# Patient Record
Sex: Female | Born: 1965
Health system: Southern US, Community
[De-identification: ages and names within clinical notes are randomized; demographics above are authoritative.]

## PROBLEM LIST (undated history)

## (undated) DIAGNOSIS — H4010X0 Unspecified open-angle glaucoma, stage unspecified: Secondary | ICD-10-CM

## (undated) DIAGNOSIS — I639 Cerebral infarction, unspecified: Secondary | ICD-10-CM

## (undated) DIAGNOSIS — E119 Type 2 diabetes mellitus without complications: Secondary | ICD-10-CM

## (undated) DIAGNOSIS — I10 Essential (primary) hypertension: Secondary | ICD-10-CM

## (undated) HISTORY — DX: Unspecified open-angle glaucoma, stage unspecified: H40.10X0

## (undated) HISTORY — DX: Cerebral infarction, unspecified: I63.9

## (undated) HISTORY — DX: Type 2 diabetes mellitus without complications: E11.9

## (undated) HISTORY — DX: Essential (primary) hypertension: I10

## (undated) HISTORY — PX: ABDOMINAL HYSTERECTOMY: SHX81

---

## 1997-10-29 ENCOUNTER — Other Ambulatory Visit: Admission: RE | Admit: 1997-10-29 | Discharge: 1997-10-29 | Payer: Self-pay | Admitting: Family Medicine

## 1998-09-18 ENCOUNTER — Encounter: Admission: RE | Admit: 1998-09-18 | Discharge: 1998-12-17 | Payer: Self-pay | Admitting: Emergency Medicine

## 2014-04-25 ENCOUNTER — Other Ambulatory Visit: Payer: Self-pay | Admitting: Family Medicine

## 2014-04-25 DIAGNOSIS — D259 Leiomyoma of uterus, unspecified: Secondary | ICD-10-CM

## 2014-04-30 ENCOUNTER — Ambulatory Visit
Admission: RE | Admit: 2014-04-30 | Discharge: 2014-04-30 | Disposition: A | Discharge status: Home / Self Care | Payer: 59 | Source: Ambulatory Visit | Attending: Family Medicine | Admitting: Family Medicine

## 2014-04-30 DIAGNOSIS — D259 Leiomyoma of uterus, unspecified: Secondary | ICD-10-CM

## 2014-04-30 IMAGING — US US TRANSVAGINAL NON-OB
1 series · 14 of 25 positions shown · non-contrast
Comparison: None

CLINICAL DATA: Uterine fibroids.  LMP [DATE].

EXAM:
TRANSABDOMINAL AND TRANSVAGINAL ULTRASOUND OF PELVIS
TECHNIQUE: Both transabdominal and transvaginal ultrasound examinations of the
pelvis were performed. Transabdominal technique was performed for
global imaging of the pelvis including uterus, ovaries, adnexal
regions, and pelvic cul-de-sac. It was necessary to proceed with
endovaginal exam following the transabdominal exam to visualize the
endometrium and ovaries.

[Series 1: us transvaginal non-ob · 0.27mm/px · 14 of 57 slices shown]
[im 1/57]
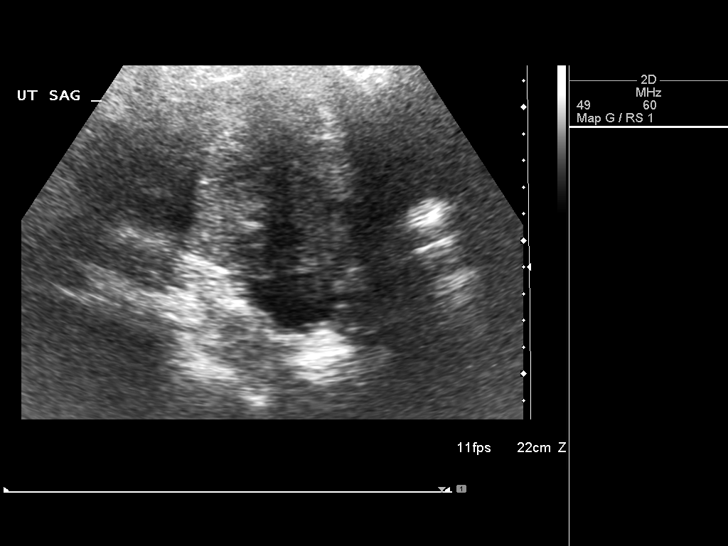
[im 5/57]
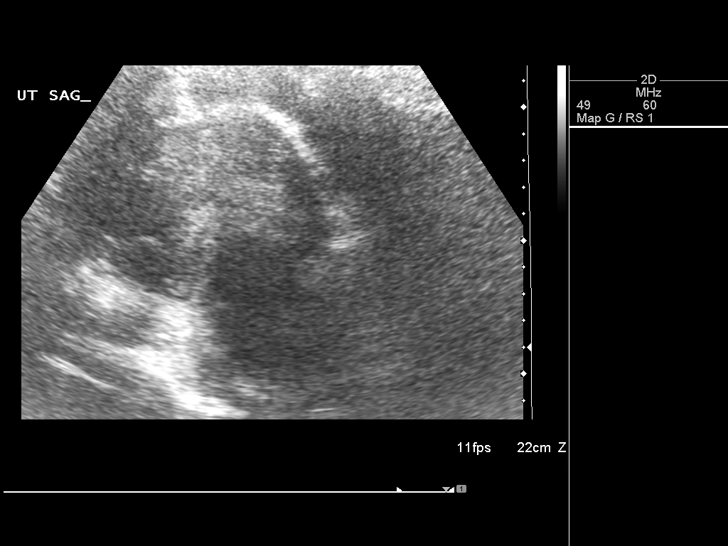
[im 10/57]
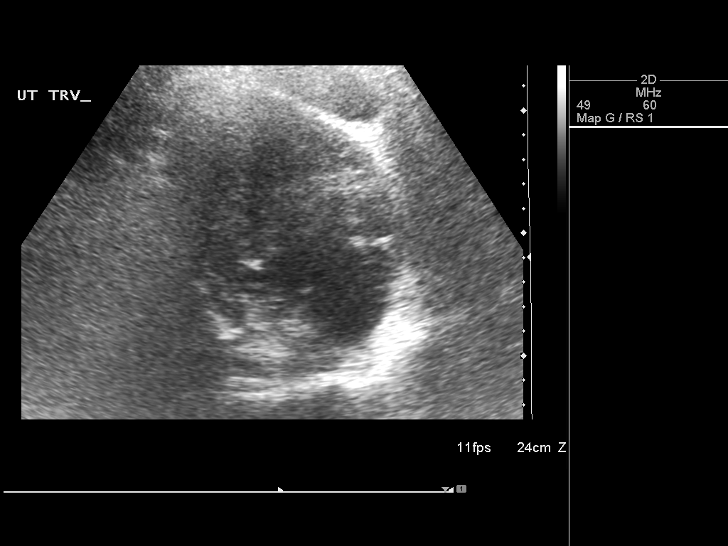
[im 15/57]
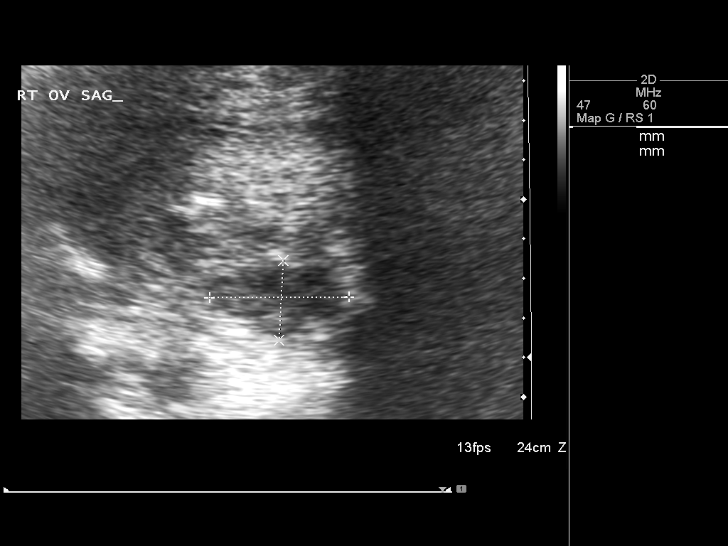
[im 19/57]
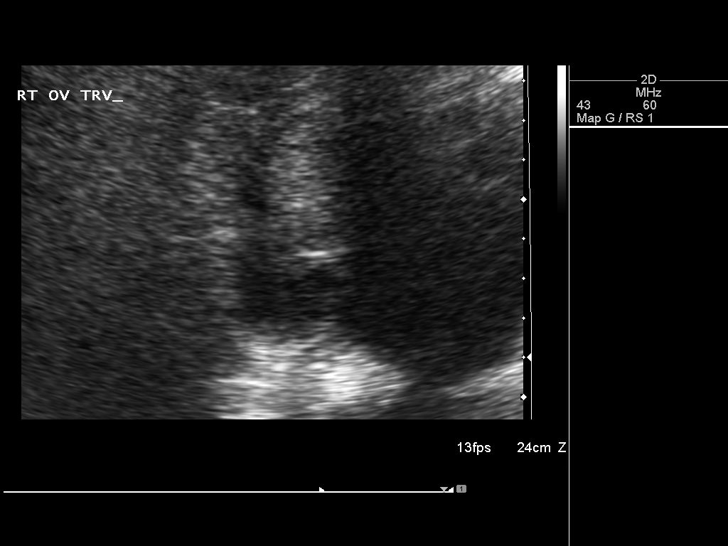
[im 22/57]
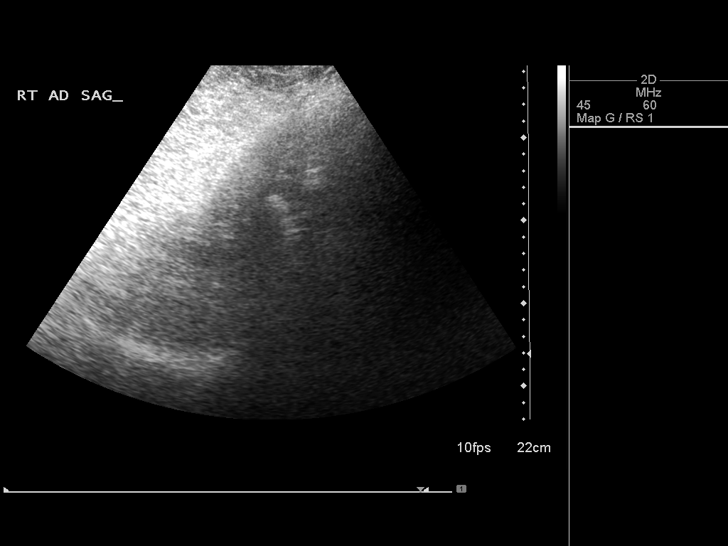
[im 26/57]
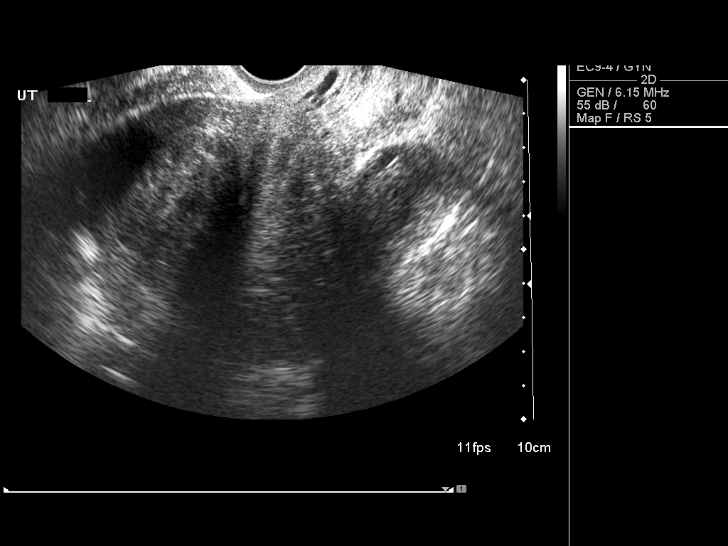
[im 31/57]
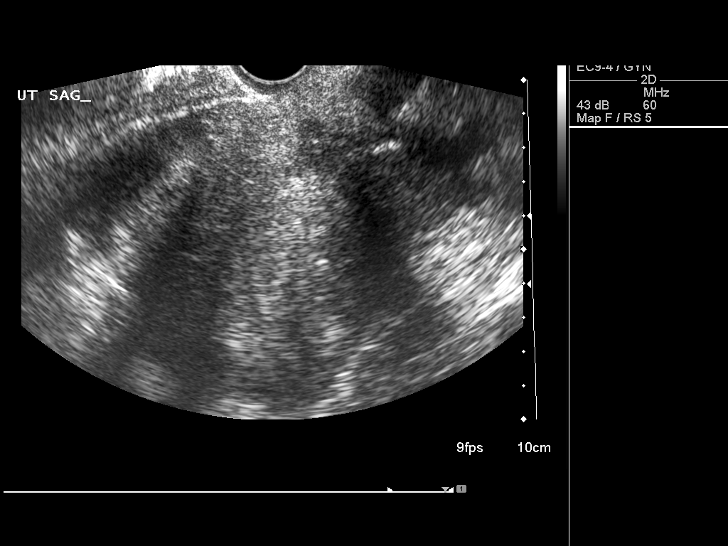
[im 36/57]
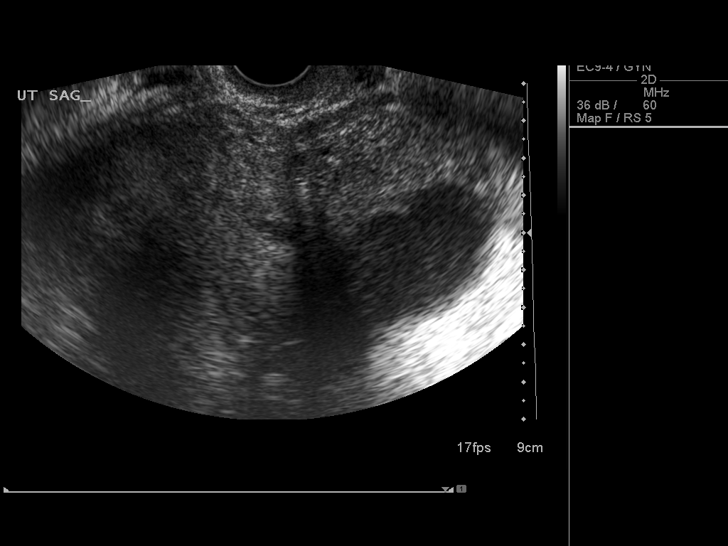
[im 38/57]
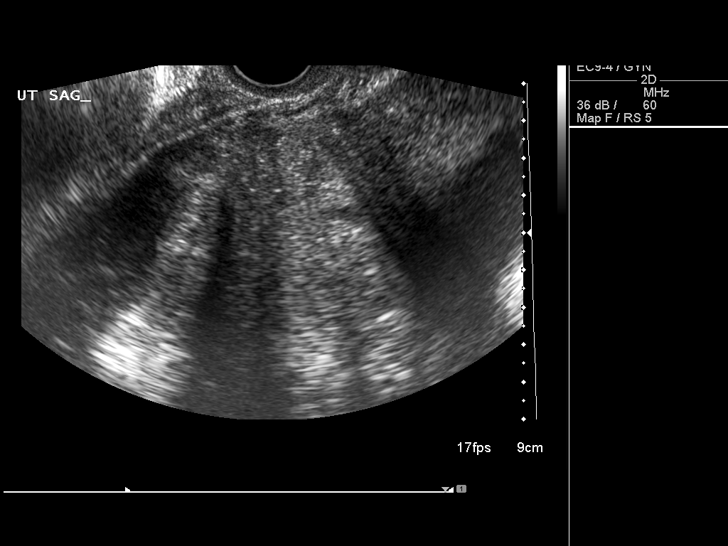
[im 43/57]
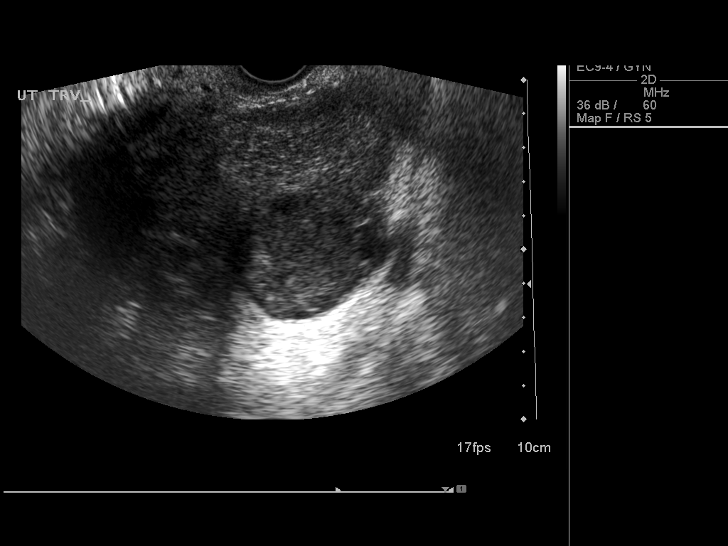
[im 47/57]
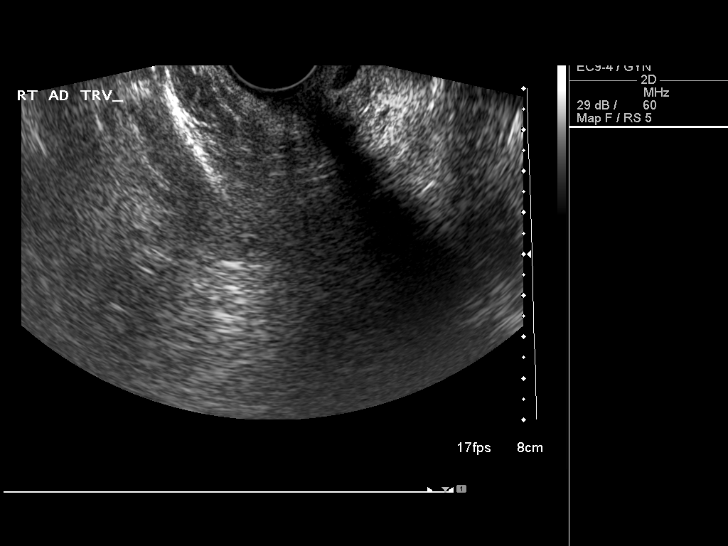
[im 52/57]
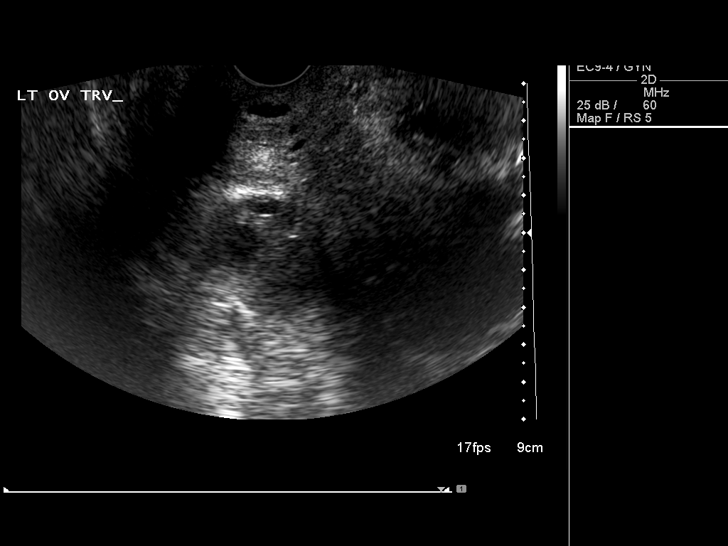
[im 57/57]
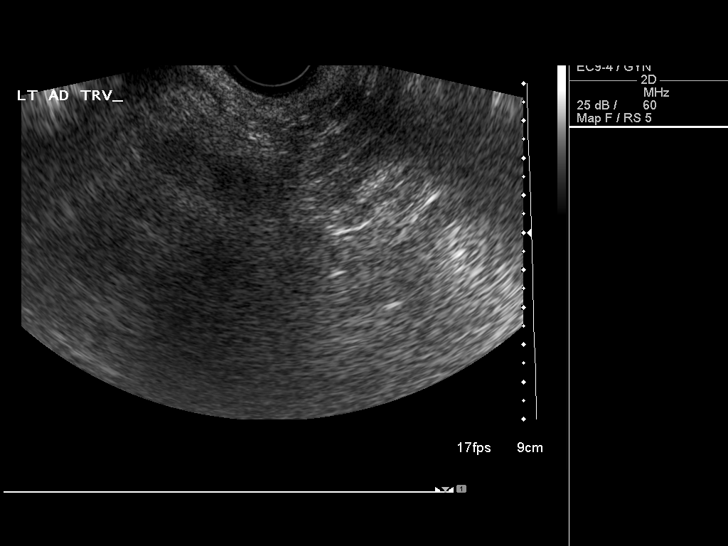

[14 of 25 positions shown; findings below may reference images not displayed]

FINDINGS: Uterus

Measurements: 11.5 x 8.5 x 9.3 cm. A large posterior uterine fibroid
with on area of cystic degeneration is seen in the posterior uterine
corpus which measures 8.3 x 6.5 x 7.8 cm. No other definite fibroids
identified.

Endometrium

Thickness: 9 mm.  No focal abnormality visualized.

Right ovary

Measurements: 3.5 x 2.0 x 2.6 cm. Normal appearance/no adnexal mass.

Left ovary

Measurements: 4.3 x 3.1 x 2.8 cm. Normal appearance/no adnexal mass.

Other findings

No free fluid.
IMPRESSION: Large posterior uterine fibroid measuring 8.3 cm.

Normal appearance of both ovaries.

## 2014-05-08 ENCOUNTER — Other Ambulatory Visit: Payer: Self-pay | Admitting: Family Medicine

## 2014-05-08 ENCOUNTER — Ambulatory Visit
Admission: RE | Admit: 2014-05-08 | Discharge: 2014-05-08 | Disposition: A | Discharge status: Home / Self Care | Payer: 59 | Source: Ambulatory Visit | Attending: Family Medicine | Admitting: Family Medicine

## 2014-05-08 DIAGNOSIS — R109 Unspecified abdominal pain: Secondary | ICD-10-CM

## 2014-05-08 IMAGING — CR DG ABDOMEN 1V
2 series · 2 of 2 positions shown · non-contrast
Comparison: None.

CLINICAL DATA: Left flank pain for 1 month, rule out kidney stone

EXAM:
ABDOMEN - 1 VIEW

[t abdomen supine (1 of 2)]
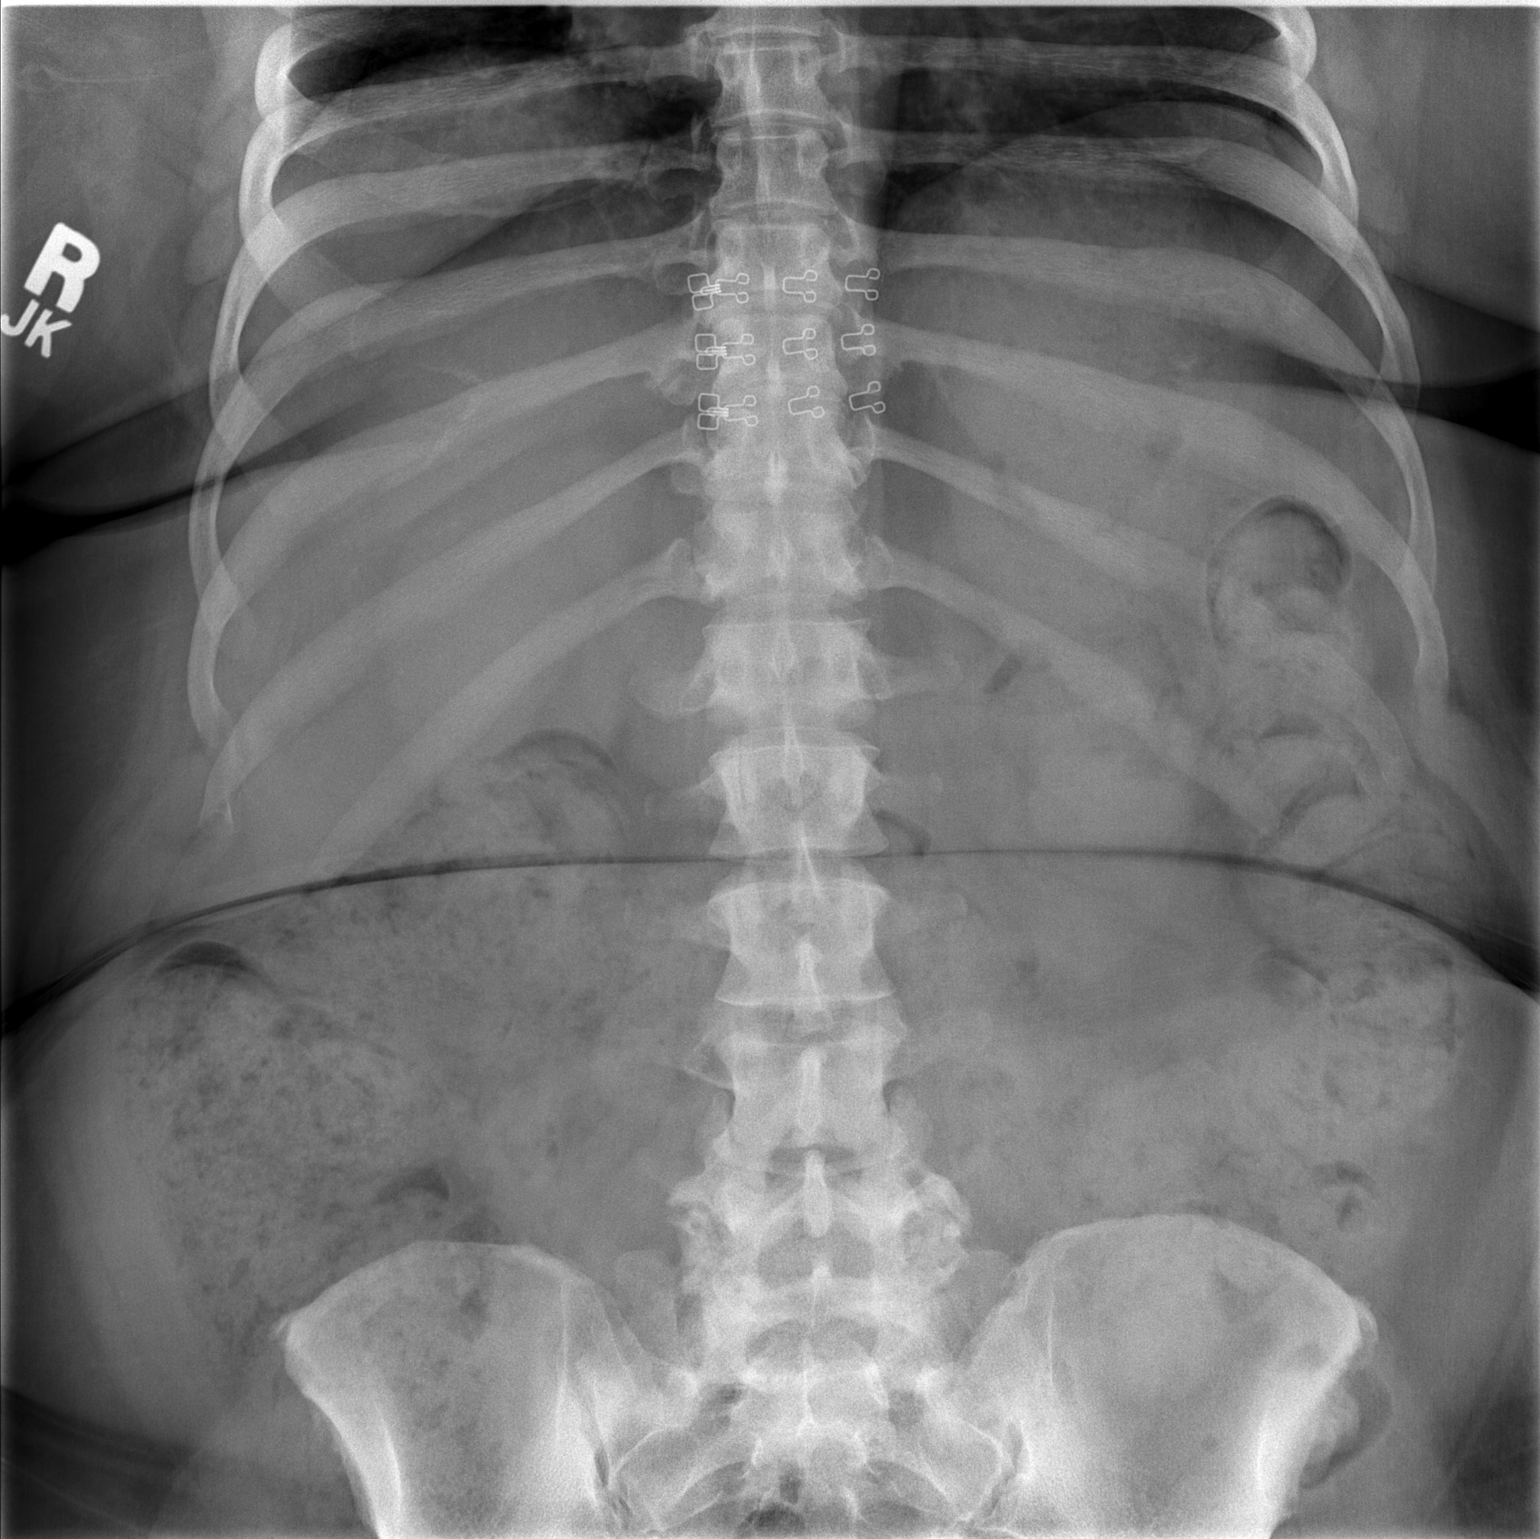

[t abdomen supine (2 of 2)]
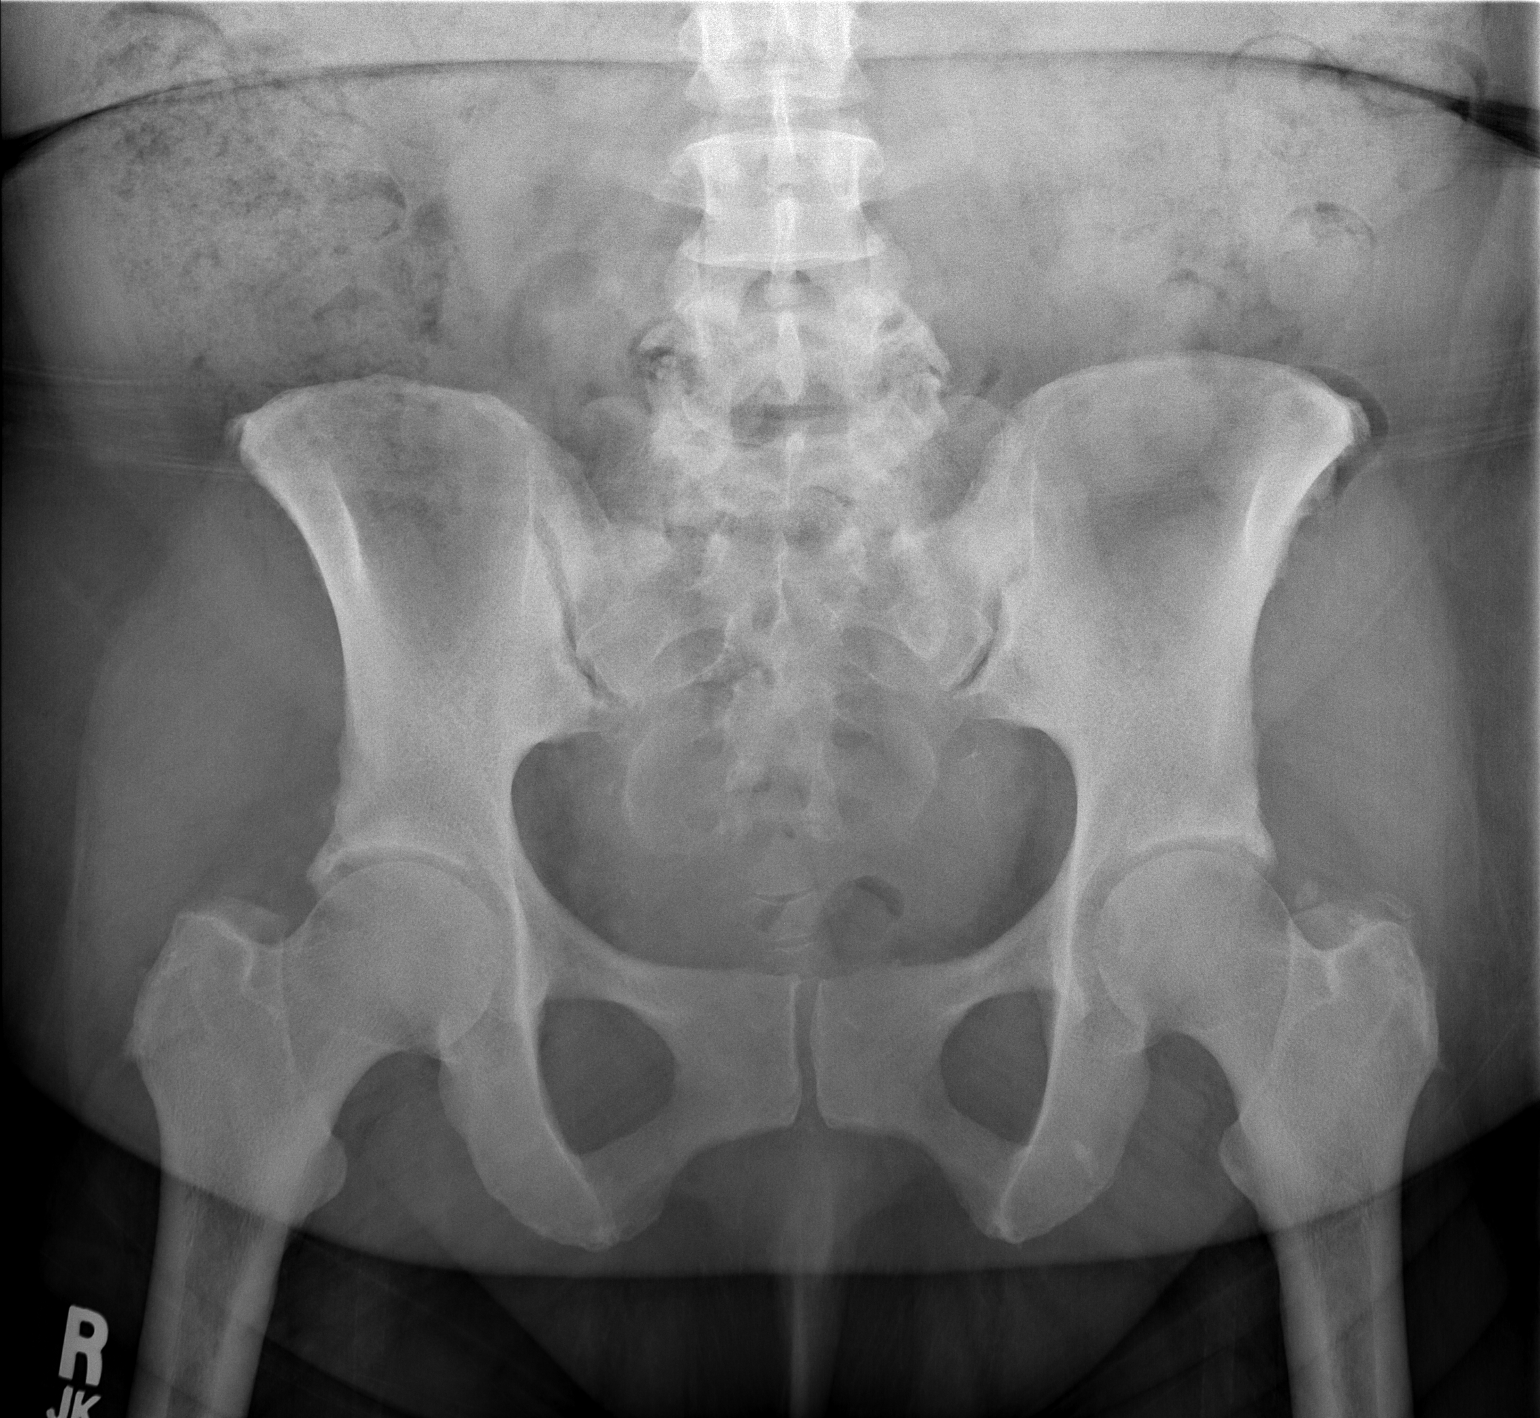

[2 of 2 positions shown; findings below may reference images not displayed]

FINDINGS: There is nonspecific nonobstructive bowel gas pattern. No definite
renal or ureteral calcifications. There is abundant stool throughout
the colon. Kidneys are partially obscured.
IMPRESSION: Nonspecific nonobstructive bowel gas pattern. Abundant stool
throughout the colon. No definite renal or ureteral calcifications.

## 2014-08-24 NOTE — Patient Instructions (Addendum)
   Your procedure is scheduled on:  Tuesday, May 31  Enter through the Main Entrance of [facility] at: 6 AM Pick up the phone at the desk and dial [phone] and inform us of your arrival.  Please call this number if you have any problems the morning of surgery: [phone]  Remember: Do not eat or drink after midnight: Monday Take these medicines the morning of surgery with a SIP OF WATER:   Cozaar and zyrtec if needed.  Do not take any diabetes medications on day of surgery.  Half you Monday night insulin dose to        .  We will check your sugar upon arrival to Short Stay on day of surgery.   Do not wear jewelry, make-up, or FINGER nail polish No metal in your hair or on your body. Do not wear lotions, powders, perfumes.  You may wear deodorant.  Do not bring valuables to the hospital. Contacts, dentures or bridgework may not be worn into surgery.  Leave suitcase in the car. After Surgery it may be brought to your room. For patients being admitted to the hospital, checkout time is 11:00am the day of discharge.

## 2014-08-27 ENCOUNTER — Inpatient Hospital Stay (HOSPITAL_COMMUNITY)
Admission: RE | Admit: 2014-08-27 | Discharge: 2014-08-27 | Disposition: A | Discharge status: Home / Self Care | Payer: 59 | Source: Ambulatory Visit

## 2014-09-03 ENCOUNTER — Encounter (HOSPITAL_COMMUNITY): Admission: RE | Payer: Self-pay | Source: Ambulatory Visit

## 2014-09-03 ENCOUNTER — Inpatient Hospital Stay (HOSPITAL_COMMUNITY): Admission: RE | Admit: 2014-09-03 | Payer: 59 | Source: Ambulatory Visit | Admitting: Obstetrics and Gynecology

## 2014-09-03 SURGERY — HYSTERECTOMY, ABDOMINAL
Anesthesia: Choice

## 2016-11-09 ENCOUNTER — Encounter: Payer: BLUE CROSS/BLUE SHIELD | Attending: Internal Medicine | Admitting: Registered"

## 2016-11-09 ENCOUNTER — Encounter: Payer: Self-pay | Admitting: Registered"

## 2016-11-09 DIAGNOSIS — I10 Essential (primary) hypertension: Secondary | ICD-10-CM | POA: Insufficient documentation

## 2016-11-09 DIAGNOSIS — E118 Type 2 diabetes mellitus with unspecified complications: Secondary | ICD-10-CM

## 2016-11-09 DIAGNOSIS — E119 Type 2 diabetes mellitus without complications: Secondary | ICD-10-CM | POA: Diagnosis not present

## 2016-11-09 DIAGNOSIS — Z713 Dietary counseling and surveillance: Secondary | ICD-10-CM | POA: Diagnosis present

## 2016-11-09 NOTE — Progress Notes (Signed)
Diabetes Self-Management Education  Visit Type: First/Initial  Appt. Start Time: 0930 Appt. End Time: 1055  11/12/2016  Ms. Sharon Blankenship, identified by name and date of birth, is a 51 y.o. female with a diagnosis of Diabetes: Type 2.   ASSESSMENT Pt states she gave up Dr. Malachi Bonds and trying to follow low carb/low fat diet. 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 Pt states she lives with sister and they have family dinner on Sundays. Pt reports she can't digest fat when eating too much at time.  Weight: pt states she has always been larger wearing sizes 12-20. Pt states on her dad's side of the family the women have larger bodies.   Patient is taking Insulin & Trulicy; pt states metformin caused side effects.  Pt states it is hard to be active because her feet stay swollen and it is painful. Pt states she thinks medication may be playing a part in it.     Diabetes Self-Management Education - 11/09/16 0944      Visit Information   Visit Type First/Initial     Initial Visit   Diabetes Type Type 2   Are you currently following a meal plan? Yes   What type of meal plan do you follow? low fat/low carb/low sugar   Are you taking your medications as prescribed? No  takes DM meds   Date Diagnosed at 23 years     Health Coping   How would you rate your overall health? Good     Psychosocial Assessment   Patient Belief/Attitude about Diabetes Defeat/Burnout   How often do you need to have someone help you when you read instructions, pamphlets, or other written materials from your doctor or pharmacy? 1 - Never   What is the last grade level you completed in school? vocational, some college     Complications   Last HgB A1C per patient/outside source 8.9 %  per patient   How often do you check your blood sugar? 1-2 times/day   Have you had a dilated eye exam in the past 12 months? No   Have you had a dental exam in the past 12  months? No   Are you checking your feet? Yes   How many days per week are you checking your feet? 6     Dietary Intake   Breakfast lemon water and cereal OR fruit, ww whole, fruit, grits   Snack (morning) chips  ("worst enemy")   Lunch chicken or fish, eating out rotisserie chicken, greens   Snack (afternoon) nuts, popcorn, dried fruit used ,    Dinner fast food chick fil-a, chicken sandwhich, unsweet, waffle fries    Snack (evening) fruit, popcorn, sugar free popsciles, lemonade w equal   Beverage(s) water, unsweet tea     Exercise   How many days per week to you exercise? 3   How many minutes per day do you exercise? 30   Total minutes per week of exercise 90     Patient Education   Previous Diabetes Education Yes (please comment)  years ago   Disease state  Factors that contribute to the development of diabetes   Nutrition management  Role of diet in the treatment of diabetes and the relationship between the three main macronutrients and blood glucose level     Individualized Goals (developed by patient)   Nutrition General guidelines for healthy choices and portions discussed  Outcomes   Expected Outcomes Demonstrated interest in learning. Expect positive outcomes   Future DMSE 4-6 wks   Program Status Not Completed    Individualized Plan for Diabetes Self-Management Training:   Learning Objective:  Patient will have a greater understanding of diabetes self-management. Patient education plan is to attend individual and/or group sessions per assessed needs and concerns.  Patient Instructions  Consider using Fairlife milk on your Cheerios Aim to eat balanced meals Documentary "Embrace" is a great take on body image Consider including more snacks during the day, use the snack sheet  Expected Outcomes:  Demonstrated interest in learning. Expect positive outcomes  Education material provided: My Plate and Snack sheet, PCOS Tips & Resources, what is inositol?  If  problems or questions, patient to contact team via:  Phone and  MyChart  Future DSME appointment: 4-6 wks

## 2016-11-09 NOTE — Patient Instructions (Signed)
Consider using Fairlife milk on your Cheerios Aim to eat balanced meals Documentary "Embrace" is a great take on body image Consider including more snacks during the day, use the snack sheet

## 2016-11-12 ENCOUNTER — Encounter: Payer: Self-pay | Admitting: Registered"

## 2016-12-14 ENCOUNTER — Ambulatory Visit: Payer: BLUE CROSS/BLUE SHIELD | Admitting: Registered"

## 2018-06-21 ENCOUNTER — Encounter: Payer: Self-pay | Admitting: Gastroenterology

## 2020-01-18 ENCOUNTER — Emergency Department (HOSPITAL_COMMUNITY)
Admission: EM | Admit: 2020-01-18 | Discharge: 2020-01-19 | Disposition: A | Discharge status: Home / Self Care | Payer: BLUE CROSS/BLUE SHIELD | Attending: Emergency Medicine | Admitting: Emergency Medicine

## 2020-01-18 ENCOUNTER — Other Ambulatory Visit: Payer: Self-pay

## 2020-01-18 ENCOUNTER — Encounter (HOSPITAL_COMMUNITY): Payer: Self-pay | Admitting: Emergency Medicine

## 2020-01-18 DIAGNOSIS — H5712 Ocular pain, left eye: Secondary | ICD-10-CM | POA: Insufficient documentation

## 2020-01-18 DIAGNOSIS — R519 Headache, unspecified: Secondary | ICD-10-CM | POA: Diagnosis not present

## 2020-01-18 DIAGNOSIS — Z5321 Procedure and treatment not carried out due to patient leaving prior to being seen by health care provider: Secondary | ICD-10-CM | POA: Insufficient documentation

## 2020-01-18 NOTE — ED Triage Notes (Addendum)
Pt to ED with c/o not being able to see out of left eye.  Pt sts she was at urgent care yesterday and dx with sinus infection and given Rx  Pt st's she woke up today and cant see out of left eye. Also c/o headache

## 2020-01-18 NOTE — ED Notes (Signed)
No answer for vitals x3 and not visible in lobby

## 2020-02-19 LAB — HM DIABETES EYE EXAM

## 2020-03-21 ENCOUNTER — Emergency Department (HOSPITAL_COMMUNITY): Payer: BLUE CROSS/BLUE SHIELD

## 2020-03-21 ENCOUNTER — Inpatient Hospital Stay (HOSPITAL_COMMUNITY)
Admission: EM | Admit: 2020-03-21 | Discharge: 2020-03-23 | DRG: 065 | Disposition: A | Discharge status: Home Health | Payer: BLUE CROSS/BLUE SHIELD | Attending: Internal Medicine | Admitting: Internal Medicine

## 2020-03-21 ENCOUNTER — Other Ambulatory Visit: Payer: Self-pay

## 2020-03-21 DIAGNOSIS — Z79899 Other long term (current) drug therapy: Secondary | ICD-10-CM

## 2020-03-21 DIAGNOSIS — Z20822 Contact with and (suspected) exposure to covid-19: Secondary | ICD-10-CM | POA: Diagnosis present

## 2020-03-21 DIAGNOSIS — G8191 Hemiplegia, unspecified affecting right dominant side: Secondary | ICD-10-CM | POA: Diagnosis present

## 2020-03-21 DIAGNOSIS — I639 Cerebral infarction, unspecified: Secondary | ICD-10-CM | POA: Diagnosis present

## 2020-03-21 DIAGNOSIS — R279 Unspecified lack of coordination: Secondary | ICD-10-CM | POA: Diagnosis present

## 2020-03-21 DIAGNOSIS — R29898 Other symptoms and signs involving the musculoskeletal system: Secondary | ICD-10-CM

## 2020-03-21 DIAGNOSIS — Z6839 Body mass index (BMI) 39.0-39.9, adult: Secondary | ICD-10-CM | POA: Diagnosis not present

## 2020-03-21 DIAGNOSIS — R29701 NIHSS score 1: Secondary | ICD-10-CM | POA: Diagnosis present

## 2020-03-21 DIAGNOSIS — R269 Unspecified abnormalities of gait and mobility: Secondary | ICD-10-CM | POA: Diagnosis present

## 2020-03-21 DIAGNOSIS — E872 Acidosis: Secondary | ICD-10-CM | POA: Diagnosis present

## 2020-03-21 DIAGNOSIS — E876 Hypokalemia: Secondary | ICD-10-CM | POA: Diagnosis present

## 2020-03-21 DIAGNOSIS — I63522 Cerebral infarction due to unspecified occlusion or stenosis of left anterior cerebral artery: Secondary | ICD-10-CM | POA: Diagnosis present

## 2020-03-21 DIAGNOSIS — I10 Essential (primary) hypertension: Secondary | ICD-10-CM | POA: Diagnosis present

## 2020-03-21 DIAGNOSIS — E860 Dehydration: Secondary | ICD-10-CM | POA: Diagnosis present

## 2020-03-21 DIAGNOSIS — I959 Hypotension, unspecified: Secondary | ICD-10-CM | POA: Diagnosis present

## 2020-03-21 DIAGNOSIS — H402290 Chronic angle-closure glaucoma, unspecified eye, stage unspecified: Secondary | ICD-10-CM | POA: Diagnosis present

## 2020-03-21 DIAGNOSIS — T502X5A Adverse effect of carbonic-anhydrase inhibitors, benzothiadiazides and other diuretics, initial encounter: Secondary | ICD-10-CM | POA: Diagnosis present

## 2020-03-21 DIAGNOSIS — Z823 Family history of stroke: Secondary | ICD-10-CM

## 2020-03-21 DIAGNOSIS — R4781 Slurred speech: Secondary | ICD-10-CM | POA: Diagnosis present

## 2020-03-21 DIAGNOSIS — Z7984 Long term (current) use of oral hypoglycemic drugs: Secondary | ICD-10-CM

## 2020-03-21 DIAGNOSIS — Z8249 Family history of ischemic heart disease and other diseases of the circulatory system: Secondary | ICD-10-CM | POA: Diagnosis not present

## 2020-03-21 DIAGNOSIS — I6389 Other cerebral infarction: Secondary | ICD-10-CM | POA: Diagnosis not present

## 2020-03-21 DIAGNOSIS — I672 Cerebral atherosclerosis: Secondary | ICD-10-CM | POA: Diagnosis present

## 2020-03-21 DIAGNOSIS — I63412 Cerebral infarction due to embolism of left middle cerebral artery: Secondary | ICD-10-CM | POA: Diagnosis not present

## 2020-03-21 DIAGNOSIS — Z90710 Acquired absence of both cervix and uterus: Secondary | ICD-10-CM

## 2020-03-21 DIAGNOSIS — Z880 Allergy status to penicillin: Secondary | ICD-10-CM

## 2020-03-21 DIAGNOSIS — E785 Hyperlipidemia, unspecified: Secondary | ICD-10-CM | POA: Diagnosis present

## 2020-03-21 DIAGNOSIS — Z833 Family history of diabetes mellitus: Secondary | ICD-10-CM

## 2020-03-21 DIAGNOSIS — N179 Acute kidney failure, unspecified: Secondary | ICD-10-CM | POA: Diagnosis present

## 2020-03-21 DIAGNOSIS — E119 Type 2 diabetes mellitus without complications: Secondary | ICD-10-CM | POA: Diagnosis present

## 2020-03-21 DIAGNOSIS — I63512 Cerebral infarction due to unspecified occlusion or stenosis of left middle cerebral artery: Secondary | ICD-10-CM | POA: Diagnosis not present

## 2020-03-21 DIAGNOSIS — Z794 Long term (current) use of insulin: Secondary | ICD-10-CM | POA: Diagnosis not present

## 2020-03-21 LAB — HEMOGLOBIN A1C
Hgb A1c MFr Bld: 6.6 % — ABNORMAL HIGH (ref 4.8–5.6)
Mean Plasma Glucose: 142.72 mg/dL

## 2020-03-21 LAB — LIPID PANEL
Cholesterol: 222 mg/dL — ABNORMAL HIGH (ref 0–200)
HDL: 34 mg/dL — ABNORMAL LOW (ref >40)
LDL Cholesterol: 159 mg/dL — ABNORMAL HIGH (ref 0–99)
Total CHOL/HDL Ratio: 6.5 RATIO
Triglycerides: 144 mg/dL (ref <150)
VLDL: 29 mg/dL (ref 0–40)

## 2020-03-21 LAB — CBC
HCT: 32.7 % — ABNORMAL LOW (ref 36.0–46.0)
Hemoglobin: 11 g/dL — ABNORMAL LOW (ref 12.0–15.0)
MCH: 27.2 pg (ref 26.0–34.0)
MCHC: 33.6 g/dL (ref 30.0–36.0)
MCV: 80.9 fL (ref 80.0–100.0)
Platelets: 313 10*3/uL (ref 150–400)
RBC: 4.04 MIL/uL (ref 3.87–5.11)
RDW: 15.1 % (ref 11.5–15.5)
WBC: 7.6 10*3/uL (ref 4.0–10.5)
nRBC: 0 % (ref 0.0–0.2)

## 2020-03-21 LAB — TROPONIN I (HIGH SENSITIVITY): Troponin I (High Sensitivity): 4 ng/L (ref <18)

## 2020-03-21 LAB — RESP PANEL BY RT-PCR (FLU A&B, COVID) ARPGX2
Influenza A by PCR: NEGATIVE
Influenza B by PCR: NEGATIVE
SARS Coronavirus 2 by RT PCR: NEGATIVE

## 2020-03-21 LAB — BASIC METABOLIC PANEL
Anion gap: 14 (ref 5–15)
BUN: 22 mg/dL — ABNORMAL HIGH (ref 6–20)
CO2: 13 mmol/L — ABNORMAL LOW (ref 22–32)
Calcium: 9.6 mg/dL (ref 8.9–10.3)
Chloride: 114 mmol/L — ABNORMAL HIGH (ref 98–111)
Creatinine, Ser: 1.47 mg/dL — ABNORMAL HIGH (ref 0.44–1.00)
GFR, Estimated: 43 mL/min — ABNORMAL LOW (ref >60)
Glucose, Bld: 98 mg/dL (ref 70–99)
Potassium: 3.4 mmol/L — ABNORMAL LOW (ref 3.5–5.1)
Sodium: 141 mmol/L (ref 135–145)

## 2020-03-21 LAB — HEPATIC FUNCTION PANEL
ALT: 61 U/L — ABNORMAL HIGH (ref 0–44)
AST: 29 U/L (ref 15–41)
Albumin: 3.7 g/dL (ref 3.5–5.0)
Alkaline Phosphatase: 136 U/L — ABNORMAL HIGH (ref 38–126)
Bilirubin, Direct: <0.1 mg/dL (ref 0.0–0.2)
Total Bilirubin: 0.9 mg/dL (ref 0.3–1.2)
Total Protein: 7.6 g/dL (ref 6.5–8.1)

## 2020-03-21 LAB — CBG MONITORING, ED: Glucose-Capillary: 92 mg/dL (ref 70–99)

## 2020-03-21 LAB — BRAIN NATRIURETIC PEPTIDE: B Natriuretic Peptide: 12.7 pg/mL (ref 0.0–100.0)

## 2020-03-21 IMAGING — MR MR MRA HEAD W/O CM
5 series · 39 of 48 positions shown · IV contrast (gadavist)
Comparison: Head CT same day

CLINICAL DATA: Right worse than left weakness. Left frontal stroke
question by CT.

EXAM:
MRI HEAD WITHOUT AND WITH CONTRAST
MRA HEAD WITHOUT CONTRAST
MRA NECK WITHOUT AND WITH CONTRAST
TECHNIQUE: Multiplanar, multiecho pulse sequences of the brain and surrounding
structures were obtained without and with intravenous contrast.
Angiographic images of the Circle of Willis were obtained using MRA
technique without intravenous contrast. Angiographic images of the
neck were obtained using MRA technique without and with intravenous
contrast. Carotid stenosis measurements (when applicable) are
obtained utilizing NASCET criteria, using the distal internal
carotid diameter as the denominator.
CONTRAST:  10mL GADAVIST GADOBUTROL 1 MMOL/ML IV SOLN

[Series 1: aahead_scout · sagittal · 1.6mm · 1.62mm/px · 19 of 128 slices shown]
[im 1/128]
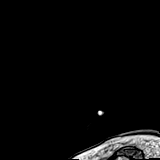
[im 8/128]
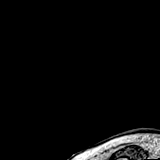
[im 15/128]
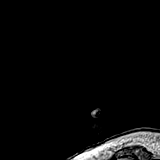
[im 22/128]
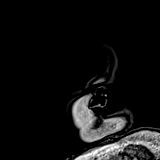
[im 29/128]
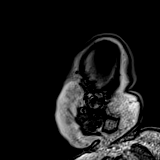
[im 36/128]
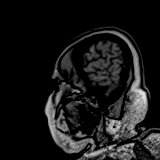
[im 43/128]
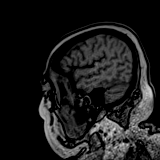
[im 50/128]
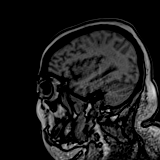
[im 57/128]
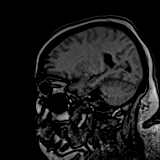
[im 64/128]
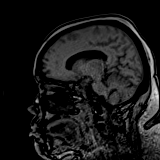
[im 71/128]
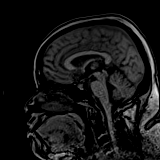
[im 78/128]
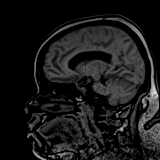
[im 85/128]
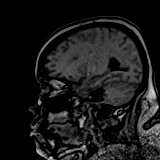
[im 92/128]
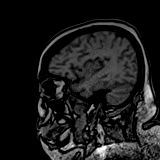
[im 99/128]
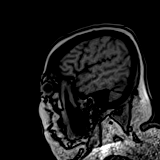
[im 106/128]
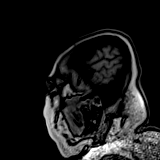
[im 113/128]
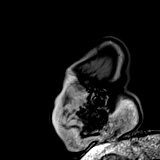
[im 120/128]
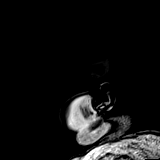
[im 128/128]
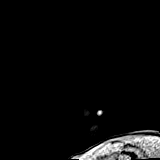

[Series 2: aahead_scout_mpr_sag · sagittal · 1.6mm · 1.60mm/px · 1 of 5 slices shown]
[im 1/5]
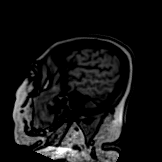

[Series 3: aahead_scout_mpr_cor · coronal · 1.6mm · 1.60mm/px · 1 of 3 slices shown]
[im 1/3]
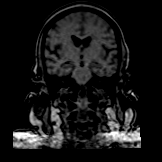

[Series 4: aahead_scout_mpr_tra · axial · 1.6mm · 1.60mm/px · 1 of 3 slices shown]
[im 1/3]
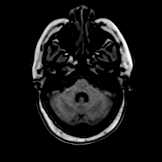

[Series 5: 3d cow · axial · 0.5mm · 0.41mm/px · z∈[-43,+38]mm · 17 of 172 slices shown]
[im 1/172]
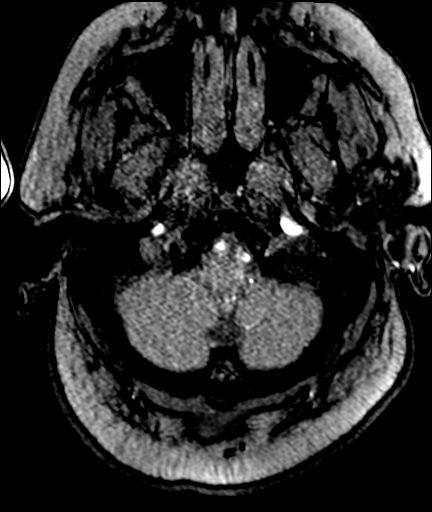
[im 7/172]
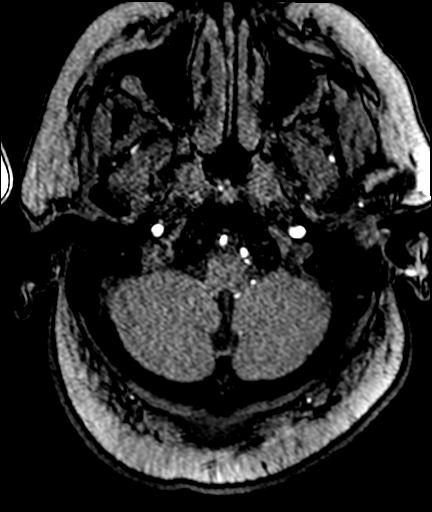
[im 14/172]
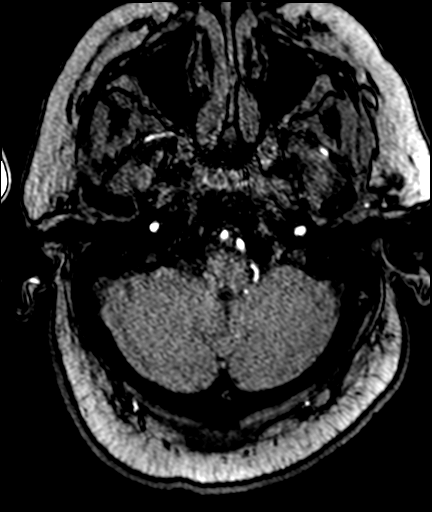
[im 21/172]
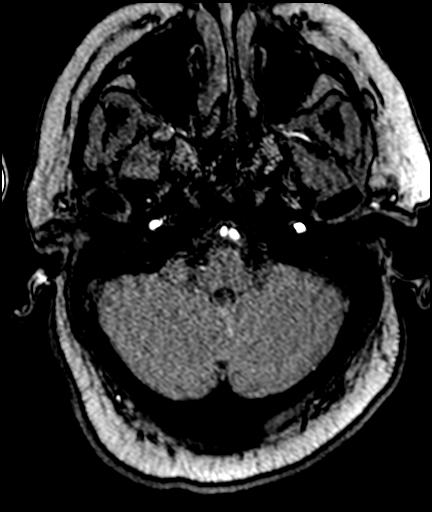
[im 28/172]
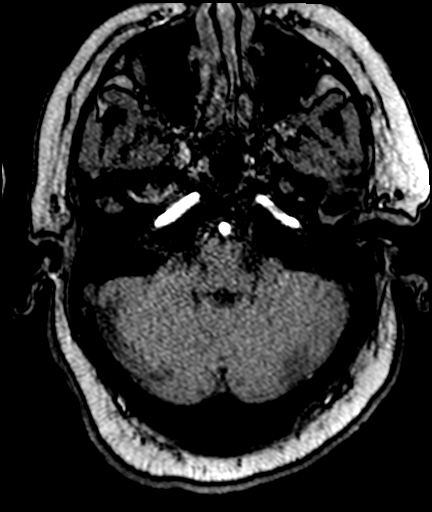
[im 35/172]
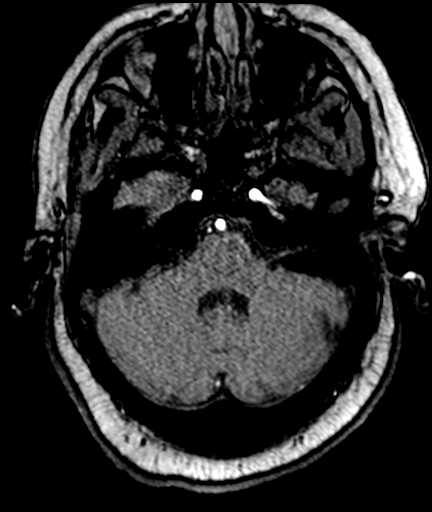
[im 42/172]
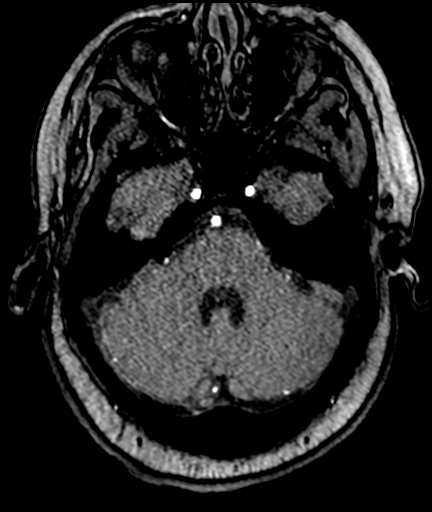
[im 48/172]
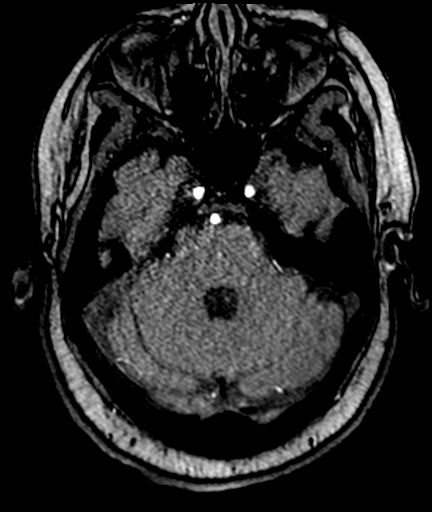
[im 55/172]
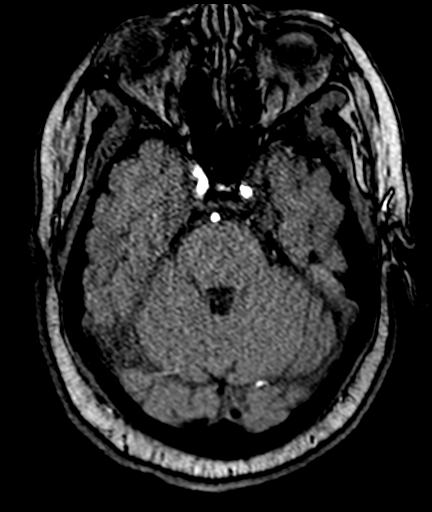
[im 62/172]
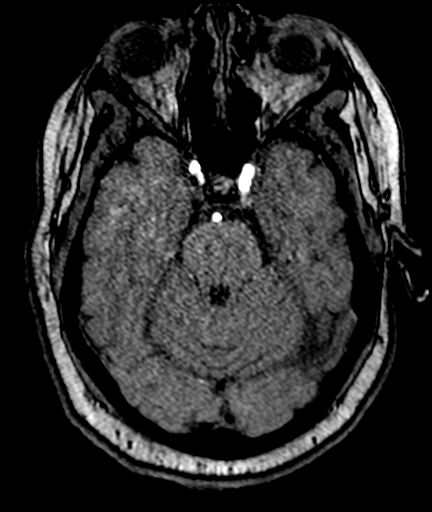
[im 69/172]
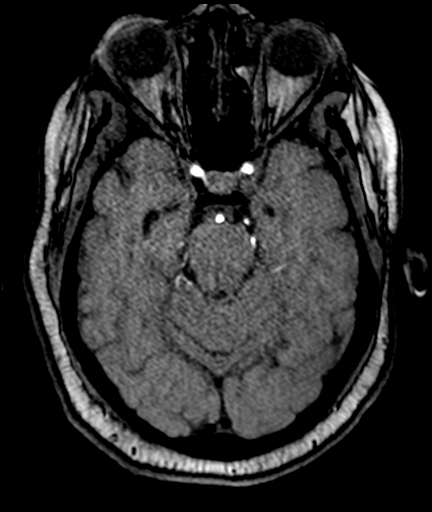
[im 76/172]
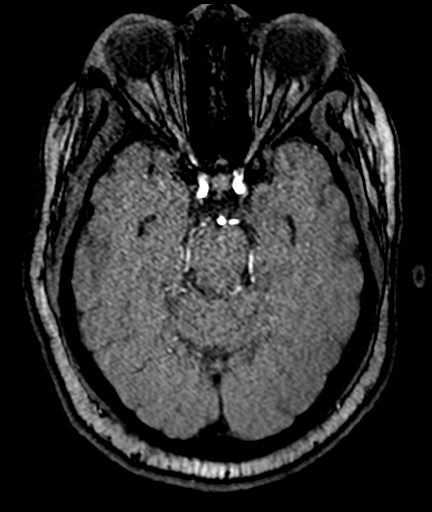
[im 83/172]
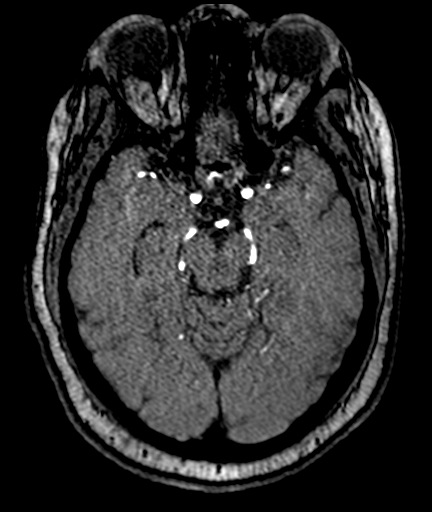
[im 96/172]
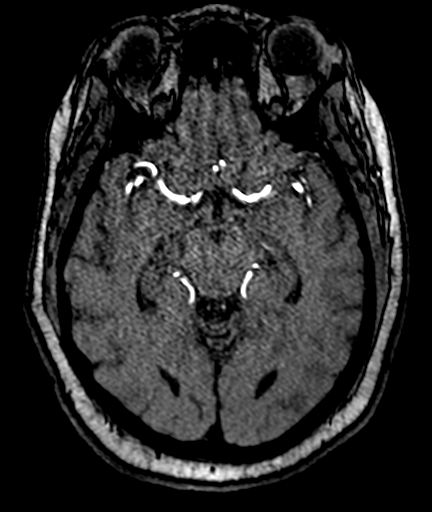
[im 117/172]
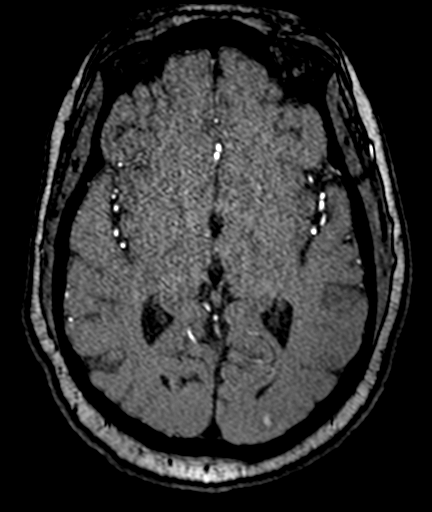
[im 144/172]
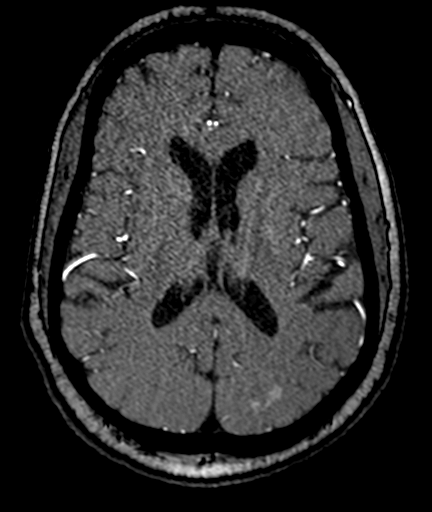
[im 165/172]
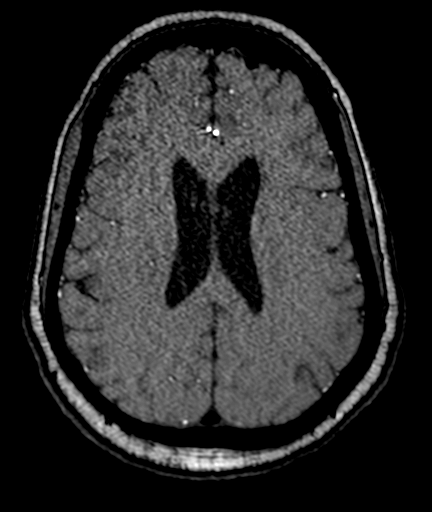

[39 of 48 positions shown; findings below may reference images not displayed]

FINDINGS: MRI HEAD FINDINGS

Brain: Diffusion imaging shows numerous acute infarctions affecting
the cortical and subcortical brain extending from front to back on
the left consistent with watershed distribution infarctions. No
evidence of mass effect or detectable hemorrhage. No acute
infarction in the right hemisphere, brainstem or cerebellum. Mild
chronic small-vessel change affects the hemispheric white matter. No
hydrocephalus or extra-axial collection.

Vascular: Major vessels at the base of the brain show flow.

Skull and upper cervical spine: Negative

Sinuses/Orbits: Clear/normal

Other: None

MRA HEAD FINDINGS

Both internal carotid arteries are patent through the skull base and
siphon regions. On the right, the anterior and middle cerebral
vessels are patent. Question mild stenosis of the proximal M2
branches on the right. On the left, the anterior cerebral artery is
widely patent. The left M1 segment is widely patent. There is severe
narrowing of the proximal M2 branches on the left. This could be due
to plaque or nonocclusive embolus.

Both vertebral arteries widely patent to the basilar. No basilar
stenosis. Stenotic change of the right superior cerebellar artery.
Left superior cerebellar artery is normal. Both posterior cerebral
arteries are patent. Some atherosclerotic irregularity of more
distal branch vessels, right more than left.

MRA NECK FINDINGS

Branching pattern from the arch is normal. Common origin of the
innominate artery and left common carotid artery. Both common
carotid arteries are widely patent to the bifurcation. Minimal
atherosclerotic change at both carotid bifurcations but no stenosis.
Both cervical internal carotid arteries are normal.

Both vertebral arteries are widely patent through the cervical
region to the basilar.
IMPRESSION: 1. Numerous acute infarctions affecting the cortical and subcortical
brain extending from front to back on the left at the junction of
the ACA and MCA territories consistent with watershed distribution
infarctions. No evidence of mass effect or hemorrhage.
2. Severe narrowing of the proximal M2 branch vessels on the left,
which could be due to plaque or nonocclusive embolus.
3. Intracranial MR angiography of the neck vessels shows only
minimal atherosclerotic change at the carotid bifurcations without
flow limiting stenosis or pronounced irregularity.
4. Mild atherosclerotic narrowing of the proximal M2 branches on the
right. Atherosclerotic narrowing of the right superior cerebellar
artery. Mild atherosclerotic irregularity of the more distal PCA
branches right more than left.

## 2020-03-21 IMAGING — CR DG CHEST 2V
2 series · 2 of 2 positions shown · non-contrast
Comparison: None.

CLINICAL DATA: shortness of breath

EXAM:
CHEST - 2 VIEW

[chest pa]
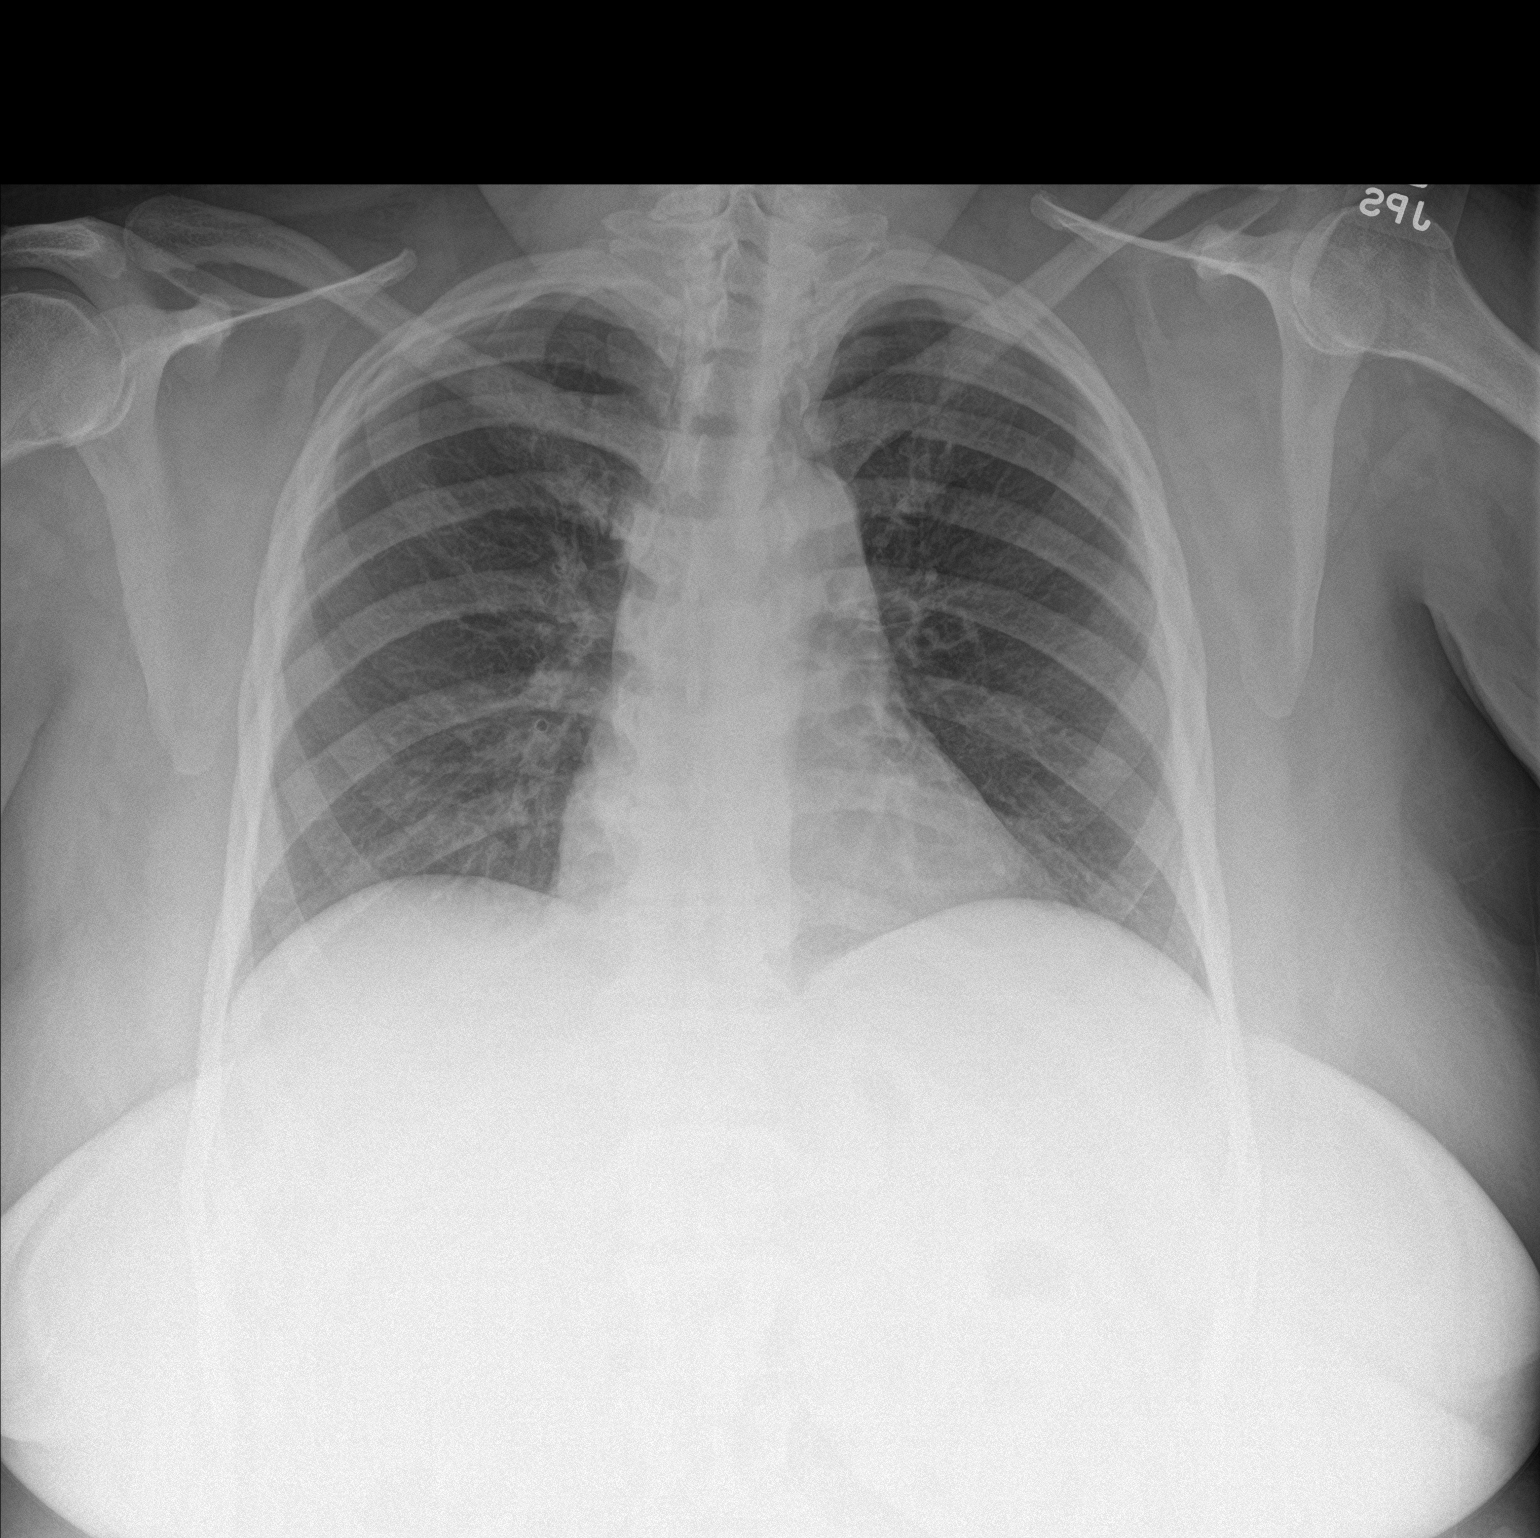

[chest lat]
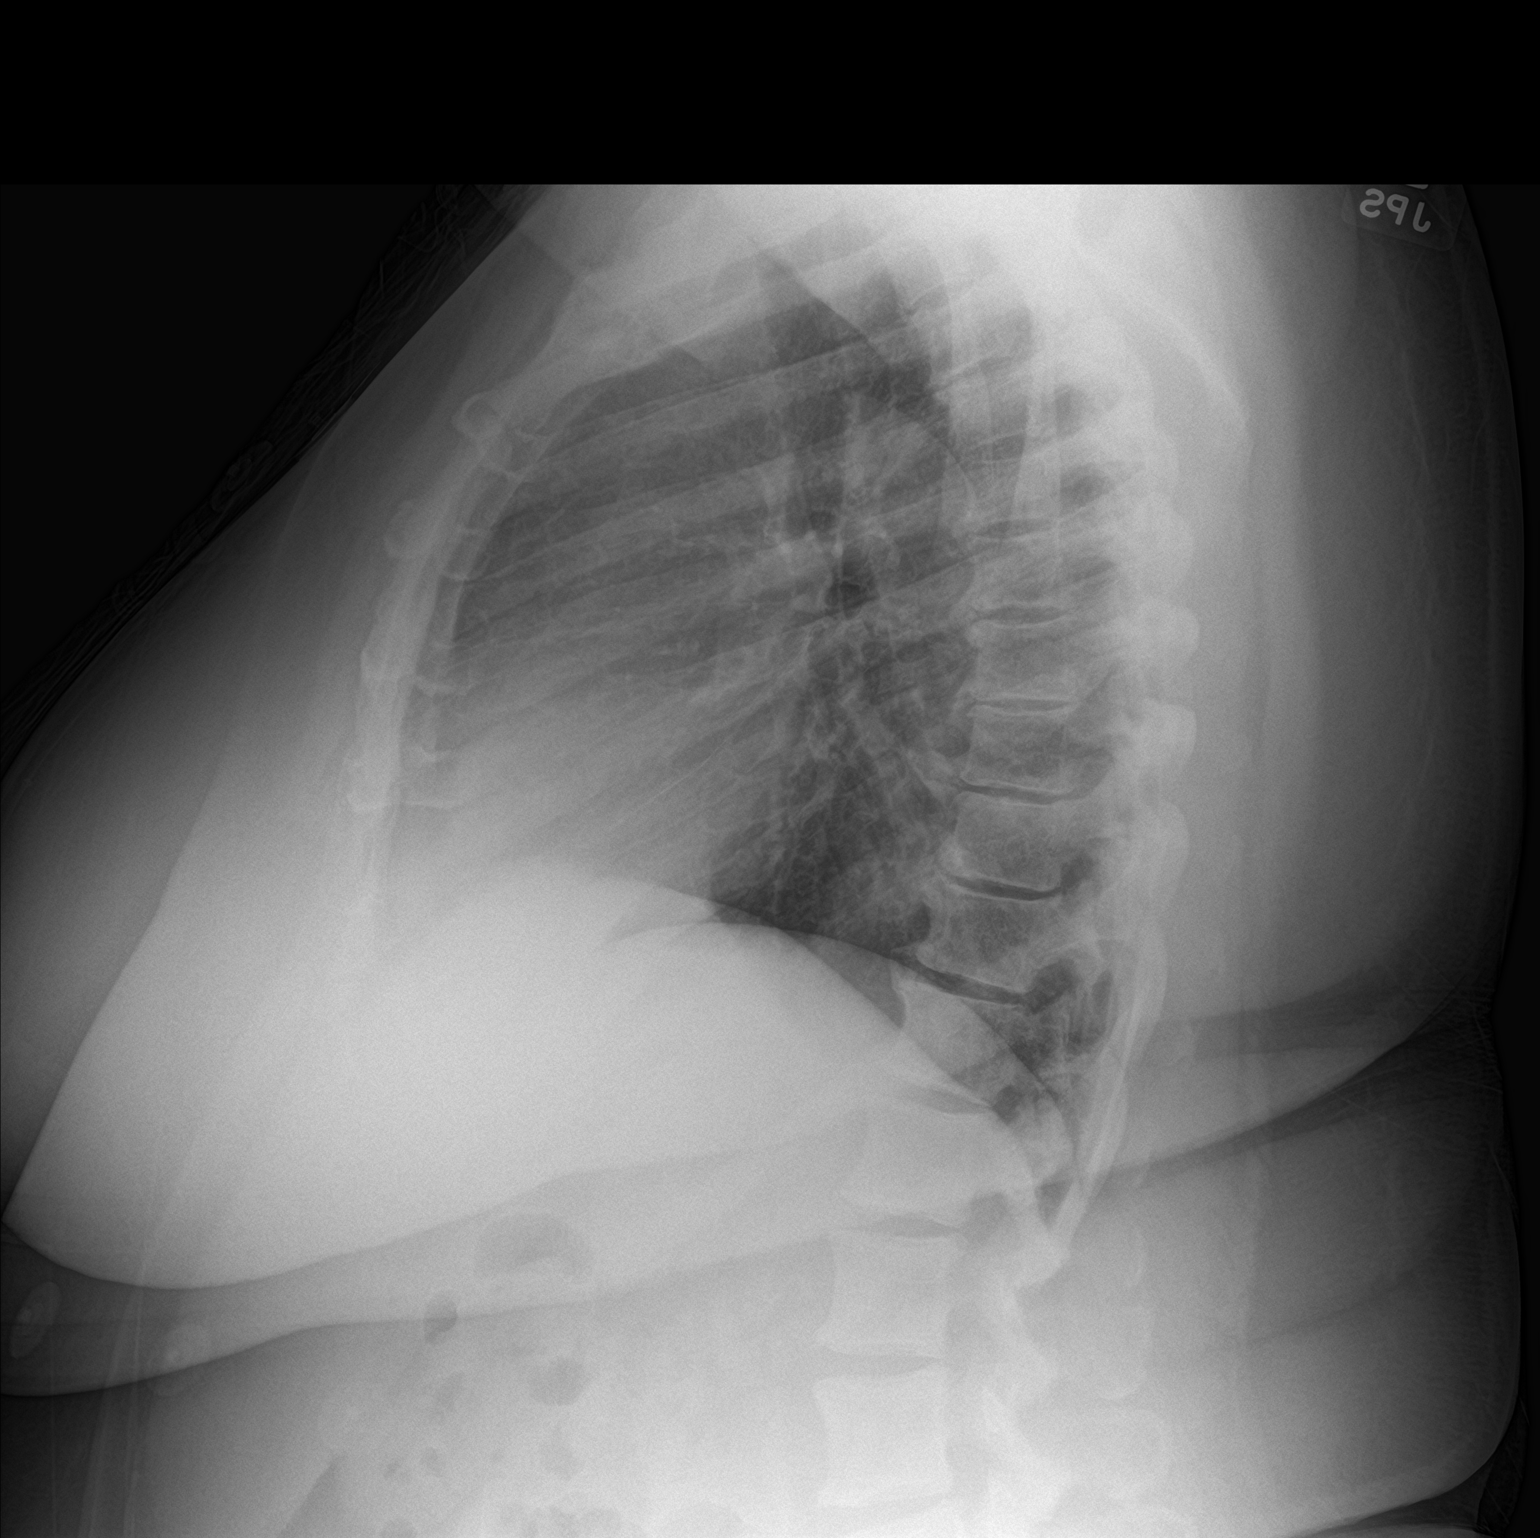

[2 of 2 positions shown; findings below may reference images not displayed]

FINDINGS: Mild hypoinflation. Patchy retrocardiac opacities. No pneumothorax
or pleural effusion. Cardiomediastinal silhouette is within normal
limits. Multilevel spondylosis.
IMPRESSION: Patchy retrocardiac opacities, atelectasis versus infiltrate.

Mild hypoinflation.

## 2020-03-21 IMAGING — MR MR MRA NECK WO/W CM
7 of 8 series · 40 of 48 positions shown · IV contrast (gadavist)
Comparison: Head CT same day

CLINICAL DATA: Right worse than left weakness. Left frontal stroke
question by CT.

EXAM:
MRI HEAD WITHOUT AND WITH CONTRAST
MRA HEAD WITHOUT CONTRAST
MRA NECK WITHOUT AND WITH CONTRAST
TECHNIQUE: Multiplanar, multiecho pulse sequences of the brain and surrounding
structures were obtained without and with intravenous contrast.
Angiographic images of the Circle of Willis were obtained using MRA
technique without intravenous contrast. Angiographic images of the
neck were obtained using MRA technique without and with intravenous
contrast. Carotid stenosis measurements (when applicable) are
obtained utilizing NASCET criteria, using the distal internal
carotid diameter as the denominator.
CONTRAST:  10mL GADAVIST GADOBUTROL 1 MMOL/ML IV SOLN

[Series 14: tof_fl3d_tra_iso · axial · 0.6mm · 0.52mm/px · z∈[-143,-57]mm · 8 of 146 slices shown]
[im 1/146]
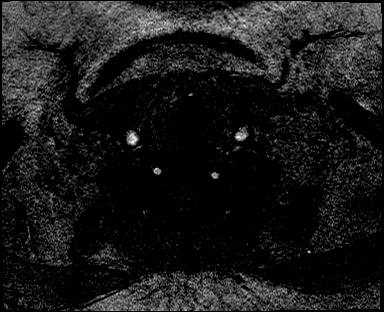
[im 21/146]
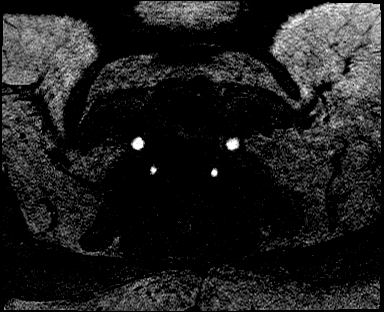
[im 42/146]
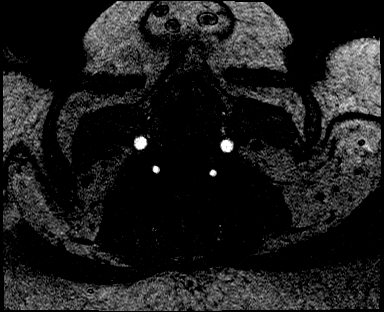
[im 63/146]
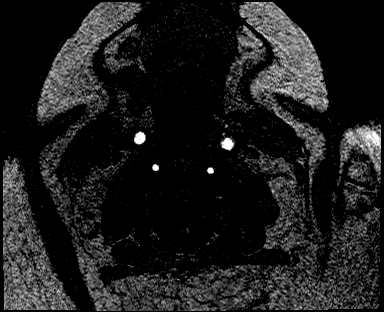
[im 83/146]
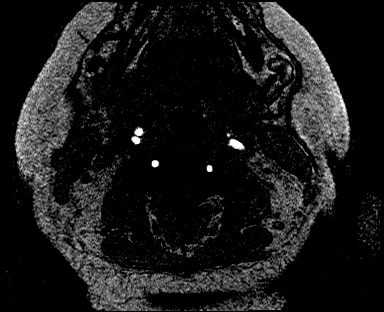
[im 104/146]
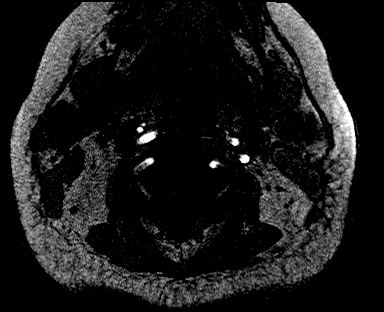
[im 125/146]
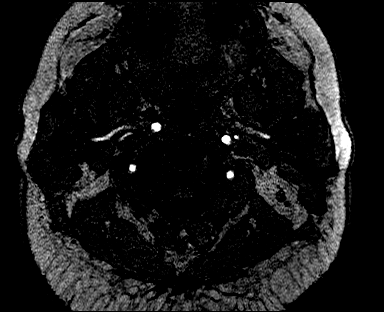
[im 146/146]
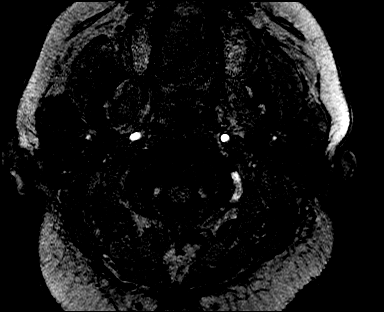

[Series 18: angio_fl3d_cor_pre_ttc=3.0s · coronal · 0.9mm · 0.85mm/px · 5 of 88 slices shown]
[im 1/88]
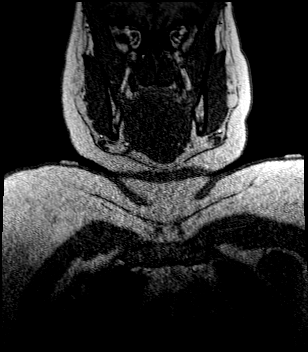
[im 22/88]
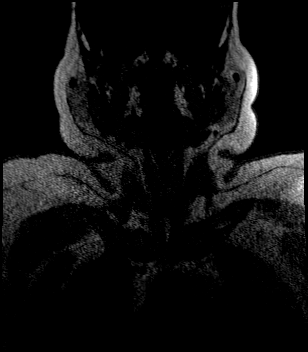
[im 44/88]
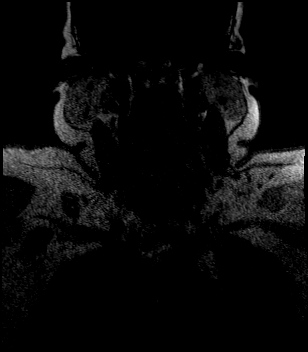
[im 66/88]
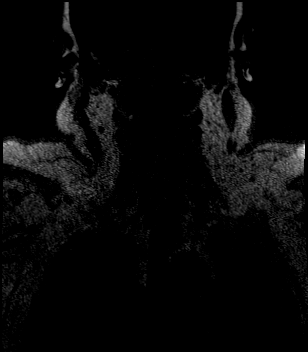
[im 88/88]
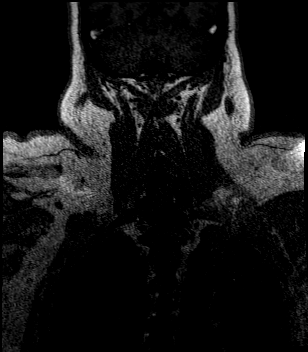

[Series 20: angio_fl3d_cor_post_ttc=3.0s · coronal · 0.9mm · 0.85mm/px · 5 of 88 slices shown (1 of 2)]
[im 1/88]
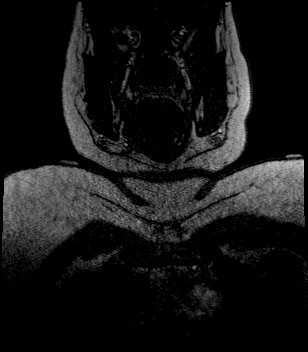
[im 22/88]
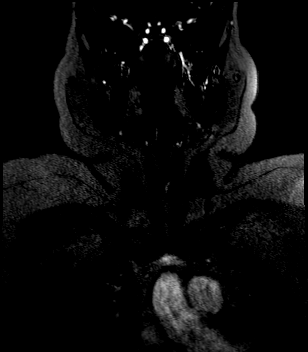
[im 44/88]
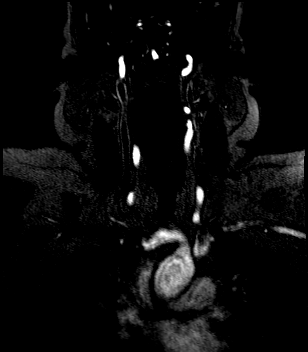
[im 66/88]
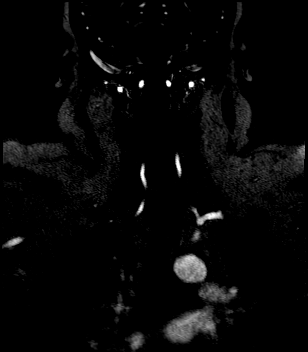
[im 88/88]
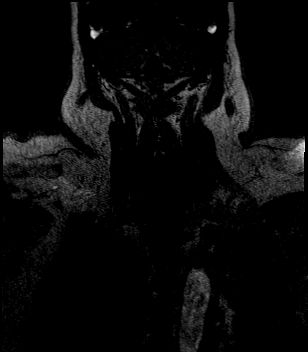

[Series 21: angio_fl3d_cor_post_ttc=3.0s_moco-adv · coronal · 0.9mm · 0.85mm/px · 6 of 88 slices shown (1 of 2)]
[im 1/88]
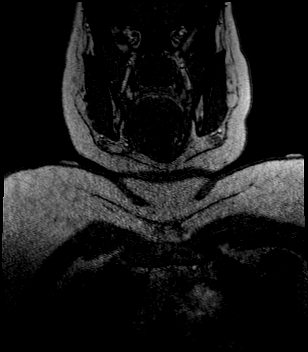
[im 18/88]
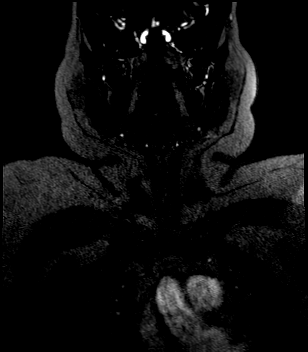
[im 35/88]
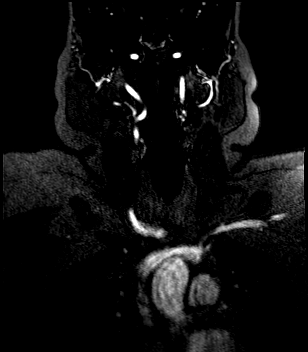
[im 53/88]
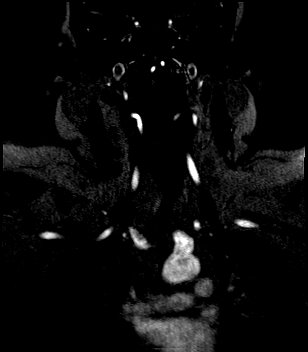
[im 70/88]
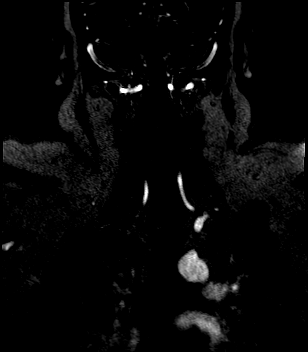
[im 88/88]
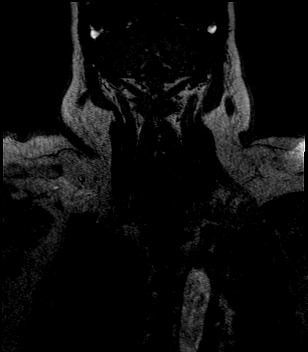

[Series 22: angio_fl3d_cor_post_ttc=3.0s_moco-adv_sub · coronal · 0.9mm · 0.85mm/px · 6 of 88 slices shown]
[im 1/88]
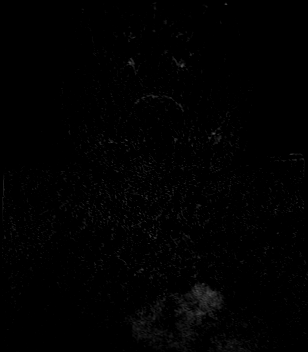
[im 18/88]
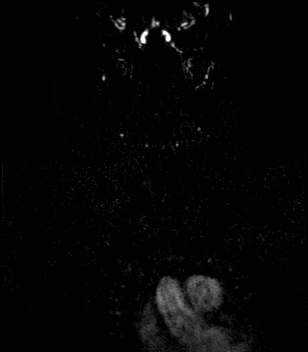
[im 35/88]
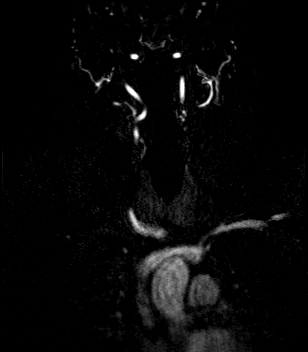
[im 53/88]
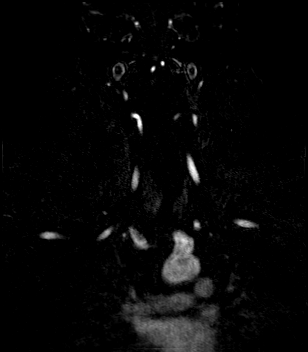
[im 70/88]
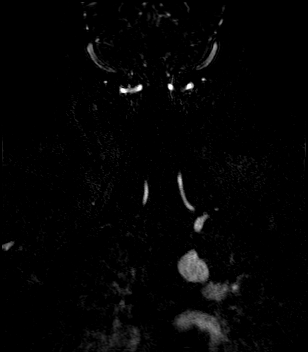
[im 88/88]
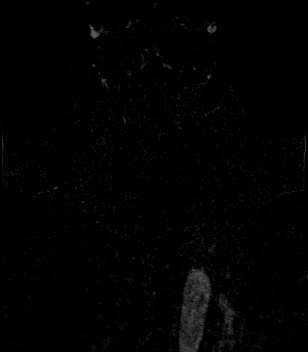

[Series 24: angio_fl3d_cor_post_ttc=3.0s · coronal · 0.9mm · 0.85mm/px · 6 of 88 slices shown (2 of 2)]
[im 1/88]
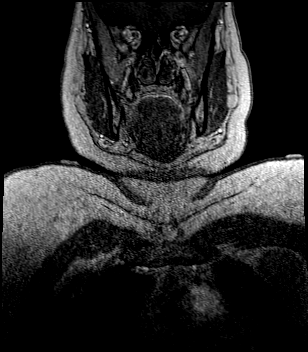
[im 18/88]
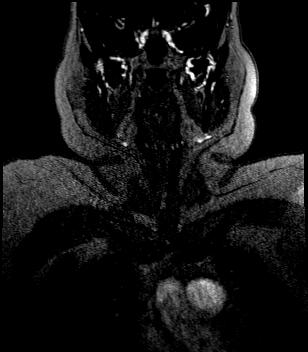
[im 35/88]
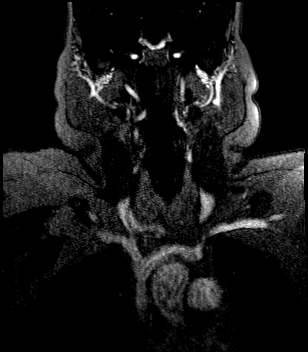
[im 53/88]
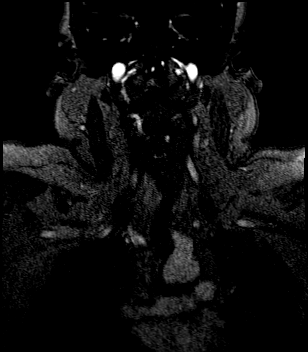
[im 70/88]
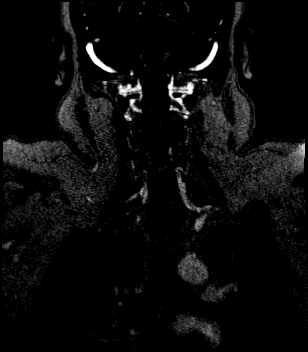
[im 88/88]
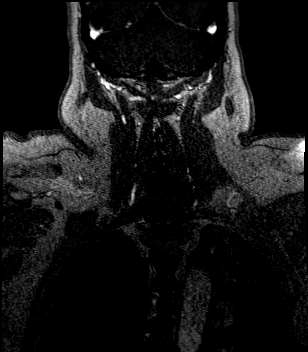

[Series 25: angio_fl3d_cor_post_ttc=3.0s_moco-adv · coronal · 0.9mm · 0.85mm/px · 4 of 88 slices shown (2 of 2)]
[im 1/88]
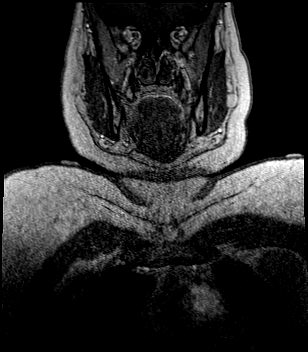
[im 18/88]
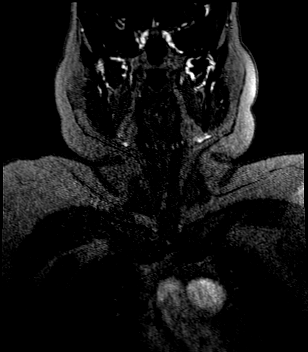
[im 35/88]
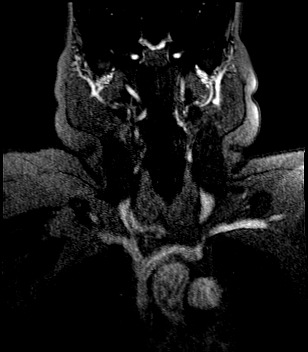
[im 53/88]
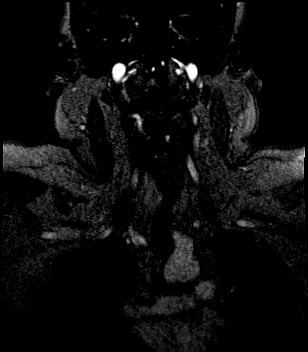

[40 of 48 positions shown; findings below may reference images not displayed]

FINDINGS: MRI HEAD FINDINGS

Brain: Diffusion imaging shows numerous acute infarctions affecting
the cortical and subcortical brain extending from front to back on
the left consistent with watershed distribution infarctions. No
evidence of mass effect or detectable hemorrhage. No acute
infarction in the right hemisphere, brainstem or cerebellum. Mild
chronic small-vessel change affects the hemispheric white matter. No
hydrocephalus or extra-axial collection.

Vascular: Major vessels at the base of the brain show flow.

Skull and upper cervical spine: Negative

Sinuses/Orbits: Clear/normal

Other: None

MRA HEAD FINDINGS

Both internal carotid arteries are patent through the skull base and
siphon regions. On the right, the anterior and middle cerebral
vessels are patent. Question mild stenosis of the proximal M2
branches on the right. On the left, the anterior cerebral artery is
widely patent. The left M1 segment is widely patent. There is severe
narrowing of the proximal M2 branches on the left. This could be due
to plaque or nonocclusive embolus.

Both vertebral arteries widely patent to the basilar. No basilar
stenosis. Stenotic change of the right superior cerebellar artery.
Left superior cerebellar artery is normal. Both posterior cerebral
arteries are patent. Some atherosclerotic irregularity of more
distal branch vessels, right more than left.

MRA NECK FINDINGS

Branching pattern from the arch is normal. Common origin of the
innominate artery and left common carotid artery. Both common
carotid arteries are widely patent to the bifurcation. Minimal
atherosclerotic change at both carotid bifurcations but no stenosis.
Both cervical internal carotid arteries are normal.

Both vertebral arteries are widely patent through the cervical
region to the basilar.
IMPRESSION: 1. Numerous acute infarctions affecting the cortical and subcortical
brain extending from front to back on the left at the junction of
the ACA and MCA territories consistent with watershed distribution
infarctions. No evidence of mass effect or hemorrhage.
2. Severe narrowing of the proximal M2 branch vessels on the left,
which could be due to plaque or nonocclusive embolus.
3. Intracranial MR angiography of the neck vessels shows only
minimal atherosclerotic change at the carotid bifurcations without
flow limiting stenosis or pronounced irregularity.
4. Mild atherosclerotic narrowing of the proximal M2 branches on the
right. Atherosclerotic narrowing of the right superior cerebellar
artery. Mild atherosclerotic irregularity of the more distal PCA
branches right more than left.

## 2020-03-21 IMAGING — CT CT HEAD W/O CM
4 series · 15 of 47 positions shown, 17 images · non-contrast
Comparison: None.

CLINICAL DATA: Right greater than left-sided weakness for the past
3 days.

EXAM:
CT HEAD WITHOUT CONTRAST
TECHNIQUE: Contiguous axial images were obtained from the base of the skull
through the vertex without intravenous contrast.

[Series 3: head wo · axial · 0.36mm/px · z∈[-53,+67]mm · 7 of 33 slices shown, 9 images]
[im 5/33  brain]
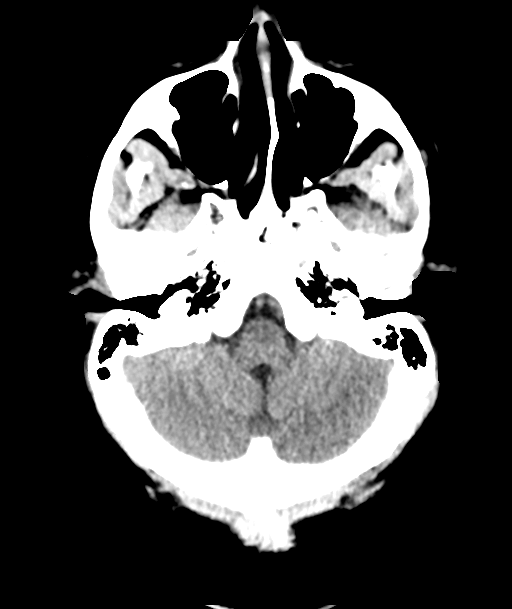
[im 5/33  bone]
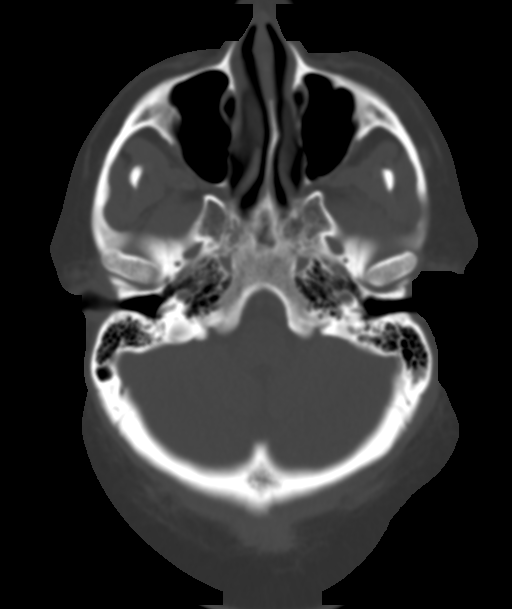
[im 9/33  brain]
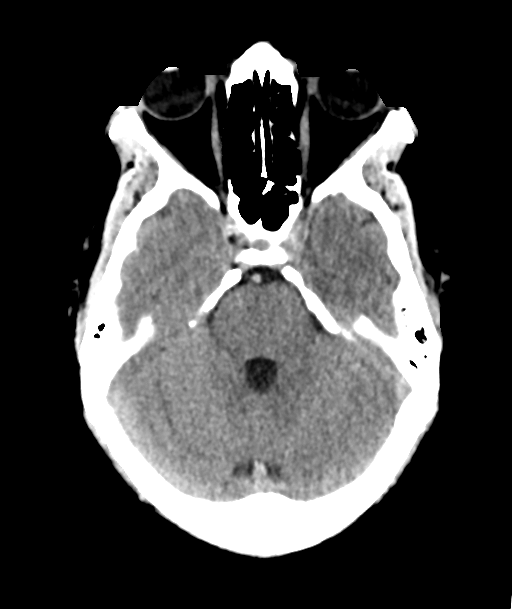
[im 13/33  brain]
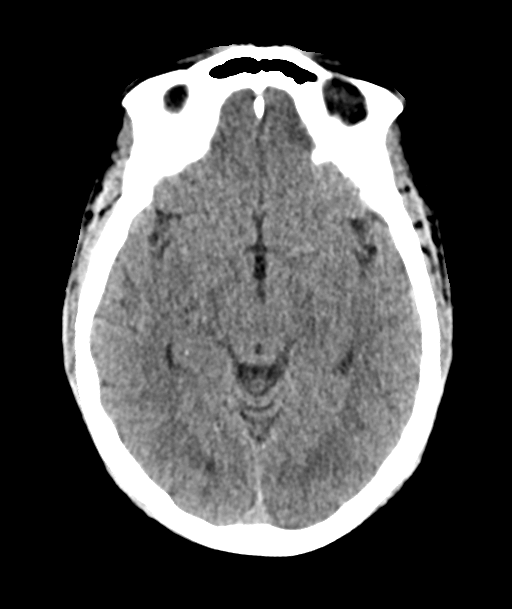
[im 17/33  brain]
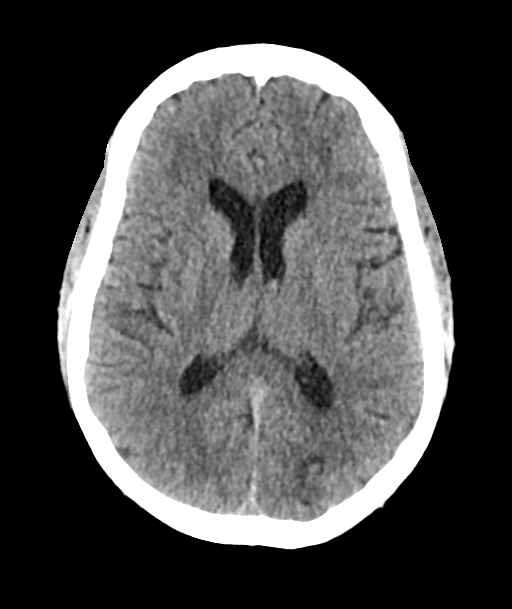
[im 21/33  brain]
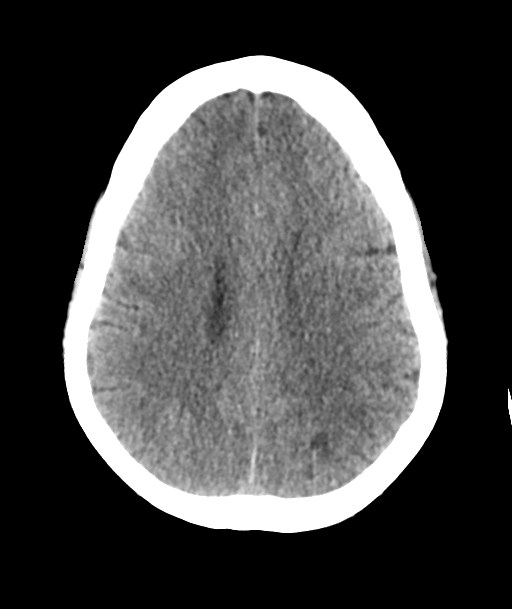
[im 21/33  bone]
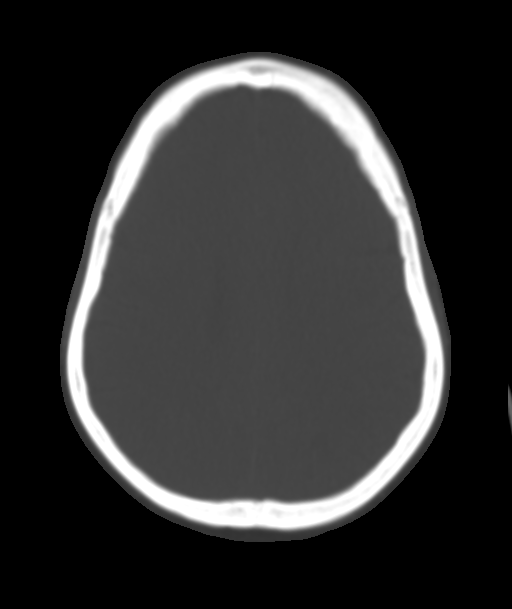
[im 25/33  brain]
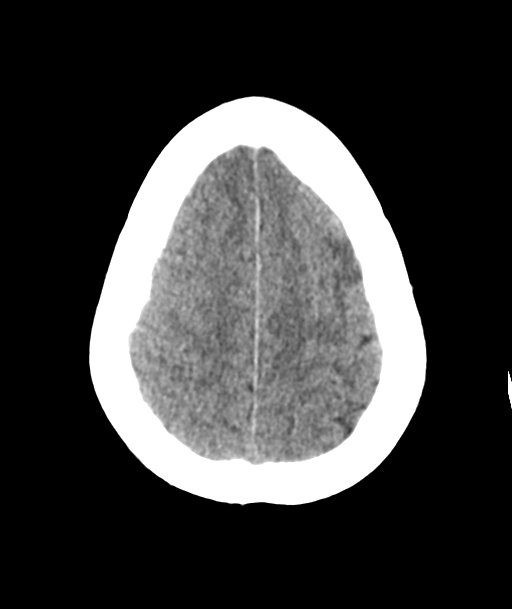
[im 29/33  brain]
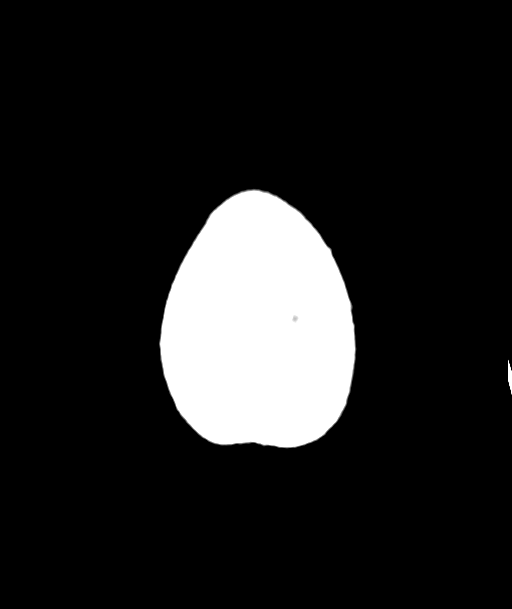

[Series 4: head bone · axial · 0.45mm/px · z∈[-48,-32]mm · 2 of 83 slices shown]
[im 9/83  bone]
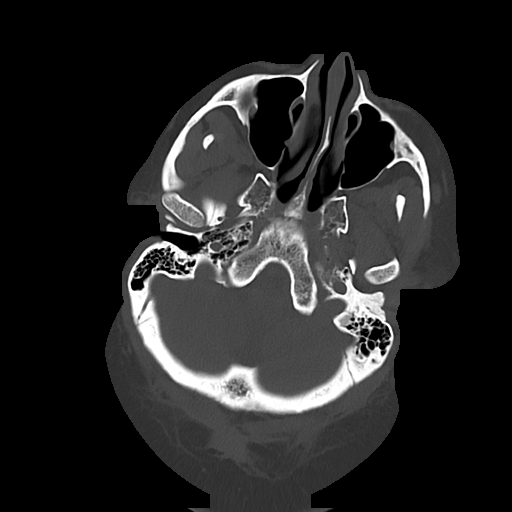
[im 17/83  bone]
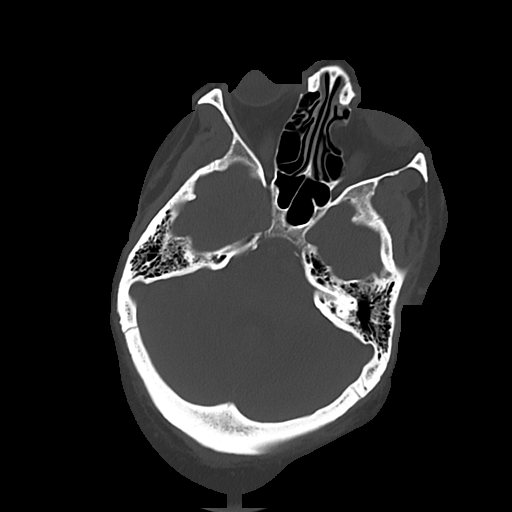

[Series 5: cor soft · coronal · 0.32mm/px · 3 of 73 slices shown]
[im 25/73  brain]
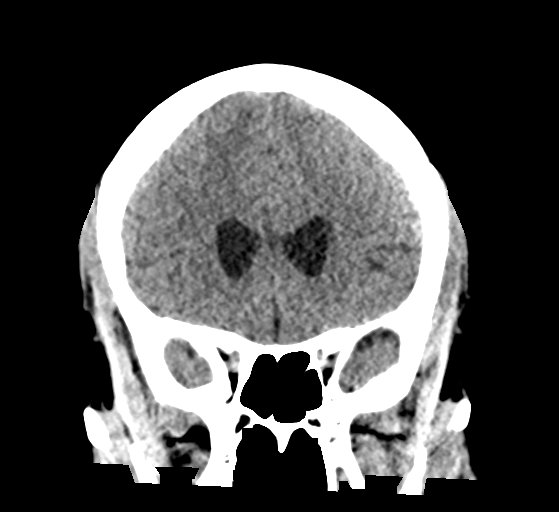
[im 33/73  brain]
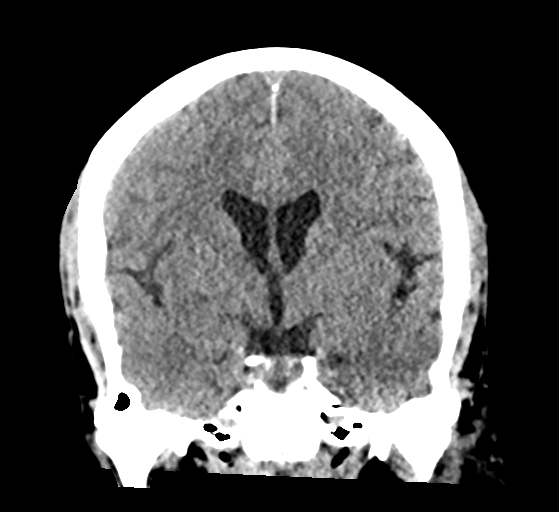
[im 41/73  brain]
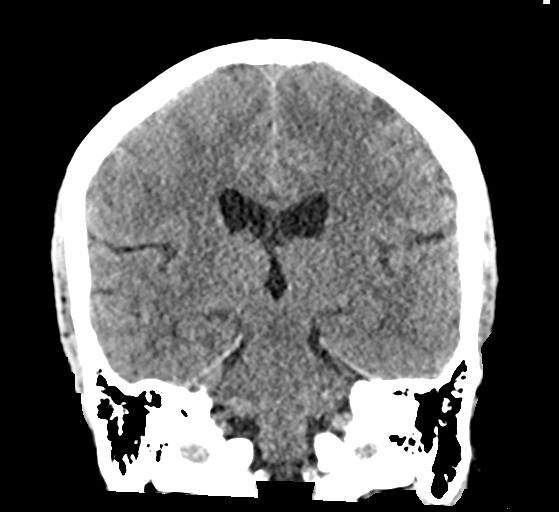

[Series 6: sag soft · sagittal · 0.32mm/px · 3 of 62 slices shown]
[im 21/62  brain]
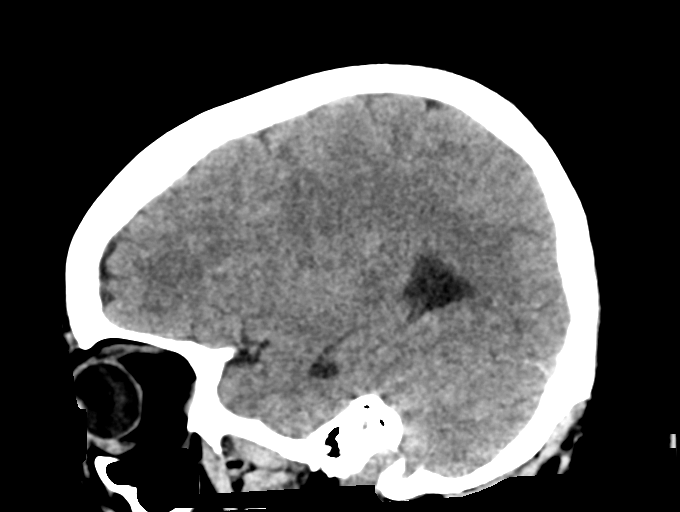
[im 31/62  brain]
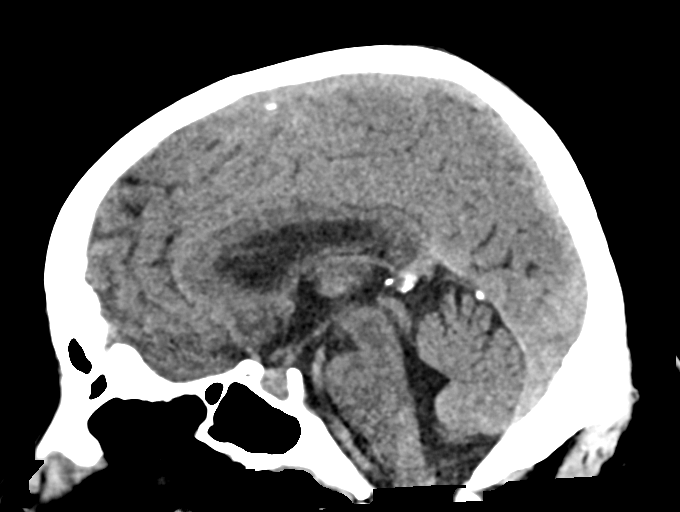
[im 41/62  brain]
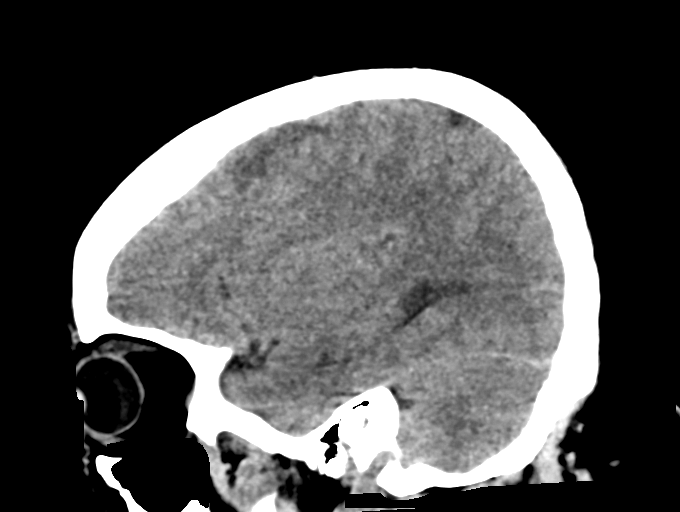

[15 of 47 positions shown; findings below may reference images not displayed]

FINDINGS: Brain: Small area of patchy hypodensity in the left frontal lobe
with loss of the normal gray-white matter differentiation (series 3,
images 8-11). Additional small patchy white matter hypodensity in
the left occipital lobe. No evidence of hemorrhage, hydrocephalus,
extra-axial collection or mass lesion/mass effect.

Vascular: Calcified atherosclerosis at the skullbase. No hyperdense
vessel.

Skull: Normal. Negative for fracture or focal lesion.

Sinuses/Orbits: No acute finding. Old left lamina papyracea fracture
with intraorbital fat herniation medially.

Other: None.
IMPRESSION: 1. Small area of patchy hypodensity in the left frontal lobe with
loss of the normal gray-white matter differentiation, concerning for
acute to subacute infarct given clinical history.
2. Age indeterminate small white matter infarct in the left
occipital lobe.

## 2020-03-21 IMAGING — MR MR HEAD W/O CM
12 of 17 series · 35 of 48 positions shown · IV contrast (gadavist)
Comparison: Head CT same day

CLINICAL DATA: Right worse than left weakness. Left frontal stroke
question by CT.

EXAM:
MRI HEAD WITHOUT AND WITH CONTRAST
MRA HEAD WITHOUT CONTRAST
MRA NECK WITHOUT AND WITH CONTRAST
TECHNIQUE: Multiplanar, multiecho pulse sequences of the brain and surrounding
structures were obtained without and with intravenous contrast.
Angiographic images of the Circle of Willis were obtained using MRA
technique without intravenous contrast. Angiographic images of the
neck were obtained using MRA technique without and with intravenous
contrast. Carotid stenosis measurements (when applicable) are
obtained utilizing NASCET criteria, using the distal internal
carotid diameter as the denominator.
CONTRAST:  10mL GADAVIST GADOBUTROL 1 MMOL/ML IV SOLN

[Series 5: DWI · axial · 3.0mm · 0.92mm/px · z∈[-64,+87]mm · 6 of 104 slices shown (1 of 4)]
[im 1/104]
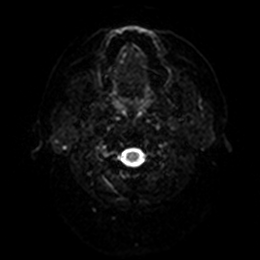
[im 21/104]
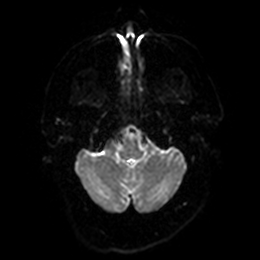
[im 42/104]
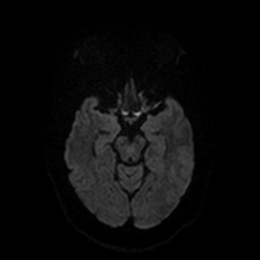
[im 62/104]
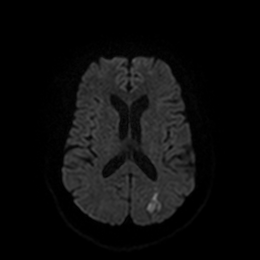
[im 83/104]
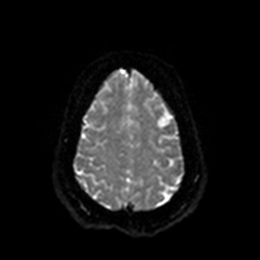
[im 104/104]
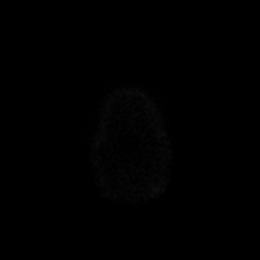

[Series 6: DWI · axial · 3.0mm · 0.92mm/px · z∈[-64,+87]mm · 3 of 52 slices shown (2 of 4)]
[im 1/52]
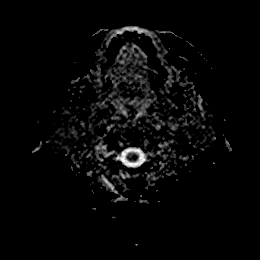
[im 26/52]
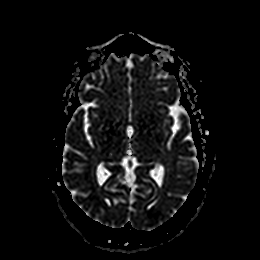
[im 52/52]
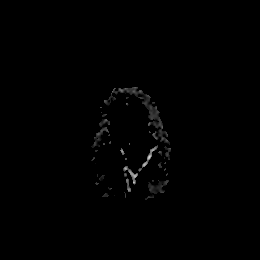

[Series 7: DWI · coronal · 4.0mm · 0.88mm/px · 4 of 72 slices shown (3 of 4)]
[im 1/72]
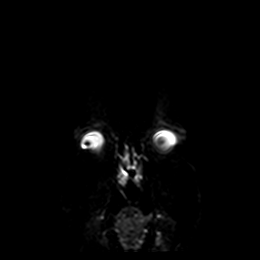
[im 24/72]
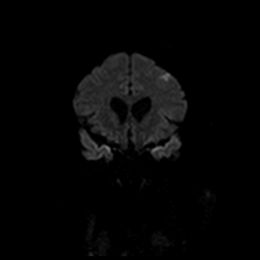
[im 48/72]
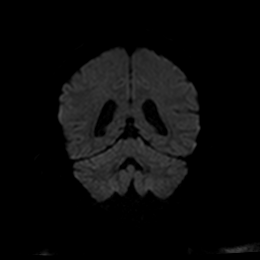
[im 72/72]
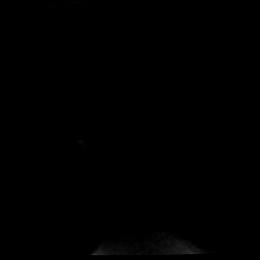

[Series 8: DWI · coronal · 4.0mm · 0.88mm/px · 2 of 36 slices shown (4 of 4)]
[im 1/36]
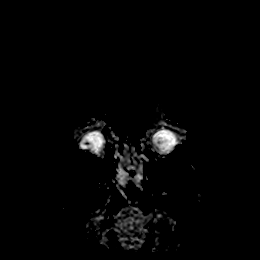
[im 36/36]
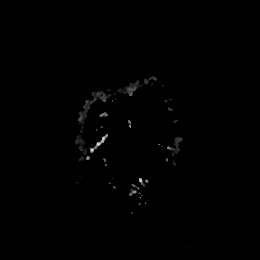

[Series 9: T1 · sagittal · 5.0mm · 0.75mm/px · 2 of 25 slices shown]
[im 1/25]
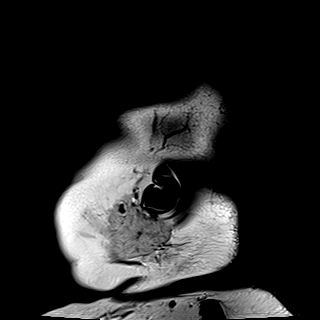
[im 25/25]
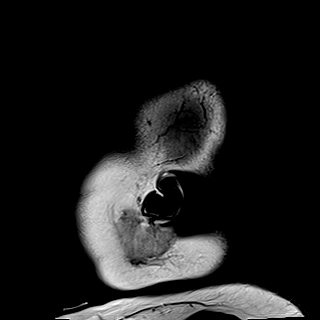

[Series 10: T2 · axial · 5.0mm · 0.75mm/px · z∈[-54,+88]mm · 2 of 25 slices shown (1 of 2)]
[im 1/25]
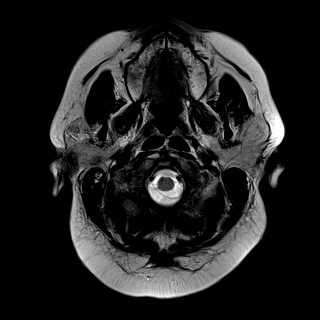
[im 25/25]
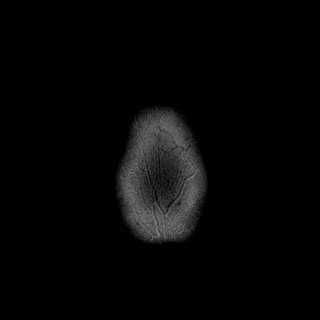

[Series 11: FLAIR · axial · 5.0mm · 0.47mm/px · z∈[-55,+88]mm · 2 of 25 slices shown]
[im 1/25]
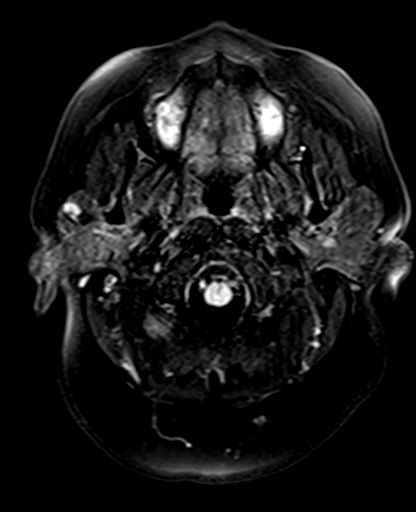
[im 25/25]
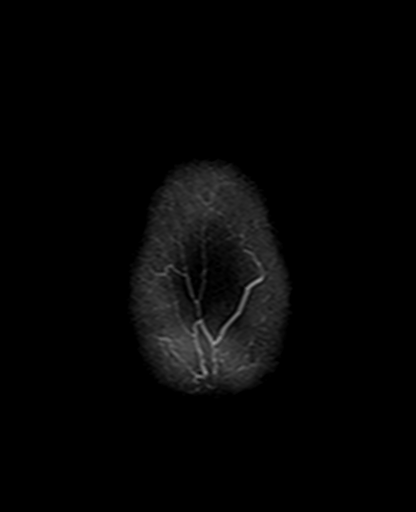

[Series 12: mag_images · axial · 3.0mm · 0.90mm/px · z∈[-64,+99]mm · 3 of 56 slices shown]
[im 1/56]
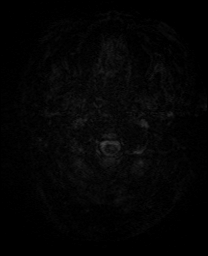
[im 28/56]
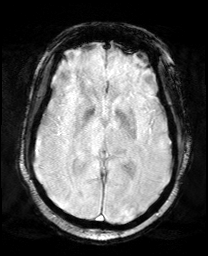
[im 56/56]
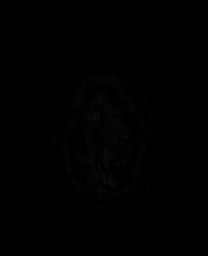

[Series 13: pha_images · axial · 3.0mm · 0.90mm/px · z∈[-64,+99]mm · 3 of 55 slices shown]
[im 1/55]
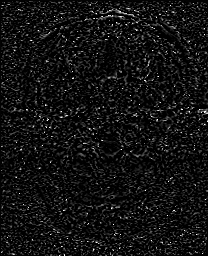
[im 28/55]
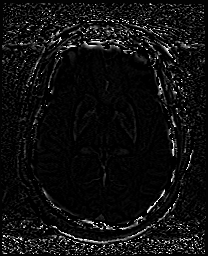
[im 55/55]
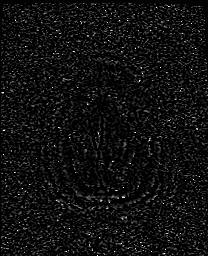

[Series 14: swi_images · axial · 3.0mm · 0.90mm/px · z∈[-64,+99]mm · 3 of 56 slices shown]
[im 1/56]
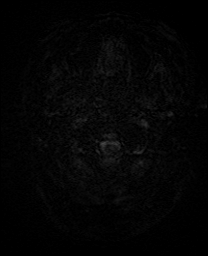
[im 28/56]
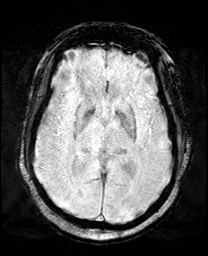
[im 56/56]
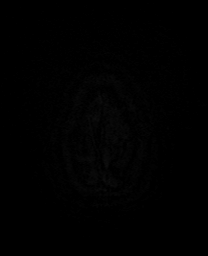

[Series 15: mip_images(sw) · axial · 24.0mm · 0.90mm/px · z∈[-54,+88]mm · 3 of 49 slices shown]
[im 1/49]
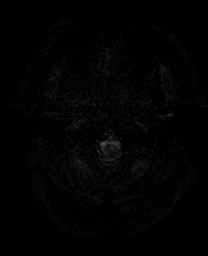
[im 25/49]
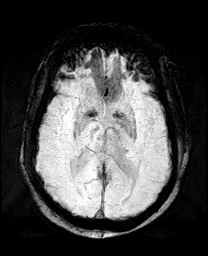
[im 49/49]
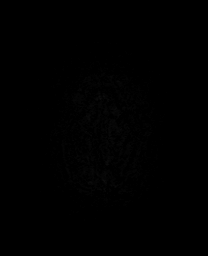

[Series 17: T2 · coronal · 5.0mm · 0.34mm/px · 2 of 30 slices shown (2 of 2)]
[im 1/30]
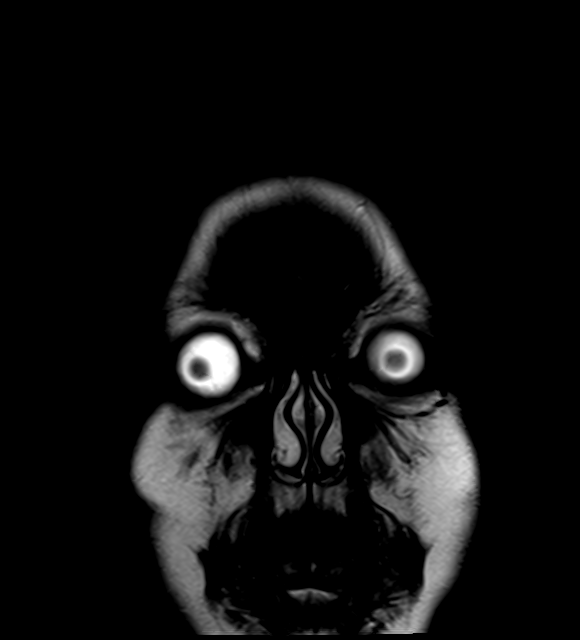
[im 30/30]
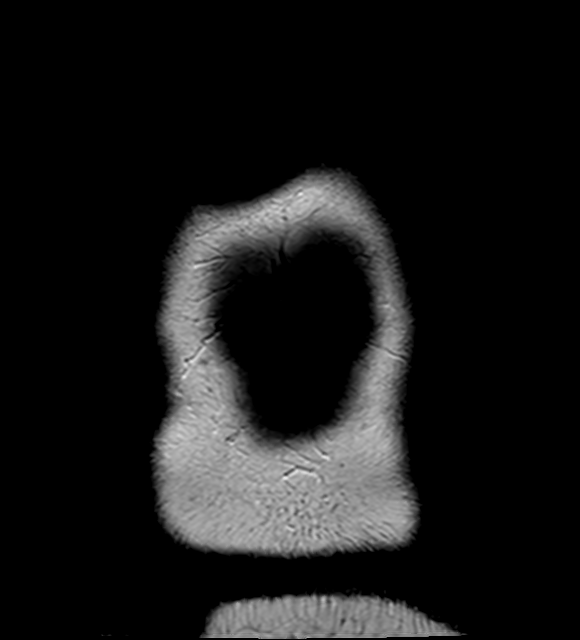

[35 of 48 positions shown; findings below may reference images not displayed]

FINDINGS: MRI HEAD FINDINGS

Brain: Diffusion imaging shows numerous acute infarctions affecting
the cortical and subcortical brain extending from front to back on
the left consistent with watershed distribution infarctions. No
evidence of mass effect or detectable hemorrhage. No acute
infarction in the right hemisphere, brainstem or cerebellum. Mild
chronic small-vessel change affects the hemispheric white matter. No
hydrocephalus or extra-axial collection.

Vascular: Major vessels at the base of the brain show flow.

Skull and upper cervical spine: Negative

Sinuses/Orbits: Clear/normal

Other: None

MRA HEAD FINDINGS

Both internal carotid arteries are patent through the skull base and
siphon regions. On the right, the anterior and middle cerebral
vessels are patent. Question mild stenosis of the proximal M2
branches on the right. On the left, the anterior cerebral artery is
widely patent. The left M1 segment is widely patent. There is severe
narrowing of the proximal M2 branches on the left. This could be due
to plaque or nonocclusive embolus.

Both vertebral arteries widely patent to the basilar. No basilar
stenosis. Stenotic change of the right superior cerebellar artery.
Left superior cerebellar artery is normal. Both posterior cerebral
arteries are patent. Some atherosclerotic irregularity of more
distal branch vessels, right more than left.

MRA NECK FINDINGS

Branching pattern from the arch is normal. Common origin of the
innominate artery and left common carotid artery. Both common
carotid arteries are widely patent to the bifurcation. Minimal
atherosclerotic change at both carotid bifurcations but no stenosis.
Both cervical internal carotid arteries are normal.

Both vertebral arteries are widely patent through the cervical
region to the basilar.
IMPRESSION: 1. Numerous acute infarctions affecting the cortical and subcortical
brain extending from front to back on the left at the junction of
the ACA and MCA territories consistent with watershed distribution
infarctions. No evidence of mass effect or hemorrhage.
2. Severe narrowing of the proximal M2 branch vessels on the left,
which could be due to plaque or nonocclusive embolus.
3. Intracranial MR angiography of the neck vessels shows only
minimal atherosclerotic change at the carotid bifurcations without
flow limiting stenosis or pronounced irregularity.
4. Mild atherosclerotic narrowing of the proximal M2 branches on the
right. Atherosclerotic narrowing of the right superior cerebellar
artery. Mild atherosclerotic irregularity of the more distal PCA
branches right more than left.

## 2020-03-21 MED ORDER — ASPIRIN EC 81 MG PO TBEC
81.0000 mg | DELAYED_RELEASE_TABLET | Freq: Every day | ORAL | Status: DC
  Administered 2020-03-21 – 2020-03-23 (×3): 81 mg via ORAL
  Filled 2020-03-21 (×3): qty 1

## 2020-03-21 MED ORDER — CLOPIDOGREL BISULFATE 75 MG PO TABS
75.0000 mg | ORAL_TABLET | Freq: Every day | ORAL | Status: DC
  Administered 2020-03-22 – 2020-03-23 (×2): 75 mg via ORAL
  Filled 2020-03-21 (×2): qty 1

## 2020-03-21 MED ORDER — ENOXAPARIN SODIUM 40 MG/0.4ML ~~LOC~~ SOLN
40.0000 mg | SUBCUTANEOUS | Status: DC
  Administered 2020-03-21 – 2020-03-22 (×2): 40 mg via SUBCUTANEOUS
  Filled 2020-03-21 (×2): qty 0.4

## 2020-03-21 MED ORDER — SODIUM CHLORIDE 0.9 % IV SOLN: INTRAVENOUS | Status: DC

## 2020-03-21 MED ORDER — GABAPENTIN 100 MG PO CAPS
100.0000 mg | ORAL_CAPSULE | Freq: Three times a day (TID) | ORAL | Status: DC
  Administered 2020-03-21 – 2020-03-23 (×6): 100 mg via ORAL
  Filled 2020-03-21 (×6): qty 1

## 2020-03-21 MED ORDER — CLOPIDOGREL BISULFATE 300 MG PO TABS
300.0000 mg | ORAL_TABLET | Freq: Once | ORAL | Status: AC
  Administered 2020-03-21: 300 mg via ORAL
  Filled 2020-03-21: qty 1

## 2020-03-21 MED ORDER — ACETAMINOPHEN 325 MG PO TABS
650.0000 mg | ORAL_TABLET | ORAL | Status: DC | PRN
  Administered 2020-03-22 (×3): 650 mg via ORAL
  Filled 2020-03-21 (×3): qty 2

## 2020-03-21 MED ORDER — POLYETHYLENE GLYCOL 3350 17 G PO PACK: 17.0000 g | PACK | Freq: Every day | ORAL | Status: DC | PRN

## 2020-03-21 MED ORDER — SODIUM CHLORIDE 0.9 % IV BOLUS
1000.0000 mL | Freq: Once | INTRAVENOUS | Status: AC
  Administered 2020-03-21: 1000 mL via INTRAVENOUS

## 2020-03-21 MED ORDER — BRIMONIDINE TARTRATE 0.2 % OP SOLN
1.0000 [drp] | Freq: Three times a day (TID) | OPHTHALMIC | Status: DC
  Administered 2020-03-22 – 2020-03-23 (×5): 1 [drp] via OPHTHALMIC
  Filled 2020-03-21 (×2): qty 5

## 2020-03-21 MED ORDER — ACETAMINOPHEN 650 MG RE SUPP: 650.0000 mg | RECTAL | Status: DC | PRN

## 2020-03-21 MED ORDER — HYDRALAZINE HCL 25 MG PO TABS: 25.0000 mg | ORAL_TABLET | Freq: Four times a day (QID) | ORAL | Status: DC | PRN

## 2020-03-21 MED ORDER — GADOBUTROL 1 MMOL/ML IV SOLN
10.0000 mL | Freq: Once | INTRAVENOUS | Status: AC | PRN
  Administered 2020-03-21: 10 mL via INTRAVENOUS

## 2020-03-21 MED ORDER — FERROUS SULFATE 325 (65 FE) MG PO TABS
325.0000 mg | ORAL_TABLET | Freq: Every day | ORAL | Status: DC
Start: 2020-03-22 — End: 2020-03-23
  Administered 2020-03-22 – 2020-03-23 (×2): 325 mg via ORAL
  Filled 2020-03-21 (×2): qty 1

## 2020-03-21 MED ORDER — SENNOSIDES-DOCUSATE SODIUM 8.6-50 MG PO TABS: 1.0000 | ORAL_TABLET | Freq: Every evening | ORAL | Status: DC | PRN

## 2020-03-21 MED ORDER — ASPIRIN 81 MG PO CHEW: 324.0000 mg | CHEWABLE_TABLET | Freq: Once | ORAL | Status: DC

## 2020-03-21 MED ORDER — ACETAMINOPHEN 160 MG/5ML PO SOLN: 650.0000 mg | ORAL | Status: DC | PRN

## 2020-03-21 MED ORDER — INSULIN ASPART 100 UNIT/ML ~~LOC~~ SOLN
0.0000 [IU] | Freq: Three times a day (TID) | SUBCUTANEOUS | Status: DC
  Administered 2020-03-22: 2 [IU] via SUBCUTANEOUS
  Administered 2020-03-23: 3 [IU] via SUBCUTANEOUS
  Administered 2020-03-23: 2 [IU] via SUBCUTANEOUS

## 2020-03-21 MED ORDER — DORZOLAMIDE HCL-TIMOLOL MAL 2-0.5 % OP SOLN
1.0000 [drp] | Freq: Two times a day (BID) | OPHTHALMIC | Status: DC
  Administered 2020-03-22 – 2020-03-23 (×3): 1 [drp] via OPHTHALMIC
  Filled 2020-03-21: qty 10

## 2020-03-21 MED ORDER — SODIUM BICARBONATE 650 MG PO TABS
650.0000 mg | ORAL_TABLET | Freq: Two times a day (BID) | ORAL | Status: DC
  Administered 2020-03-22 – 2020-03-23 (×3): 650 mg via ORAL
  Filled 2020-03-21 (×5): qty 1

## 2020-03-21 MED ORDER — STROKE: EARLY STAGES OF RECOVERY BOOK
Freq: Once | Status: AC
  Filled 2020-03-21: qty 1

## 2020-03-21 MED ORDER — AZELASTINE HCL 0.1 % NA SOLN
1.0000 | Freq: Every day | NASAL | Status: DC | PRN
  Filled 2020-03-21: qty 30

## 2020-03-21 MED ORDER — LATANOPROST 0.005 % OP SOLN
1.0000 [drp] | Freq: Every day | OPHTHALMIC | Status: DC
  Administered 2020-03-22: 1 [drp] via OPHTHALMIC
  Filled 2020-03-21: qty 2.5

## 2020-03-21 MED ORDER — MONTELUKAST SODIUM 10 MG PO TABS
10.0000 mg | ORAL_TABLET | Freq: Every day | ORAL | Status: DC
Start: 2020-03-21 — End: 2020-03-21

## 2020-03-21 NOTE — ED Triage Notes (Signed)
Pt here with reports of intermittent shortness of breath, worsening overnight. Pt also c/o generalized weakness (worse on the R side) for the last 3 days. Pt with no facial droop, no arm/leg drift. Pt states she recently started on acetazolamide and thinks this may be causing her symptoms.

## 2020-03-21 NOTE — H&P (Signed)
History and Physical    Sharon Blankenship GYJ:[id number] DOB: 03/05/1966 DOA: 03/21/2020  PCP: Fanny Bien, MD (Confirm with patient/family/NH records and if not entered, this has to be entered at [facility] point of entry) Patient coming from: Home  I have personally briefly reviewed patient's old medical records in [place]  Chief Complaint: Right-sided weakness  HPI: Sharon Blankenship is a 54 y.o. female with medical history significant of HTN, IDDM, chronic closed angle glaucoma, morbid obesity, presented with new onset right-sided weakness. Her symptoms started 2 days ago, initially was intermittent then became constant. Symptoms involve right-sided weakness mainly on the right arm, associated with numbness. Patient was recently started on acetazolamide for her worsening glaucoma, and she has been having intermittent weakness dizziness and confusion, which she attributed to the side effect of medication. Last week she noticed her blood pressure was around 120s, which is significantly lower than her baseline systolic 263Z. She also noticed her glucose level has been on the lower side lately, in the morning her fingerstick reading lower 100s, and her most recent A1c was 6 compared to around 8 before. ED Course: MRI positive for left ACA and MCA attribution watershed stroke. Labs showed no anion gap severe metabolic acidosis.  Review of Systems: As per HPI otherwise 14 point review of systems negative.   Past Medical History:  Diagnosis Date   Diabetes mellitus without complication ([place])    Hypertension     Past Surgical History:  Procedure Laterality Date   ABDOMINAL HYSTERECTOMY     2016     reports that she has never smoked. She has never used smokeless tobacco. She reports that she does not drink alcohol and does not use drugs.  Allergies  Allergen Reactions   Aloe Shortness Of Breath and Rash    SOB, Swelling, Rash, Itching   Penicillins Hives, Itching and Swelling     Did it involve swelling of the face/tongue/throat, SOB, or low BP? Y Did it involve sudden or severe rash/hives, skin peeling, or any reaction on the inside of your mouth or nose? N Did you need to seek medical attention at a hospital or doctor's office? Y When did it last happen? Several Years Ago If all above answers are NO, may proceed with cephalosporin use.     No family history on file.   Prior to Admission medications   Medication Sig Start Date End Date Taking? Authorizing Provider  atorvastatin (LIPITOR) 20 MG tablet Take 20 mg by mouth daily.   Yes [provider]  azelastine (ASTELIN) 0.1 % nasal spray Place 1 spray into both nostrils daily as needed for allergies. 03/12/20  Yes [provider]  brimonidine (ALPHAGAN) 0.2 % ophthalmic solution  and 1 drop into both eyes 3 (three) times daily. 02/18/20  Yes [provider]  dorzolamide-timolol (COSOPT) 22.3-6.8 MG/ML ophthalmic solution Place 1 drop into the right eye 2 (two) times daily. 02/10/20  Yes [provider]  ferrous sulfate 325 (65 FE) MG tablet Take 325 mg by mouth daily with breakfast.   Yes [provider]  FIASP FLEXTOUCH 100 UNIT/ML FlexTouch Pen Inject 18 Units into the skin daily as needed (blood sugar). 03/21/20  Yes [provider]  gabapentin (NEURONTIN) 100 MG capsule Take 100 mg by mouth 3 (three) times daily as needed (nerve pain). 03/04/20  Yes [provider]  hydrochlorothiazide (HYDRODIURIL) 25 MG tablet Take 25 mg by mouth daily. 01/31/20  Yes [provider]  latanoprost Ivin Poot)  0.005 % ophthalmic solution Place 1 drop into both eyes at bedtime. 02/12/20  Yes [provider]  losartan (COZAAR) 100 MG tablet Take 100 mg by mouth daily.   Yes [provider]  meloxicam (MOBIC) 15 MG tablet Take 15 mg by mouth daily as needed for pain. 01/01/20  Yes [provider]  metFORMIN (GLUCOPHAGE) 1000 MG tablet  Take 1,000 mg by mouth 2 (two) times daily with a meal.   Yes [provider]  OZEMPIC, 1 MG/DOSE, 4 MG/3ML SOPN Inject 1 mg into the skin once a week. On Wednesday 03/21/20  Yes [provider]  polyethylene glycol (MIRALAX / GLYCOLAX) packet Take 17 g by mouth daily as needed for mild constipation or moderate constipation.   Yes [provider]  TRESIBA FLEXTOUCH 200 UNIT/ML FlexTouch Pen Inject 80 Units into the skin daily. 02/22/20  Yes [provider]  methocarbamol (ROBAXIN) 500 MG tablet Take 500 mg by mouth 3 (three) times daily. 03/04/20   [provider]    Physical Exam: Vitals:   03/21/20 1130 03/21/20 1200 03/21/20 1430 03/21/20 1545  BP: (!) 152/87  135/66 125/71  Pulse: 97 100 92 90  Resp: 13 16 15 16   Temp:    98.3 F (36.8 C)  TempSrc:    Oral  SpO2: 100% 100% 100% 100%    Constitutional: NAD, calm, comfortable Vitals:   03/21/20 1130 03/21/20 1200 03/21/20 1430 03/21/20 1545  BP: (!) 152/87  135/66 125/71  Pulse: 97 100 92 90  Resp: 13 16 15 16   Temp:    98.3 F (36.8 C)  TempSrc:    Oral  SpO2: 100% 100% 100% 100%   Eyes: PERRL, lids and conjunctivae normal ENMT: Mucous membranes are moist. Posterior pharynx clear of any exudate or lesions.Normal dentition.  Neck: normal, supple, no masses, no thyromegaly Respiratory: clear to auscultation bilaterally, no wheezing, no crackles. Normal respiratory effort. No accessory muscle use.  Cardiovascular: Regular rate and rhythm, no murmurs / rubs / gallops. No extremity edema. 2+ pedal pulses. No carotid bruits.  Abdomen: no tenderness, no masses palpated. No hepatosplenomegaly. Bowel sounds positive.  Musculoskeletal: no clubbing / cyanosis. No joint deformity upper and lower extremities. Good ROM, no contractures. Normal muscle tone.  Skin: no rashes, lesions, ulcers. No induration Neurologic: CN 2-12 grossly intact. Sensation intact, DTR normal. Strength 3/5 in right  upper extremity compared to the left, bilateral lower extremity strength 4/5. Psychiatric: Normal judgment and insight. Alert and oriented x 3. Normal mood.   (Anything < 9 systems with 2 bullets each down codes to level 1) (If patient refuses exam cant bill higher level) (Make sure to document decubitus ulcers present on admission -- if possible -- and whether patient has chronic indwelling catheter at time of admission)  Labs on Admission: I have personally reviewed following labs and imaging studies  CBC: Recent Labs  Lab 03/21/20 0725  WBC 7.6  HGB 11.0*  HCT 32.7*  MCV 80.9  PLT 540   Basic Metabolic Panel: Recent Labs  Lab 03/21/20 0725  NA 141  K 3.4*  CL 114*  CO2 13*  GLUCOSE 98  BUN 22*  CREATININE 1.47*  CALCIUM 9.6   GFR: CrCl cannot be calculated (Unknown ideal weight.). Liver Function Tests: Recent Labs  Lab 03/21/20 1157  AST 29  ALT 61*  ALKPHOS 136*  BILITOT 0.9  PROT 7.6  ALBUMIN 3.7   No results for input(s): LIPASE, AMYLASE in the last 168  hours. No results for input(s): AMMONIA in the last 168 hours. Coagulation Profile: No results for input(s): INR, PROTIME in the last 168 hours. Cardiac Enzymes: No results for input(s): CKTOTAL, CKMB, CKMBINDEX, TROPONINI in the last 168 hours. BNP (last 3 results) No results for input(s): PROBNP in the last 8760 hours. HbA1C: No results for input(s): HGBA1C in the last 72 hours. CBG: No results for input(s): GLUCAP in the last 168 hours. Lipid Profile: No results for input(s): CHOL, HDL, LDLCALC, TRIG, CHOLHDL, LDLDIRECT in the last 72 hours. Thyroid Function Tests: No results for input(s): TSH, T4TOTAL, FREET4, T3FREE, THYROIDAB in the last 72 hours. Anemia Panel: No results for input(s): VITAMINB12, FOLATE, FERRITIN, TIBC, IRON, RETICCTPCT in the last 72 hours. Urine analysis: No results found for: COLORURINE, APPEARANCEUR, LABSPEC, [place], GLUCOSEU, HGBUR, BILIRUBINUR, KETONESUR, PROTEINUR,  UROBILINOGEN, NITRITE, LEUKOCYTESUR  Radiological Exams on Admission: DG Chest 2 View  Result Date: 03/21/2020 CLINICAL DATA:  shortness of breath EXAM: CHEST - 2 VIEW COMPARISON:  None. FINDINGS: Mild hypoinflation. Patchy retrocardiac opacities. No pneumothorax or pleural effusion. Cardiomediastinal silhouette is within normal limits. Multilevel spondylosis. IMPRESSION: Patchy retrocardiac opacities, atelectasis versus infiltrate. Mild hypoinflation. Electronically Signed   By: Primitivo Gauze M.D.   On: 03/21/2020 08:07   CT Head Wo Contrast  Result Date: 03/21/2020 CLINICAL DATA:  Right greater than left-sided weakness for the past 3 days. EXAM: CT HEAD WITHOUT CONTRAST TECHNIQUE: Contiguous axial images were obtained from the base of the skull through the vertex without intravenous contrast. COMPARISON:  None. FINDINGS: Brain: Small area of patchy hypodensity in the left frontal lobe with loss of the normal gray-white matter differentiation (series 3, images 8-11). Additional small patchy white matter hypodensity in the left occipital lobe. No evidence of hemorrhage, hydrocephalus, extra-axial collection or mass lesion/mass effect. Vascular: Calcified atherosclerosis at the skullbase. No hyperdense vessel. Skull: Normal. Negative for fracture or focal lesion. Sinuses/Orbits: No acute finding. Old left lamina papyracea fracture with intraorbital fat herniation medially. Other: None. IMPRESSION: 1. Small area of patchy hypodensity in the left frontal lobe with loss of the normal gray-white matter differentiation, concerning for acute to subacute infarct given clinical history. 2. Age indeterminate small white matter infarct in the left occipital lobe. Electronically Signed   By: Titus Dubin M.D.   On: 03/21/2020 13:02   MR ANGIO HEAD WO CONTRAST  Result Date: 03/21/2020 CLINICAL DATA:  Right worse than left weakness. Left frontal stroke question by CT. EXAM: MRI HEAD WITHOUT AND WITH  CONTRAST MRA HEAD WITHOUT CONTRAST MRA NECK WITHOUT AND WITH CONTRAST TECHNIQUE: Multiplanar, multiecho pulse sequences of the brain and surrounding structures were obtained without and with intravenous contrast. Angiographic images of the Circle of Willis were obtained using MRA technique without intravenous contrast. Angiographic images of the neck were obtained using MRA technique without and with intravenous contrast. Carotid stenosis measurements (when applicable) are obtained utilizing NASCET criteria, using the distal internal carotid diameter as the denominator. CONTRAST:  59mL GADAVIST GADOBUTROL 1 MMOL/ML IV SOLN COMPARISON:  Head CT same day FINDINGS: MRI HEAD FINDINGS Brain: Diffusion imaging shows numerous acute infarctions affecting the cortical and subcortical brain extending from front to back on the left consistent with watershed distribution infarctions. No evidence of mass effect or detectable hemorrhage. No acute infarction in the right hemisphere, brainstem or cerebellum. Mild chronic small-vessel change affects the hemispheric white matter. No hydrocephalus or extra-axial collection. Vascular: Major vessels at the base of the brain show flow. Skull and upper cervical spine:  Negative Sinuses/Orbits: Clear/normal Other: None MRA HEAD FINDINGS Both internal carotid arteries are patent through the skull base and siphon regions. On the right, the anterior and middle cerebral vessels are patent. Question mild stenosis of the proximal M2 branches on the right. On the left, the anterior cerebral artery is widely patent. The left M1 segment is widely patent. There is severe narrowing of the proximal M2 branches on the left. This could be due to plaque or nonocclusive embolus. Both vertebral arteries widely patent to the basilar. No basilar stenosis. Stenotic change of the right superior cerebellar artery. Left superior cerebellar artery is normal. Both posterior cerebral arteries are patent. Some  atherosclerotic irregularity of more distal branch vessels, right more than left. MRA NECK FINDINGS Branching pattern from the arch is normal. Common origin of the innominate artery and left common carotid artery. Both common carotid arteries are widely patent to the bifurcation. Minimal atherosclerotic change at both carotid bifurcations but no stenosis. Both cervical internal carotid arteries are normal. Both vertebral arteries are widely patent through the cervical region to the basilar. IMPRESSION: 1. Numerous acute infarctions affecting the cortical and subcortical brain extending from front to back on the left at the junction of the ACA and MCA territories consistent with watershed distribution infarctions. No evidence of mass effect or hemorrhage. 2. Severe narrowing of the proximal M2 branch vessels on the left, which could be due to plaque or nonocclusive embolus. 3. Intracranial MR angiography of the neck vessels shows only minimal atherosclerotic change at the carotid bifurcations without flow limiting stenosis or pronounced irregularity. 4. Mild atherosclerotic narrowing of the proximal M2 branches on the right. Atherosclerotic narrowing of the right superior cerebellar artery. Mild atherosclerotic irregularity of the more distal PCA branches right more than left. Electronically Signed   By: Nelson Chimes M.D.   On: 03/21/2020 17:42   MR ANGIO NECK W WO CONTRAST  Result Date: 03/21/2020 CLINICAL DATA:  Right worse than left weakness. Left frontal stroke question by CT. EXAM: MRI HEAD WITHOUT AND WITH CONTRAST MRA HEAD WITHOUT CONTRAST MRA NECK WITHOUT AND WITH CONTRAST TECHNIQUE: Multiplanar, multiecho pulse sequences of the brain and surrounding structures were obtained without and with intravenous contrast. Angiographic images of the Circle of Willis were obtained using MRA technique without intravenous contrast. Angiographic images of the neck were obtained using MRA technique without and with  intravenous contrast. Carotid stenosis measurements (when applicable) are obtained utilizing NASCET criteria, using the distal internal carotid diameter as the denominator. CONTRAST:  43mL GADAVIST GADOBUTROL 1 MMOL/ML IV SOLN COMPARISON:  Head CT same day FINDINGS: MRI HEAD FINDINGS Brain: Diffusion imaging shows numerous acute infarctions affecting the cortical and subcortical brain extending from front to back on the left consistent with watershed distribution infarctions. No evidence of mass effect or detectable hemorrhage. No acute infarction in the right hemisphere, brainstem or cerebellum. Mild chronic small-vessel change affects the hemispheric white matter. No hydrocephalus or extra-axial collection. Vascular: Major vessels at the base of the brain show flow. Skull and upper cervical spine: Negative Sinuses/Orbits: Clear/normal Other: None MRA HEAD FINDINGS Both internal carotid arteries are patent through the skull base and siphon regions. On the right, the anterior and middle cerebral vessels are patent. Question mild stenosis of the proximal M2 branches on the right. On the left, the anterior cerebral artery is widely patent. The left M1 segment is widely patent. There is severe narrowing of the proximal M2 branches on the left. This could be due to plaque  or nonocclusive embolus. Both vertebral arteries widely patent to the basilar. No basilar stenosis. Stenotic change of the right superior cerebellar artery. Left superior cerebellar artery is normal. Both posterior cerebral arteries are patent. Some atherosclerotic irregularity of more distal branch vessels, right more than left. MRA NECK FINDINGS Branching pattern from the arch is normal. Common origin of the innominate artery and left common carotid artery. Both common carotid arteries are widely patent to the bifurcation. Minimal atherosclerotic change at both carotid bifurcations but no stenosis. Both cervical internal carotid arteries are normal.  Both vertebral arteries are widely patent through the cervical region to the basilar. IMPRESSION: 1. Numerous acute infarctions affecting the cortical and subcortical brain extending from front to back on the left at the junction of the ACA and MCA territories consistent with watershed distribution infarctions. No evidence of mass effect or hemorrhage. 2. Severe narrowing of the proximal M2 branch vessels on the left, which could be due to plaque or nonocclusive embolus. 3. Intracranial MR angiography of the neck vessels shows only minimal atherosclerotic change at the carotid bifurcations without flow limiting stenosis or pronounced irregularity. 4. Mild atherosclerotic narrowing of the proximal M2 branches on the right. Atherosclerotic narrowing of the right superior cerebellar artery. Mild atherosclerotic irregularity of the more distal PCA branches right more than left. Electronically Signed   By: Nelson Chimes M.D.   On: 03/21/2020 17:42   MR BRAIN WO CONTRAST  Result Date: 03/21/2020 CLINICAL DATA:  Right worse than left weakness. Left frontal stroke question by CT. EXAM: MRI HEAD WITHOUT AND WITH CONTRAST MRA HEAD WITHOUT CONTRAST MRA NECK WITHOUT AND WITH CONTRAST TECHNIQUE: Multiplanar, multiecho pulse sequences of the brain and surrounding structures were obtained without and with intravenous contrast. Angiographic images of the Circle of Willis were obtained using MRA technique without intravenous contrast. Angiographic images of the neck were obtained using MRA technique without and with intravenous contrast. Carotid stenosis measurements (when applicable) are obtained utilizing NASCET criteria, using the distal internal carotid diameter as the denominator. CONTRAST:  12mL GADAVIST GADOBUTROL 1 MMOL/ML IV SOLN COMPARISON:  Head CT same day FINDINGS: MRI HEAD FINDINGS Brain: Diffusion imaging shows numerous acute infarctions affecting the cortical and subcortical brain extending from front to back on  the left consistent with watershed distribution infarctions. No evidence of mass effect or detectable hemorrhage. No acute infarction in the right hemisphere, brainstem or cerebellum. Mild chronic small-vessel change affects the hemispheric white matter. No hydrocephalus or extra-axial collection. Vascular: Major vessels at the base of the brain show flow. Skull and upper cervical spine: Negative Sinuses/Orbits: Clear/normal Other: None MRA HEAD FINDINGS Both internal carotid arteries are patent through the skull base and siphon regions. On the right, the anterior and middle cerebral vessels are patent. Question mild stenosis of the proximal M2 branches on the right. On the left, the anterior cerebral artery is widely patent. The left M1 segment is widely patent. There is severe narrowing of the proximal M2 branches on the left. This could be due to plaque or nonocclusive embolus. Both vertebral arteries widely patent to the basilar. No basilar stenosis. Stenotic change of the right superior cerebellar artery. Left superior cerebellar artery is normal. Both posterior cerebral arteries are patent. Some atherosclerotic irregularity of more distal branch vessels, right more than left. MRA NECK FINDINGS Branching pattern from the arch is normal. Common origin of the innominate artery and left common carotid artery. Both common carotid arteries are widely patent to the bifurcation. Minimal atherosclerotic change  at both carotid bifurcations but no stenosis. Both cervical internal carotid arteries are normal. Both vertebral arteries are widely patent through the cervical region to the basilar. IMPRESSION: 1. Numerous acute infarctions affecting the cortical and subcortical brain extending from front to back on the left at the junction of the ACA and MCA territories consistent with watershed distribution infarctions. No evidence of mass effect or hemorrhage. 2. Severe narrowing of the proximal M2 branch vessels on the  left, which could be due to plaque or nonocclusive embolus. 3. Intracranial MR angiography of the neck vessels shows only minimal atherosclerotic change at the carotid bifurcations without flow limiting stenosis or pronounced irregularity. 4. Mild atherosclerotic narrowing of the proximal M2 branches on the right. Atherosclerotic narrowing of the right superior cerebellar artery. Mild atherosclerotic irregularity of the more distal PCA branches right more than left. Electronically Signed   By: Nelson Chimes M.D.   On: 03/21/2020 17:42    EKG: Independently reviewed. Sinus, no acute ST-T changes  Assessment/Plan Active Problems:   CVA (cerebral vascular accident) ([place])  (please populate well all problems here in Problem List. (For example, if patient is on BP meds at home and you resume or decide to hold them, it is a problem that needs to be her. 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 -Echo ordered -MRI showed branching of M2 narrowing, will check lipid panel for stringent cholesterol control. Also noticed noticed recent significant blood pressure fluctuation and tendency to get dehydration with high dose of Diamox, or potential worsened patient's perfusion status in the watershed stroke area. Discussed with patient regarding importance of well hydration and stabilized blood pressure. We will hold Diamox for now.  AKI with non-anion gap metabolic acidosis -Likely from overdiuresis of Diamox, hold Diamox -Hydration x24 hours and repeat kidney function, start bicarb supplement.  HTN -Hold ACEI, start as needed hydralazine and allow permissive hypertension for 24 hours.  IDDM -Glucose on the low side, hold long-acting insulin given her kidney function status, start sliding scale -Hold Metformin for 72 hours.  Chronic closed-angle glaucoma -Hold Diamox for now. Plan to have surgery in January.  Discussed with patient if we have to start Diamox can recommend she check her potassium level at least one time before surgery. DVT prophylaxis: Lovenox  code Status: Full code Family Communication: Husband at bedside Disposition Plan: Expect more than 2 midnight hospital stay, likely will need rehab for significant right arm weakness. Consults called: Neurology Admission status: Telemetry admission   Lequita Halt MD [facility] Pager [phone]  03/21/2020, 6:56 PM

## 2020-03-21 NOTE — ED Provider Notes (Signed)
Signout from Dr. Ronnald Nian.  54 year old female here with right arm weakness for a few days.  Also some shortness of breath.  CT suspicious for acute infarct.  Neurology is consulted on the patient.  Plan is to call hospitalist for admission. Physical Exam  BP 125/71   Pulse 90   Temp 98.3 F (36.8 C) (Oral)   Resp 16   LMP 04/24/2014   SpO2 100%   Physical Exam  ED Course/Procedures     Procedures  MDM  Discussed with Dr. Roosevelt Locks [facility] who will evaluate the patient for admission.       Hayden Rasmussen, MD 03/21/20 [phone]

## 2020-03-21 NOTE — ED Provider Notes (Signed)
[facility] EMERGENCY DEPARTMENT Provider Note   CSN: [id number] Arrival date & time: 03/21/20  7026     History Chief Complaint  Patient presents with  . Shortness of Breath  . Weakness    Sharon Blankenship is a 53 y.o. female.  Patient here for shortness of breath for the last several days but on and off. States that is worse when she is wearing her mask. She denies any cough or sputum production. No chest pain. She has been on acetazolamide for glaucoma and feels dehydrated. She also has had some right arm weakness and some numbness. Mostly in the hand and lower arm but felt like her whole arm is heavy at times. Denies any speech changes, facial droop, leg weakness. Has been walking without any issues. 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 Denies known history of carpal tunnel.  The history is provided by the patient.  Weakness Severity:  Mild Onset quality:  Gradual Duration:  2 days Timing:  Constant Progression:  Unchanged Chronicity:  New Relieved by:  Nothing Worsened by:  Nothing Associated symptoms: extremity numbness and shortness of breath   Associated symptoms: no abdominal pain, no arthralgias, no chest pain, no cough, no diarrhea, no dizziness, no dysuria, no fever, no headaches, no loss of consciousness, no seizures and no vomiting        Past Medical History:  Diagnosis Date  . Diabetes mellitus without complication ([place])   . Hypertension     There are no problems to display for this patient.   Past Surgical History:  Procedure Laterality Date  . ABDOMINAL HYSTERECTOMY     2016     OB History   No obstetric history on file.     No family history on file.  Social History   Tobacco Use  . Smoking status: Never Smoker  . Smokeless tobacco: Never Used  Substance Use Topics  . Alcohol use: Never  . Drug use: Never    Home Medications Prior to Admission medications    Medication Sig Start Date End Date Taking? Authorizing Provider  acetaminophen (TYLENOL) 500 MG tablet Take 1,000 mg by mouth every 6 (six) hours as needed for moderate pain.    [provider]  cetirizine (ZYRTEC) 10 MG tablet Take 10 mg by mouth daily as needed for allergies.    [provider]  cyclobenzaprine (FLEXERIL) 10 MG tablet Take 10 mg by mouth 3 (three) times daily as needed for muscle spasms.    [provider]  docusate sodium (COLACE) 100 MG capsule Take 100 mg by mouth 3 (three) times daily.    [provider]  Dulaglutide (TRULICITY [place]) Inject into the skin.    [provider]  ferrous sulfate 325 (65 FE) MG tablet Take 325 mg by mouth 3 (three) times daily with meals.    [provider]  glipiZIDE (GLUCOTROL) 10 MG tablet Take 10 mg by mouth 2 (two) times daily before a meal.    [provider]  HYDROCHLOROTHIAZIDE PO Take by mouth.    [provider]  ibuprofen (ADVIL,MOTRIN) 800 MG tablet Take 800 mg by mouth every 8 (eight) hours as needed for moderate pain.    [provider]  Insulin Glargine 300 UNIT/ML SOPN Inject 85 Units into the skin daily.    [provider]  losartan (COZAAR) 100 MG tablet Take 100 mg by mouth daily.  [provider]  metFORMIN (GLUCOPHAGE) 1000 MG tablet Take 1,000 mg by mouth 2 (two) times daily with a meal.    [provider]  polyethylene glycol (MIRALAX / GLYCOLAX) packet Take 17 g by mouth daily as needed for mild constipation or moderate constipation.    [provider]    Allergies    Penicillins  Review of Systems   Review of Systems  Constitutional: Negative for chills and fever.  HENT: Negative for ear pain and sore throat.   Eyes: Negative for pain and visual disturbance.  Respiratory: Positive for shortness of breath. Negative for cough and wheezing.   Cardiovascular: Negative for chest pain and palpitations.   Gastrointestinal: Negative for abdominal pain, diarrhea and vomiting.  Genitourinary: Negative for dysuria and hematuria.  Musculoskeletal: Negative for arthralgias and back pain.  Skin: Negative for color change and rash.  Neurological: Positive for weakness and numbness. Negative for dizziness, tremors, seizures, loss of consciousness, syncope, facial asymmetry, speech difficulty, light-headedness and headaches.  All other systems reviewed and are negative.   Physical Exam Updated Vital Signs  ED Triage Vitals  Enc Vitals Group     BP 03/21/20 0710 130/82     Pulse Rate 03/21/20 0710 (!) 121     Resp 03/21/20 0710 19     Temp 03/21/20 0710 98.3 F (36.8 C)     Temp Source 03/21/20 0710 Oral     SpO2 03/21/20 0710 100 %     Weight --      Height --      Head Circumference --      Peak Flow --      Pain Score 03/21/20 0717 3     Pain Loc --      Pain Edu? --      Excl. in [place]? --     Physical Exam Vitals and nursing note reviewed.  Constitutional:      General: She is not in acute distress.    Appearance: She is well-developed and well-nourished. She is not ill-appearing.  HENT:     Head: Normocephalic and atraumatic.     Mouth/Throat:     Mouth: Mucous membranes are moist.  Eyes:     Conjunctiva/sclera: Conjunctivae normal.     Pupils: Pupils are equal, round, and reactive to light.  Cardiovascular:     Rate and Rhythm: Normal rate and regular rhythm.     Pulses: Normal pulses.     Heart sounds: Normal heart sounds. No murmur heard.   Pulmonary:     Effort: Pulmonary effort is normal. No respiratory distress.     Breath sounds: Normal breath sounds. No wheezing.  Abdominal:     Palpations: Abdomen is soft.     Tenderness: There is no abdominal tenderness.  Musculoskeletal:        General: No edema. Normal range of motion.     Cervical back: Normal range of motion and neck supple.     Right lower leg: No edema.     Left lower leg: No edema.  Skin:     General: Skin is warm and dry.     Capillary Refill: Capillary refill takes less than 2 seconds.  Neurological:     General: No focal deficit present.     Mental Status: She is alert and oriented to person, place, and time.     Cranial Nerves: No cranial nerve deficit.     Comments: 5+/5 strength throughout except in right hand with some  mild weakness/appear to be focal to the wrist down, She states she has some numbness over the right forearm but otherwise normal sensation throughout, normal speech, no facial droop, normal finger-to-nose finger on the left but struggles on the right  Psychiatric:        Mood and Affect: Mood and affect and mood normal.     ED Results / Procedures / Treatments   Labs (all labs ordered are listed, but only abnormal results are displayed) Labs Reviewed  BASIC METABOLIC PANEL - Abnormal; Notable for the following components:      Result Value   Potassium 3.4 (*)    Chloride 114 (*)    CO2 13 (*)    BUN 22 (*)    Creatinine, Ser 1.47 (*)    GFR, Estimated 43 (*)    All other components within normal limits  CBC - Abnormal; Notable for the following components:   Hemoglobin 11.0 (*)    HCT 32.7 (*)    All other components within normal limits  HEPATIC FUNCTION PANEL - Abnormal; Notable for the following components:   ALT 61 (*)    Alkaline Phosphatase 136 (*)    All other components within normal limits  RESP PANEL BY RT-PCR (FLU A&B, COVID) ARPGX2  BRAIN NATRIURETIC PEPTIDE  URINALYSIS, ROUTINE W REFLEX MICROSCOPIC  LIPID PANEL  HEMOGLOBIN A1C  TROPONIN I (HIGH SENSITIVITY)    EKG EKG Interpretation  Date/Time:  Friday March 21 2020 07:01:12 EST Ventricular Rate:  120 PR Interval:  138 QRS Duration: 80 QT Interval:  310 QTC Calculation: 438 R Axis:   33 Text Interpretation: Sinus tachycardia ST & T wave abnormality, consider inferolateral ischemia Abnormal ECG Confirmed by Lennice Sites [phone]) on 03/21/2020 11:11:33  AM   Radiology DG Chest 2 View  Result Date: 03/21/2020 CLINICAL DATA:  shortness of breath EXAM: CHEST - 2 VIEW COMPARISON:  None. FINDINGS: Mild hypoinflation. Patchy retrocardiac opacities. No pneumothorax or pleural effusion. Cardiomediastinal silhouette is within normal limits. Multilevel spondylosis. IMPRESSION: Patchy retrocardiac opacities, atelectasis versus infiltrate. Mild hypoinflation. Electronically Signed   By: Primitivo Gauze M.D.   On: 03/21/2020 08:07   CT Head Wo Contrast  Result Date: 03/21/2020 CLINICAL DATA:  Right greater than left-sided weakness for the past 3 days. EXAM: CT HEAD WITHOUT CONTRAST TECHNIQUE: Contiguous axial images were obtained from the base of the skull through the vertex without intravenous contrast. COMPARISON:  None. FINDINGS: Brain: Small area of patchy hypodensity in the left frontal lobe with loss of the normal gray-white matter differentiation (series 3, images 8-11). Additional small patchy white matter hypodensity in the left occipital lobe. No evidence of hemorrhage, hydrocephalus, extra-axial collection or mass lesion/mass effect. Vascular: Calcified atherosclerosis at the skullbase. No hyperdense vessel. Skull: Normal. Negative for fracture or focal lesion. Sinuses/Orbits: No acute finding. Old left lamina papyracea fracture with intraorbital fat herniation medially. Other: None. IMPRESSION: 1. Small area of patchy hypodensity in the left frontal lobe with loss of the normal gray-white matter differentiation, concerning for acute to subacute infarct given clinical history. 2. Age indeterminate small white matter infarct in the left occipital lobe. Electronically Signed   By: Titus Dubin M.D.   On: 03/21/2020 13:02    Procedures .Critical Care Performed by: Lennice Sites, DO Authorized by: Lennice Sites, DO   Critical care provider statement:    Critical care time (minutes):  45   Critical care was necessary to treat or prevent  imminent or life-threatening deterioration of the following  conditions:  CNS failure or compromise   Critical care was time spent personally by me on the following activities:  Discussions with consultants, evaluation of patient's response to treatment, examination of patient, ordering and performing treatments and interventions, ordering and review of laboratory studies, ordering and review of radiographic studies, pulse oximetry, re-evaluation of patient's condition, obtaining history from patient or surrogate and review of old charts   (including critical care time)  Medications Ordered in ED Medications  sodium chloride 0.9 % bolus 1,000 mL (1,000 mLs Intravenous New Bag/Given 03/21/20 1155)    ED Course  I have reviewed the triage vital signs and the nursing notes.  Pertinent labs & imaging results that were available during my care of the patient were reviewed by me and considered in my medical decision making (see chart for details).    MDM Rules/Calculators/A&P                          Borghild Thaker is a 54 year old female history of hypertension, diabetes who presents to the ED with shortness of breath and right arm weakness.  Patient with some intermittent shortness of breath.  No cough, no fever, no sputum production.  No leukocytosis, no fever.  Chest x-ray without any obvious signs of infection.  Will check for Covid.  Troponin normal.  EKG shows sinus rhythm.  No ischemic changes.  BNP normal and doubt heart failure.  No PE risk factors and doubt PE.  Patient also mostly concerned about right upper arm weakness.  Has been ongoing for the last 2 days.  Feels like it is mostly in the right hand now.  No speech problems or leg weakness.  Does have some numbness of the right arm.  Weakness appears to be in the wrist and below but will get a CT scan MRI to evaluate for stroke.  CT scan shows small area of hypodensity in the left frontal lobe concerning for possible acute or subacute  stroke.  Talked with neurology on the phone and will get MRI and MRA to further evaluate.  But overall suspect a stroke given exam and CT finding.  This chart was dictated using voice recognition software.  Despite best efforts to proofread,  errors can occur which can change the documentation meaning.     Final Clinical Impression(s) / ED Diagnoses Final diagnoses:  Right arm weakness    Rx / DC Orders ED Discharge Orders    None       Lennice Sites, DO 03/21/20 1520

## 2020-03-21 NOTE — Consult Note (Signed)
Neurology Consultation  Reason for Consult: Right hand weakness x 3 days, hypodensity seen on CT head Referring Physician: Dr. Ronnald Nian  CC: "I have had right hand weakness and numbness intermittently for about 2 days now"  History is obtained from: patient and patient's significant other at bedside  HPI: Sharon Blankenship is a 54 y.o. female with a medical history significant for obesity, glaucoma, hypertension, and type 2 diabetes who presented to [facility] ED with complaints of approximately 2 days of intermittent numbness and weakness of her right upper extremity (hand weaker than right upper arm). Per her spouse, he has noticed her "stumbling around the apartment" and increased confusion and slurred speech he first noticed last night 12/16. Sharon Blankenship states she was started on acetazolamide for her glaucoma about 3 months ago and noted double vision, confusion, dizziness, and incoordination with ambulation since starting this medication. She states when she gets up to use the restroom at night she has to "hold onto the walls" and that the medication has made her experience generalized weakness.   LKW: Approximately 2 days ago, exact time unknown (symptoms intermittent) tpa given?: no, outside of time windo  ROS: A complete ROS was performed and is negative except as noted in the HPI.  Past Medical History:  Diagnosis Date  . Diabetes mellitus without complication ([place])   . Hypertension    Family History: - Mother: type 2 DM, HTN, CHF, died of MI at age 23 - Father: type 2 DM, HTN, CHF, died of MI at age 52 - Brother: debilitating CVA  - Aunts (paternal and maternal)- CVA - Uncles (paternal and maternal)- CVA  Social History:   reports that she has never smoked. She has never used smokeless tobacco. She reports that she does not drink alcohol and does not use drugs.  Medications Current Outpatient Medications  Medication Instructions  . acetaminophen (TYLENOL) 1,000 mg, Oral, Every 6  hours PRN  . cetirizine (ZYRTEC) 10 mg, Oral, Daily PRN  . cyclobenzaprine (FLEXERIL) 10 mg, Oral, 3 times daily PRN  . docusate sodium (COLACE) 100 mg, Oral, 3 times daily  . Dulaglutide (TRULICITY [place]) Subcutaneous  . ferrous sulfate 325 mg, Oral, 3 times daily with meals  . glipiZIDE (GLUCOTROL) 10 mg, Oral, 2 times daily before meals  . HYDROCHLOROTHIAZIDE PO Oral  . ibuprofen (ADVIL) 800 mg, Oral, Every 8 hours PRN  . Insulin Glargine 85 Units, Subcutaneous, Daily  . losartan (COZAAR) 100 mg, Oral, Daily  . metFORMIN (GLUCOPHAGE) 1,000 mg, Oral, 2 times daily with meals  . polyethylene glycol (MIRALAX / GLYCOLAX) 17 g, Oral, Daily PRN    Assessment: Exam: Current vital signs: BP (!) 152/87   Pulse 100   Temp 98.3 F (36.8 C) (Oral)   Resp 16   LMP 04/24/2014   SpO2 100%  Vital signs in last 24 hours: Temp:  [98.3 F (36.8 C)] 98.3 F (36.8 C) (12/17 0710) Pulse Rate:  [97-121] 100 (12/17 1200) Resp:  [13-19] 16 (12/17 1200) BP: (111-152)/(80-87) 152/87 (12/17 1130) SpO2:  [97 %-100 %] 100 % (12/17 1200)  GENERAL: Awake, alert in NAD, calm and cooperative with exam Psychiatric: Somewhat flat affect, cooperative Eyes: No scleral injection HENT: - Normocephalic and atraumatic, dry mm.  LUNGS - Normal respiratory effort. Symmetric chest rise with respirations. No audible wheezing.  CV - Regular rate on telemetry, extremities warm and without edema ABDOMEN - Soft, nontender, obese Musculoskeletal: No significant joint deformities Skin: Warm dry and intact  NEURO:  Mental  Status: awake, alert, and oriented to person, place, time, and situation.  Speech/Language: speech is clear without aphasia or slurring.  Naming, repetition, fluency, and comprehension intact. Cranial Nerves:  II: PERRL 60mm --> 83mm/brisk. Peripheral vision impaired bilaterally. Blink to threat absent bilaterally in peripheral visual fields.   III, IV, VI: EOMI. Lid elevation symmetric and full.  V:  Sensation is intact and symmetrical to face. Moves jaw back and forth.  VII: Face is symmetric resting and smiling. Able to puff cheeks and raise eyebrows.  VIII: Hearing intact to voice IX, X: Palate elevation is symmetric. Phonation normal.  XI: Normal sternocleidomastoid and trapezius muscle strength XII: Tongue is symmetrical without fasciculations.   Motor: 5/5 strength present in bilateral lower extremities and bilateral upper extremities. Left upper extremity slightly stronger than right upper extremity. RUE hand and wrist 4/5 strength (weaker distally). Finger abduction stronger on left hand than right (3/5).  Tone is normal. Bulk is normal.  Sensation- Intact to light touch, pinprick bilaterally in all four extremities. Extinction intact. 2 point discrimination. Impaired vibratory sensation distally in the hands bilaterally and present in the elbows bilaterally. Cool temperature sensation intact in upper and lower extremities but increases with more proximal position. Coordination: FTN intact bilaterally. HKS intact bilaterally. No pronator drift.  DTRs: 2+ brachial and radial, 1+ patellae.   Gait- deferred  1a Level of Conscious.: 0 1b LOC Questions: 0 1c LOC Commands: 0 2 Best Gaze: 0 3 Visual: 1 4 Facial Palsy: 0 5a Motor Arm - left: 0 5b Motor Arm - Right: 0 6a Motor Leg - Left: 0 6b Motor Leg - Right: 0 7 Limb Ataxia: 0 8 Sensory: 0 9 Best Language: 0 10 Dysarthria: 0 11 Extinct. and Inatten.: 0 TOTAL: 0  Labs I have reviewed labs in epic and the results pertinent to this consultation are: Creatinine 1.47, GFR 43    Imaging I have reviewed the images obtained:  CT-scan of the brain personally reviewed, agree with radiology IMPRESSION:  1. Small area of patchy hypodensity in the left frontal lobe with loss of the normal gray-white matter differentiation, concerning for acute to subacute infarct given clinical history. 2. Age indeterminate small white matter  infarct in the left occipital lobe.  MRI/MRA examination of the brain and neck personally reviewed, agree with radiology read:  1. Numerous acute infarctions affecting the cortical and subcortical brain extending from front to back on the left at the junction of the ACA and MCA territories consistent with watershed distribution [or embolic] infarctions. No evidence of mass effect or hemorrhage. 2. Severe narrowing of the proximal M2 branch vessels on the left, which could be due to plaque or nonocclusive embolus. 3. Intracranial MR angiography of the neck vessels shows only minimal atherosclerotic change at the carotid bifurcations without flow limiting stenosis or pronounced irregularity. 4. Mild atherosclerotic narrowing of the proximal M2 branches on the right. Atherosclerotic narrowing of the right superior cerebellar artery. Mild atherosclerotic irregularity of the more distal PCA branches right more than left.  Impression: Sharon Blankenship is a 54 year old female with a history significant for glaucoma, hypertension, and insulin-dependent diabetes mellitus type 2 who presented to the ED today with an approximate 2 day history of intermittent numbness and weakness in her right upper extremity. Per family at bedside, she has also had increased intermittent confusion and slurred speech last noted 12/16 in the evening hours. CTH obtained in the ED demonstrated a small area hypodensity concerning for a left frontal lobe stroke.  Review of MRI/MRA suggests this is most likely atheroembolic phenomena with symptomatic intra-cerebral atherosclerosis, for which I will initiate DAPT for 90-day course, with aspirin monotherapy to be continued thereafter.  Recommendations: #Left MCA territory scattered embolic strokes - HTDS2A, fasting lipid panel - Frequent neuro checks - Echocardiogram - Prophylactic therapy- patient on Aspirin 81mg  at home.  - Clopidogrel 300 mg once; 75 mg/day PO x 90 days - Risk  factor modification, patient counseled on diet, exercise and weight loss - Telemetry monitoring - PT consult, OT consult, Speech consult - Stroke team to follow  Pt seen by NP/Neuro and later by MD. Note/plan to be edited by MD as needed.  Anibal Henderson, AGAC-NP [facility] Pager: [phone]  Attending Neurologist's note:  I personally saw this patient, gathering history, performing a full neurologic examination, reviewing relevant labs, personally reviewing relevant imaging including head CT, MRI, and MRA and formulated the assessment and plan, adding the note above for completeness and clarity to accurately reflect my thoughts  Lesleigh Noe MD-PhD [facility] [phone]

## 2020-03-22 ENCOUNTER — Inpatient Hospital Stay (HOSPITAL_COMMUNITY): Payer: BLUE CROSS/BLUE SHIELD

## 2020-03-22 DIAGNOSIS — I63412 Cerebral infarction due to embolism of left middle cerebral artery: Principal | ICD-10-CM

## 2020-03-22 DIAGNOSIS — R29898 Other symptoms and signs involving the musculoskeletal system: Secondary | ICD-10-CM

## 2020-03-22 LAB — GLUCOSE, CAPILLARY
Glucose-Capillary: 175 mg/dL — ABNORMAL HIGH (ref 70–99)
Glucose-Capillary: 203 mg/dL — ABNORMAL HIGH (ref 70–99)

## 2020-03-22 LAB — BASIC METABOLIC PANEL
Anion gap: 15 (ref 5–15)
BUN: 21 mg/dL — ABNORMAL HIGH (ref 6–20)
CO2: 12 mmol/L — ABNORMAL LOW (ref 22–32)
Calcium: 9.1 mg/dL (ref 8.9–10.3)
Chloride: 115 mmol/L — ABNORMAL HIGH (ref 98–111)
Creatinine, Ser: 1.22 mg/dL — ABNORMAL HIGH (ref 0.44–1.00)
GFR, Estimated: 53 mL/min — ABNORMAL LOW (ref >60)
Glucose, Bld: 115 mg/dL — ABNORMAL HIGH (ref 70–99)
Potassium: 3.3 mmol/L — ABNORMAL LOW (ref 3.5–5.1)
Sodium: 142 mmol/L (ref 135–145)

## 2020-03-22 LAB — URINALYSIS, ROUTINE W REFLEX MICROSCOPIC
Bilirubin Urine: NEGATIVE
Glucose, UA: NEGATIVE mg/dL
Hgb urine dipstick: NEGATIVE
Ketones, ur: NEGATIVE mg/dL
Nitrite: NEGATIVE
Protein, ur: NEGATIVE mg/dL
Specific Gravity, Urine: 1.016 (ref 1.005–1.030)
pH: 5 (ref 5.0–8.0)

## 2020-03-22 LAB — LIPID PANEL
Cholesterol: 201 mg/dL — ABNORMAL HIGH (ref 0–200)
HDL: 24 mg/dL — ABNORMAL LOW (ref >40)
LDL Cholesterol: 147 mg/dL — ABNORMAL HIGH (ref 0–99)
Total CHOL/HDL Ratio: 8.4 RATIO
Triglycerides: 152 mg/dL — ABNORMAL HIGH (ref <150)
VLDL: 30 mg/dL (ref 0–40)

## 2020-03-22 LAB — HEMOGLOBIN A1C
Hgb A1c MFr Bld: 6.3 % — ABNORMAL HIGH (ref 4.8–5.6)
Mean Plasma Glucose: 134.11 mg/dL

## 2020-03-22 LAB — CBG MONITORING, ED
Glucose-Capillary: 115 mg/dL — ABNORMAL HIGH (ref 70–99)
Glucose-Capillary: 124 mg/dL — ABNORMAL HIGH (ref 70–99)

## 2020-03-22 LAB — HIV ANTIBODY (ROUTINE TESTING W REFLEX): HIV Screen 4th Generation wRfx: NONREACTIVE

## 2020-03-22 IMAGING — CT CT ANGIO HEAD
1 of 11 series · 5 of 33 positions shown · IV contrast (OMNI 350)
Comparison: MRI and MRA from [DATE].

CLINICAL DATA: Stroke/TIA.  Evaluate embolic source.

EXAM:
CT ANGIOGRAPHY HEAD AND NECK
TECHNIQUE: Multidetector CT imaging of the head and neck was performed using
the standard protocol during bolus administration of intravenous
contrast. Multiplanar CT image reconstructions and MIPs were
obtained to evaluate the vascular anatomy. Carotid stenosis
measurements (when applicable) are obtained utilizing NASCET
criteria, using the distal internal carotid diameter as the
denominator.
CONTRAST:  75mL OMNIPAQUE IOHEXOL 350 MG/ML SOLN

[Series 13: cta neck axial · axial · 0.41mm/px · z∈[-301,-44]mm · 5 of 387 slices shown]
[im 65/387  soft-tissue]
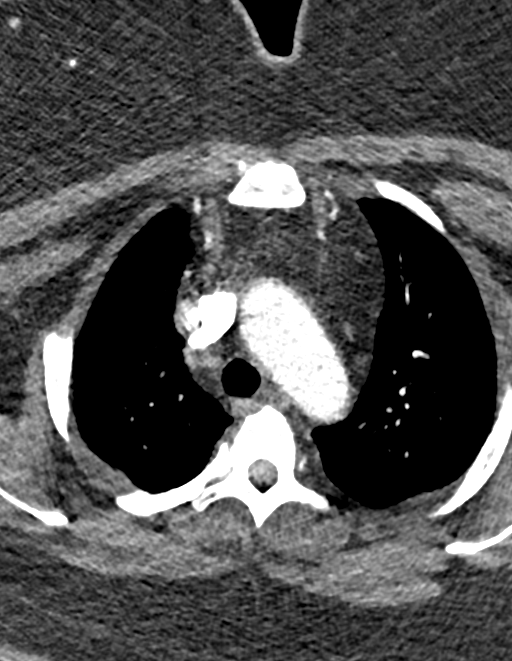
[im 129/387  bone]
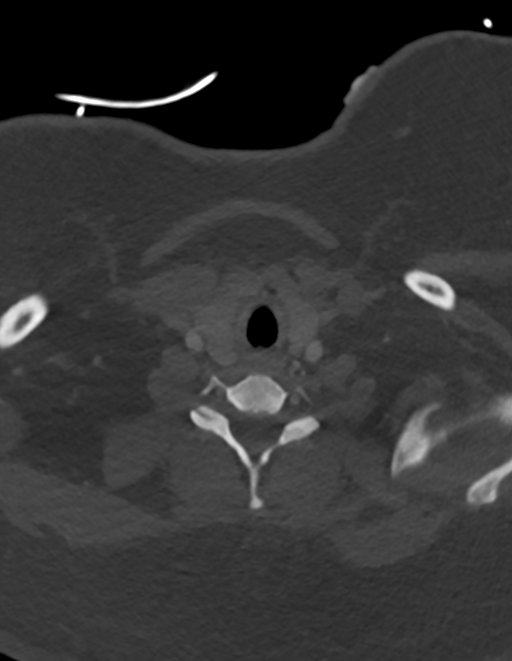
[im 194/387  soft-tissue]
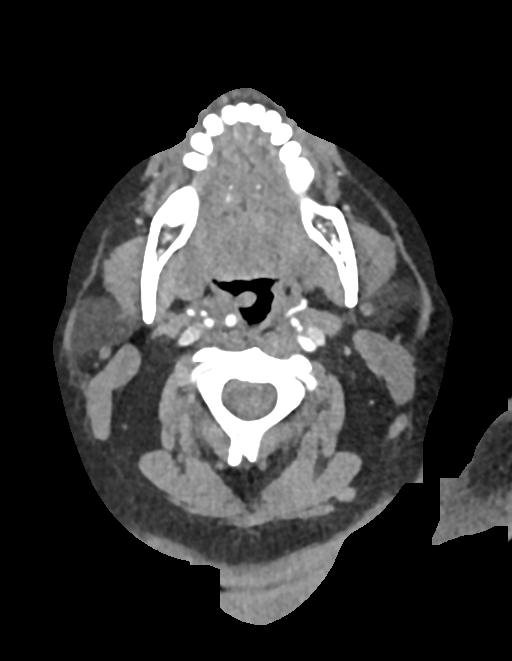
[im 258/387  bone]
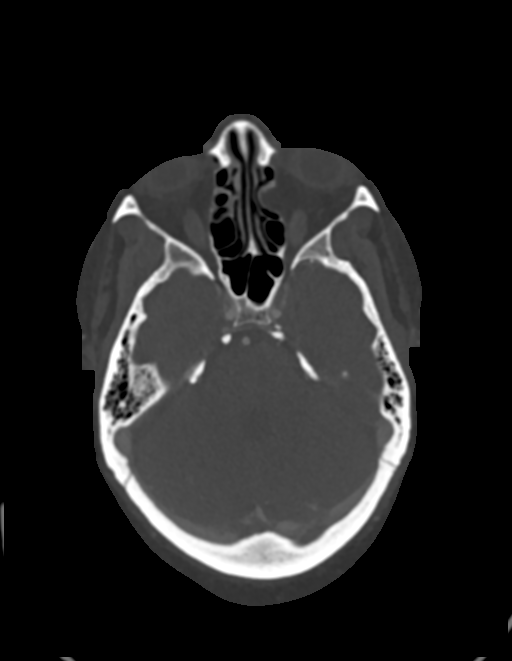
[im 322/387  soft-tissue]
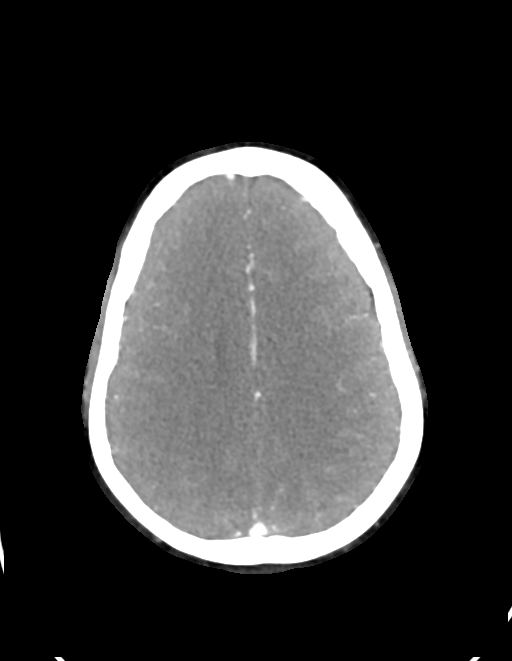

[5 of 33 positions shown; findings below may reference images not displayed]

FINDINGS: CT HEAD FINDINGS

Brain: Multiple areas of edema and loss of gray differentiation in
the cortical and subcortical left MCA and ACA territories,
compatible with acute/subacute infarcts that were better
characterized on recent MRI. No progressive mass effect. No midline
shift. No evidence of acute mass occupying hemorrhagic
transformation. No hydrocephalus. No mass lesion. No extra-axial
fluid collection.

Vascular: Calcific atherosclerosis.

Skull: No acute fracture.

Sinuses: Sinuses are clear.

Orbits: Remote left medial orbital wall fracture.

Review of the MIP images confirms the above findings

CTA NECK FINDINGS

Aortic arch: Great vessels are patent.

Right carotid system: No evidence of dissection, stenosis (50% or
greater) or occlusion. Atherosclerosis at the carotid bifurcation
and involving the proximal internal carotid artery without greater
than 50% narrowing.

Left carotid system: Mixed calcific and noncalcific atherosclerosis
with mild ulceration at the carotid bifurcation and approximately
45% stenosis of the proximal internal carotid artery. No evidence of
greater than 50% stenosis.

Vertebral arteries: Codominant. Evaluation is limited by streak
artifact from adjacent pooled contrast; however, there is likely
severe stenosis of the left vertebral artery origin. Remainder of
the left vertebral artery is patent. No evidence of significant
stenosis on the right.

Skeleton: No acute findings.

Other neck: No mass suspicious adenopathy.

Upper chest: No acute findings.

Review of the MIP images confirms the above findings

CTA HEAD FINDINGS

Anterior circulation: Comparison to recent MRA, similar severe
narrowing of proximal left M2 branches. Otherwise, no
hemodynamically significant stenosis in the anterior circulation.

Posterior circulation: No significant stenosis, proximal occlusion,
aneurysm, or vascular malformation. Mild calcific atherosclerosis of
the right intradural vertebral artery. Mild narrowing of the left
PCA.

Venous sinuses: As permitted by contrast timing, patent.

Review of the MIP images confirms the above findings
IMPRESSION: 1. Evolving multifocal acute/subacute infarcts in the left cortical
and subcortical brain (MCA/ACA watershed territory), better
characterized on recent MRI. No progressive mass effect or
hemorrhagic transformation.
2. In comparison to recent MRA, similar appearance of severe
narrowing of proximal left M2 MCA branches.
3. Bilateral carotid bifurcation atherosclerosis with approximately
45% narrowing on the left.
4. Evaluation is limited by streak artifact from adjacent pooled
contrast; however, there is likely severe stenosis of the left
vertebral artery origin.

## 2020-03-22 MED ORDER — IOHEXOL 350 MG/ML SOLN
75.0000 mL | Freq: Once | INTRAVENOUS | Status: AC | PRN
  Administered 2020-03-22: 75 mL via INTRAVENOUS

## 2020-03-22 MED ORDER — FLUTICASONE PROPIONATE 50 MCG/ACT NA SUSP
1.0000 | Freq: Every day | NASAL | Status: DC
  Administered 2020-03-23: 1 via NASAL
  Filled 2020-03-22: qty 16

## 2020-03-22 MED ORDER — POLYETHYLENE GLYCOL 3350 17 G PO PACK: 17.0000 g | PACK | Freq: Every day | ORAL | Status: DC | PRN

## 2020-03-22 MED ORDER — SALINE SPRAY 0.65 % NA SOLN
1.0000 | NASAL | Status: DC | PRN
  Filled 2020-03-22: qty 44

## 2020-03-22 MED ORDER — ATORVASTATIN CALCIUM 80 MG PO TABS
80.0000 mg | ORAL_TABLET | Freq: Every day | ORAL | Status: DC
  Administered 2020-03-22 – 2020-03-23 (×2): 80 mg via ORAL
  Filled 2020-03-22 (×2): qty 1

## 2020-03-22 MED ORDER — MONTELUKAST SODIUM 10 MG PO TABS
10.0000 mg | ORAL_TABLET | Freq: Every day | ORAL | Status: DC
  Administered 2020-03-22: 10 mg via ORAL
  Filled 2020-03-22: qty 1

## 2020-03-22 NOTE — Evaluation (Signed)
Physical Therapy Evaluation Patient Details Name: Sharon Blankenship MRN: [id number] DOB: Aug 11, 1965 Today's Date: 03/22/2020   History of Present Illness  Sharon Blankenship is a 54 y.o. female with medical history significant of HTN, IDDM, chronic closed angle glaucoma, morbid obesity, presented with new onset right-sided weakness. Her symptoms started 2 days ago, initially was intermittent then became constant. Symptoms involve right-sided weakness mainly on the right arm, associated with numbness. Patient was recently started on acetazolamide for her worsening glaucoma, and she has been having intermittent weakness dizziness and confusion, which she attributed to the side effect of medication. MRI shows numerous acute infarcts in cortical and subcortical brain. in [place] and MCA territories.  Clinical Impression  Patient received in bed, agreeable to PT assessment. Reports R E feeling weak. Patient reports she has ben up to bathroom. She is mod independent with bed mobility, supervision for transfers and gait. R UE with decreased coordination. She will continue to benefit from PT follow up to ensure safety with mobility.   Follow Up Recommendations      Equipment Recommendations  None recommended by PT    Recommendations for Other Services       Precautions / Restrictions Precautions Precautions: Fall Restrictions Weight Bearing Restrictions: No      Mobility  Bed Mobility Overal bed mobility: Modified Independent                  Transfers Overall transfer level: Needs assistance Equipment used: None Transfers: Sit to/from Stand Sit to Stand: Supervision            Ambulation/Gait Ambulation/Gait assistance: Supervision Gait Distance (Feet): 35 Feet Assistive device: None Gait Pattern/deviations: Step-through pattern;Decreased step length - left;Decreased step length - right Gait velocity: decr   General Gait Details: generally cautious gait, no overt lob  Stairs             Wheelchair Mobility    Modified Rankin (Stroke Patients Only)       Balance Overall balance assessment: Mild deficits observed, not formally tested                                           Pertinent Vitals/Pain Pain Assessment: No/denies pain    Home Living Family/patient expects to be discharged to:: Private residence Living Arrangements: Alone Available Help at Discharge: Family;Available PRN/intermittently Type of Home: House Home Access: Stairs to enter   [employer] of Steps: 2 Home Layout: One level Home Equipment: None      Prior Function Level of Independence: Independent               Hand Dominance   Dominant Hand: Right    Extremity/Trunk Assessment   Upper Extremity Assessment Upper Extremity Assessment: RUE deficits/detail RUE Deficits / Details: slight weakness in R UE RUE Coordination: decreased gross motor    Lower Extremity Assessment Lower Extremity Assessment: Overall WFL for tasks assessed       Communication   Communication: No difficulties  Cognition Arousal/Alertness: Awake/alert Behavior During Therapy: WFL for tasks assessed/performed Overall Cognitive Status: Within Functional Limits for tasks assessed                                        General Comments      Exercises  Assessment/Plan    PT Assessment Patient needs continued PT services  PT Problem List Decreased strength;Decreased mobility;Decreased activity tolerance;Decreased balance       PT Treatment Interventions DME instruction;Therapeutic activities;Gait training;Therapeutic exercise;Patient/family education;Functional mobility training;Stair training    PT Goals (Current goals can be found in the Care Plan section)  Acute Rehab PT Goals Patient Stated Goal: to return home PT Goal Formulation: With patient Time For Goal Achievement: 03/29/20 Potential to Achieve Goals: Good     Frequency Min 3X/week   Barriers to discharge Decreased caregiver support      Co-evaluation               AM-PAC PT "6 Clicks" Mobility  Outcome Measure Help needed turning from your back to your side while in a flat bed without using bedrails?: None Help needed moving from lying on your back to sitting on the side of a flat bed without using bedrails?: None Help needed moving to and from a bed to a chair (including a wheelchair)?: A Little Help needed standing up from a chair using your arms (e.g., wheelchair or bedside chair)?: A Little Help needed to walk in hospital room?: A Little Help needed climbing 3-5 steps with a railing? : A Little 6 Click Score: 20    End of Session   Activity Tolerance: Patient tolerated treatment well Patient left: in bed;with call bell/phone within reach;with bed alarm set Nurse Communication: Mobility status PT Visit Diagnosis: Other abnormalities of gait and mobility (R26.89);Difficulty in walking, not elsewhere classified (R26.2)    Time: 6394-3200 PT Time Calculation (min) (ACUTE ONLY): 18 min   Charges:   PT Evaluation $PT Eval Moderate Complexity: 1 Mod PT Treatments $Gait Training: 8-22 mins        Tamerra Merkley, PT, GCS 03/22/20,2:30 PM

## 2020-03-22 NOTE — Progress Notes (Signed)
[facility] Progress Note  Patient: Sharon Blankenship    PRF:[id number]  DOA: 03/21/2020     Date of Service: the patient was seen and examined on 03/22/2020  Brief hospital course: Past medical history of obesity, glaucoma, HTN, type II DM.  Presents with complaints of 2 days of numbness and found to have acute CVA. Currently plan is further work-up for stroke.  Assessment and Plan: 1.  Multiple left MCA territory embolic stroke Likely combination of symptomatic MCA territory stenosis and hypertension on diuretics CTA small patchy hypodensity on left frontal lobe MRI brain acute infarction in ACA MCA watershed territory. MRA head severe narrowing of proximal M2 CTA head and neck ordered. Echocardiogram currently pending. LDL 159 on Lipitor 80 mg. Hemoglobin A1c 6.6. Passed swallowing evaluation currently on diet. No medication prior to admission currently on aspirin and Plavix.  2.  Essential hypertension Blood pressure mildly elevated right now. Permissive hypertension. Home medication HCTZ and Cozaar. Monitor. Recently was started on acetazolamide for glaucoma for 2 months which corrected her blood pressure to almost normal, unsure if that contributed to this watershed infarct.  3.  Type 2 diabetes mellitus uncontrolled with hyperglycemia with hyperlipidemia Recently sugar control significantly improved. Continue current sliding scale. Continue home regimen on discharge.  4.  Glaucoma Continue eyedrops. Recently started on acetazolamide. Likely cause of patient's metabolic acidosis. Monitor.  5.  Hypokalemia, metabolic acidosis, acute kidney injury Likely secondary to acetazolamide. Currently on hold. On sodium bicarb. We will monitor.  6.  Obesity Placing the patient at high risk for poor outcome. Patient is reporting some headache but does not have any daytime sleepiness or fatigue or any other sleep apnea symptoms. Still given BMI patient will benefit from  outpatient sleep study to minimize her stroke risk. Body mass index is 39.57 kg/m.    Interventions:        Diet: Cardiac diet DVT Prophylaxis:   enoxaparin (LOVENOX) injection 40 mg Start: 03/21/20 2200    Advance goals of care discussion: Full code  Family Communication: no family was present at bedside, at the time of interview.   Disposition:  Status is: Inpatient  Remains inpatient appropriate because:Ongoing diagnostic testing needed not appropriate for outpatient work up   Dispo: The patient is from: Home              Anticipated d/c is to: Home              Anticipated d/c date is: 3 days              Patient currently is not medically stable to d/c.        Subjective: No nausea no vomiting or no fever no chills.  Reports runny nose.  No chest pain.  Physical Exam:  General: Appear in mild distress, no Rash; Oral Mucosa Clear, moist. no Abnormal Neck Mass Or lumps, Conjunctiva normal  Cardiovascular: S1 and S2 Present, no Murmur, Respiratory: good respiratory effort, Bilateral Air entry present and CTA, no Crackles, no wheezes Abdomen: Bowel Sound present, Soft and no tenderness Extremities: no Pedal edema Neurology: alert and oriented to time, place, and person affect appropriate. no new focal deficit Gait not checked due to patient safety concerns  Vitals:   03/22/20 1230 03/22/20 1327 03/22/20 1327 03/22/20 1520  BP: (!) 147/84 (!) 151/79 (!) 151/79 131/63  Pulse: 94 97 97 84  Resp: 16 18 18 18   Temp:  98.2 F (36.8 C) 98.2 F (36.8 C) 98 F (36.7  C)  TempSrc:  Oral Oral Oral  SpO2: 100% 100% 100% 100%  Weight:  111.2 kg    Height:  5\' 6"  (1.676 m)      Intake/Output Summary (Last 24 hours) at 03/22/2020 1756 Last data filed at 03/21/2020 2049 Gross per 24 hour  Intake 500 ml  Output --  Net 500 ml   Filed Weights   03/22/20 1327  Weight: 111.2 kg    Data Reviewed: I have personally reviewed and interpreted daily labs, tele strips,  imagings as discussed above. I reviewed all nursing notes, pharmacy notes, vitals, pertinent old records I have discussed plan of care as described above with RN and patient/family.  CBC: Recent Labs  Lab 03/21/20 0725  WBC 7.6  HGB 11.0*  HCT 32.7*  MCV 80.9  PLT 353   Basic Metabolic Panel: Recent Labs  Lab 03/21/20 0725 03/22/20 0520  NA 141 142  K 3.4* 3.3*  CL 114* 115*  CO2 13* 12*  GLUCOSE 98 115*  BUN 22* 21*  CREATININE 1.47* 1.22*  CALCIUM 9.6 9.1    Studies: CT ANGIO HEAD W OR WO CONTRAST  Result Date: 03/22/2020 CLINICAL DATA:  Stroke/TIA.  Evaluate embolic source. EXAM: CT ANGIOGRAPHY HEAD AND NECK TECHNIQUE: Multidetector CT imaging of the head and neck was performed using the standard protocol during bolus administration of intravenous contrast. Multiplanar CT image reconstructions and MIPs were obtained to evaluate the vascular anatomy. Carotid stenosis measurements (when applicable) are obtained utilizing NASCET criteria, using the distal internal carotid diameter as the denominator. CONTRAST:  72mL OMNIPAQUE IOHEXOL 350 MG/ML SOLN COMPARISON:  MRI and MRA from March 21, 2020. FINDINGS: CT HEAD FINDINGS Brain: Multiple areas of edema and loss of gray differentiation in the cortical and subcortical left MCA and ACA territories, compatible with acute/subacute infarcts that were better characterized on recent MRI. No progressive mass effect. No midline shift. No evidence of acute mass occupying hemorrhagic transformation. No hydrocephalus. No mass lesion. No extra-axial fluid collection. Vascular: Calcific atherosclerosis. Skull: No acute fracture. Sinuses: Sinuses are clear. Orbits: Remote left medial orbital wall fracture. Review of the MIP images confirms the above findings CTA NECK FINDINGS Aortic arch: Great vessels are patent. Right carotid system: No evidence of dissection, stenosis (50% or greater) or occlusion. Atherosclerosis at the carotid bifurcation and  involving the proximal internal carotid artery without greater than 50% narrowing. Left carotid system: Mixed calcific and noncalcific atherosclerosis with mild ulceration at the carotid bifurcation and approximately 45% stenosis of the proximal internal carotid artery. No evidence of greater than 50% stenosis. Vertebral arteries: Codominant. Evaluation is limited by streak artifact from adjacent pooled contrast; however, there is likely severe stenosis of the left vertebral artery origin. Remainder of the left vertebral artery is patent. No evidence of significant stenosis on the right. Skeleton: No acute findings. Other neck: No mass suspicious adenopathy. Upper chest: No acute findings. Review of the MIP images confirms the above findings CTA HEAD FINDINGS Anterior circulation: Comparison to recent MRA, similar severe narrowing of proximal left M2 branches. Otherwise, no hemodynamically significant stenosis in the anterior circulation. Posterior circulation: No significant stenosis, proximal occlusion, aneurysm, or vascular malformation. Mild calcific atherosclerosis of the right intradural vertebral artery. Mild narrowing of the left PCA. Venous sinuses: As permitted by contrast timing, patent. Review of the MIP images confirms the above findings IMPRESSION: 1. Evolving multifocal acute/subacute infarcts in the left cortical and subcortical brain (MCA/ACA watershed territory), better characterized on recent MRI. No progressive mass  effect or hemorrhagic transformation. 2. In comparison to recent MRA, similar appearance of severe narrowing of proximal left M2 MCA branches. 3. Bilateral carotid bifurcation atherosclerosis with approximately 45% narrowing on the left. 4. Evaluation is limited by streak artifact from adjacent pooled contrast; however, there is likely severe stenosis of the left vertebral artery origin. Electronically Signed   By: Margaretha Sheffield MD   On: 03/22/2020 12:28   CT ANGIO NECK W OR WO  CONTRAST  Result Date: 03/22/2020 CLINICAL DATA:  Stroke/TIA.  Evaluate embolic source. EXAM: CT ANGIOGRAPHY HEAD AND NECK TECHNIQUE: Multidetector CT imaging of the head and neck was performed using the standard protocol during bolus administration of intravenous contrast. Multiplanar CT image reconstructions and MIPs were obtained to evaluate the vascular anatomy. Carotid stenosis measurements (when applicable) are obtained utilizing NASCET criteria, using the distal internal carotid diameter as the denominator. CONTRAST:  37mL OMNIPAQUE IOHEXOL 350 MG/ML SOLN COMPARISON:  MRI and MRA from March 21, 2020. FINDINGS: CT HEAD FINDINGS Brain: Multiple areas of edema and loss of gray differentiation in the cortical and subcortical left MCA and ACA territories, compatible with acute/subacute infarcts that were better characterized on recent MRI. No progressive mass effect. No midline shift. No evidence of acute mass occupying hemorrhagic transformation. No hydrocephalus. No mass lesion. No extra-axial fluid collection. Vascular: Calcific atherosclerosis. Skull: No acute fracture. Sinuses: Sinuses are clear. Orbits: Remote left medial orbital wall fracture. Review of the MIP images confirms the above findings CTA NECK FINDINGS Aortic arch: Great vessels are patent. Right carotid system: No evidence of dissection, stenosis (50% or greater) or occlusion. Atherosclerosis at the carotid bifurcation and involving the proximal internal carotid artery without greater than 50% narrowing. Left carotid system: Mixed calcific and noncalcific atherosclerosis with mild ulceration at the carotid bifurcation and approximately 45% stenosis of the proximal internal carotid artery. No evidence of greater than 50% stenosis. Vertebral arteries: Codominant. Evaluation is limited by streak artifact from adjacent pooled contrast; however, there is likely severe stenosis of the left vertebral artery origin. Remainder of the left vertebral  artery is patent. No evidence of significant stenosis on the right. Skeleton: No acute findings. Other neck: No mass suspicious adenopathy. Upper chest: No acute findings. Review of the MIP images confirms the above findings CTA HEAD FINDINGS Anterior circulation: Comparison to recent MRA, similar severe narrowing of proximal left M2 branches. Otherwise, no hemodynamically significant stenosis in the anterior circulation. Posterior circulation: No significant stenosis, proximal occlusion, aneurysm, or vascular malformation. Mild calcific atherosclerosis of the right intradural vertebral artery. Mild narrowing of the left PCA. Venous sinuses: As permitted by contrast timing, patent. Review of the MIP images confirms the above findings IMPRESSION: 1. Evolving multifocal acute/subacute infarcts in the left cortical and subcortical brain (MCA/ACA watershed territory), better characterized on recent MRI. No progressive mass effect or hemorrhagic transformation. 2. In comparison to recent MRA, similar appearance of severe narrowing of proximal left M2 MCA branches. 3. Bilateral carotid bifurcation atherosclerosis with approximately 45% narrowing on the left. 4. Evaluation is limited by streak artifact from adjacent pooled contrast; however, there is likely severe stenosis of the left vertebral artery origin. Electronically Signed   By: Margaretha Sheffield MD   On: 03/22/2020 12:28    Scheduled Meds: . aspirin EC  81 mg Oral Daily  . atorvastatin  80 mg Oral Daily  . brimonidine  1 drop Both Eyes TID  . clopidogrel  75 mg Oral Daily  . dorzolamide-timolol  1 drop Right Eye  BID  . enoxaparin (LOVENOX) injection  40 mg Subcutaneous Q24H  . ferrous sulfate  325 mg Oral Q breakfast  . fluticasone  1 spray Each Nare Daily  . gabapentin  100 mg Oral TID  . insulin aspart  0-9 Units Subcutaneous TID WC  . latanoprost  1 drop Both Eyes QHS  . montelukast  10 mg Oral QHS  . sodium bicarbonate  650 mg Oral BID    Continuous Infusions: PRN Meds: acetaminophen **OR** acetaminophen (TYLENOL) oral liquid 160 mg/5 mL **OR** acetaminophen, azelastine, hydrALAZINE, polyethylene glycol, senna-docusate, sodium chloride  Time spent: 35 minutes  Author: Berle Mull, MD [facility] 03/22/2020 5:56 PM  To reach On-call, see care teams to locate the attending and reach out via [email or web address]. Between 7PM-7AM, please contact night-coverage If you still have difficulty reaching the attending provider, please page the [facility] (Director on Call) for [facility] on amion for assistance.

## 2020-03-22 NOTE — TOC Initial Note (Signed)
Transition of Care [facility]) - Initial/Assessment Note    Patient Details  Name: Sharon Blankenship MRN: [id number] Date of Birth: 07/21/1965  Transition of Care [facility]) CM/SW Contact:    Verdell Carmine, RN Phone Number: 03/22/2020, 11:11 AM  Clinical Narrative:                 Patient arrived to [facility] with gait issues, found to have stroke, history of glaucoma, had new medication that she was taking for this, HBG A1C 11. For a bubble study. PT and Care Management consult. Will likely require DME and HH for gait training, mobility strengthening. No apparent deficits with language, strength equal. CM will follow for needs.   Expected Discharge Plan: [place] Barriers to Discharge: Continued Medical Work up   Patient Goals and CMS Choice        Expected Discharge Plan and Services Expected Discharge Plan: [place]       Living arrangements for the past 2 months: Apartment                                      Prior Living Arrangements/Services Living arrangements for the past 2 months: Apartment Lives with:: Spouse Patient language and need for interpreter reviewed:: Yes        Need for Family Participation in Patient Care: Yes (Comment) Care giver support system in place?: Yes (comment)   Criminal Activity/Legal Involvement Pertinent to Current Situation/Hospitalization: No - Comment as needed  Activities of Daily Living      Permission Sought/Granted                  Emotional Assessment       Orientation: : Oriented to Place,Oriented to  Time,Oriented to Situation,Oriented to Self Alcohol / Substance Use: Not Applicable Psych Involvement: No (comment)  Admission diagnosis:  CVA (cerebral vascular accident) [facility]) [I63.9] Patient Active Problem List   Diagnosis Date Noted  . CVA (cerebral vascular accident) ([place]) 03/21/2020   PCP:  Fanny Bien, MD Pharmacy:   [facility] [address] [place] [address] Phone: [phone] Fax: [phone]     Social Determinants of Health (SDOH) Interventions    Readmission Risk Interventions No flowsheet data found.

## 2020-03-22 NOTE — Progress Notes (Addendum)
STROKE TEAM PROGRESS NOTE   HISTORY OF PRESENT ILLNESS (per record) Tatiyanna Lashley is a 54 y.o. female with a medical history significant for obesity, glaucoma, hypertension, and type 2 diabetes who presented to [facility] ED with complaints of approximately 2 days of intermittent numbness and weakness of her right upper extremity (hand weaker than right upper arm). Per her spouse, he has noticed her "stumbling around the apartment" and increased confusion and slurred speech he first noticed last night 12/16. Ms. Blackston states she was started on acetazolamide for her glaucoma about 3 months ago and noted double vision, confusion, dizziness, and incoordination with ambulation since starting this medication. She states when she gets up to use the restroom at night she has to "hold onto the walls" and that the medication has made her experience generalized weakness.  LKW: Approximately 2 days ago, exact time unknown (symptoms intermittent) tpa given?: no, outside of time window  INTERVAL HISTORY Her family is not at bedside. Patient is alert, laying in bed, states she feels better. We discussed possible causes of her stroke, patient reports dehydration prior to event due to multiple diuretics.     OBJECTIVE Vitals:   03/22/20 0200 03/22/20 0230 03/22/20 0300 03/22/20 0330  BP: 133/65 129/62 140/66 140/67  Pulse: 98 98 (!) 101 98  Resp: 13 14 16 14   Temp:      TempSrc:      SpO2: 99% 99% 98% 100%    CBC:  Recent Labs  Lab 03/21/20 0725  WBC 7.6  HGB 11.0*  HCT 32.7*  MCV 80.9  PLT 128    Basic Metabolic Panel:  Recent Labs  Lab 03/21/20 0725  NA 141  K 3.4*  CL 114*  CO2 13*  GLUCOSE 98  BUN 22*  CREATININE 1.47*  CALCIUM 9.6    Lipid Panel:     Component Value Date/Time   CHOL 222 (H) 03/21/2020 1157   TRIG 144 03/21/2020 1157   HDL 34 (L) 03/21/2020 1157   CHOLHDL 6.5 03/21/2020 1157   VLDL 29 03/21/2020 1157   LDLCALC 159 (H) 03/21/2020 1157   HgbA1c:  Lab Results   Component Value Date   HGBA1C 6.6 (H) 03/21/2020   Urine Drug Screen: No results found for: LABOPIA, COCAINSCRNUR, LABBENZ, AMPHETMU, THCU, LABBARB  Alcohol Level No results found for: [facility]  IMAGING  DG Chest 2 View 03/21/2020 IMPRESSION:  Patchy retrocardiac opacities, atelectasis versus infiltrate. Mild hypoinflation.   CT Head Wo Contrast 03/21/2020 IMPRESSION:  1. Small area of patchy hypodensity in the left frontal lobe with loss of the normal gray-white matter differentiation, concerning for acute to subacute infarct given clinical history.  2. Age indeterminate small white matter infarct in the left occipital lobe.   MR BRAIN WO CONTRAST MR ANGIO HEAD WO CONTRAST MR ANGIO NECK W WO CONTRAST 03/21/2020 IMPRESSION:  1. Numerous acute infarctions affecting the cortical and subcortical brain extending from front to back on the left at the junction of the ACA and MCA territories consistent with watershed distribution infarctions. No evidence of mass effect or hemorrhage.  2. Severe narrowing of the proximal M2 branch vessels on the left, which could be due to plaque or nonocclusive embolus.  3. Intracranial MR angiography of the neck vessels shows only minimal atherosclerotic change at the carotid bifurcations without flow limiting stenosis or pronounced irregularity.  4. Mild atherosclerotic narrowing of the proximal M2 branches on the right. Atherosclerotic narrowing of the right superior cerebellar artery. Mild atherosclerotic irregularity of  the more distal PCA branches right more than left.   Transthoracic Echocardiogram w Bubbles 00/00/2021 Pending  ECG - ST rate 120 BPM. (See cardiology reading for complete details)  PHYSICAL EXAM Blood pressure 140/67, pulse 98, temperature 98.6 F (37 C), temperature source Oral, resp. rate 14, last menstrual period 04/24/2014, SpO2 100 %.   Patient is an obese African-American female laying in bed in no acute distress, she is  awake and alert, oriented to person place time and situation, speech is clear, no aphasia or dysarthria, naming repetition intact, she is a good historian, pupils equally round and reactive to light, sensation intact in the face, face is symmetric, tongue is midline without fasciculations, patient can hold all extremities antigravity without drift, however right upper extremity appears slightly weaker on strength testing, tone is normal, intact to sensation in all extremities, coordination and gait deferred.   ASSESSMENT/PLAN Ms. Jamilyn Pigeon is a 54 y.o. female with history of obesity, glaucoma, hypertension, and type 2 diabetes who presented to [facility] ED with complaints of approximately 2 days of intermittent numbness and weakness of her right upper extremity (hand weaker than right upper arm) , gait disturbance, increased confusion and slurred speech. Ms. Hellmann states she was started on acetazolamide for her glaucoma about 3 months ago and noted double vision, confusion, dizziness, and incoordination with ambulation since starting this medication. She did not receive IV t-PA due to late presentation (>4.5 hours from time of onset).  Stroke: multiple Lt MCA territory embolic stroke - likely due to atheroembolic phenomena with symptomatic intra-cerebral atherosclerosis in the setting of hypotension on multiple diuretics  Code Stroke CT Head - not ordered  CT head - Small area of patchy hypodensity in the left frontal lobe with loss of the normal gray-white matter differentiation, concerning for acute to subacute infarct given clinical history. Age indeterminate small white matter infarct in the left occipital lobe.   MRI head - Numerous acute infarctions affecting the cortical and subcortical brain extending from front to back on the left at the junction of the ACA and MCA territories consistent with watershed distribution infarctions. No evidence of mass effect or hemorrhage  MRA H&N - Severe  narrowing of the proximal M2 branch vessels on the left, which could be due to plaque or nonocclusive embolus.   CTA H&N -we will order to further evaluate M2 branches on the left.  Hopefully patient's creatinine will improve.  Pending results of CTA of the head and neck may need further evaluation for embolic versus atherosclerotic etiology of left to narrowing. If appears embolic may need TEE/Loop and Hypercoag testing  CT Perfusion - not ordered  Carotid Doppler - MRA neck ordered - carotid dopplers not indicated. Pending CTA H&N.   2D Echo w bubbles - pending  Sars Corona Virus 2  - negative  LDL - 159  HgbA1c - 6.6  UDS - not ordered  VTE prophylaxis - Lovenox Diet  Diet Order            Diet NPO time specified  Diet effective now                 No antithrombotic prior to admission, now on aspirin 81 mg daily and clopidogrel 75 mg daily  Patient counseled to be compliant with her antithrombotic medications  Ongoing aggressive stroke risk factor management  Therapy recommendations:  pending  Disposition:  Pending  Hypertension  Home BP meds: HCTZ ; Cozaar  Current BP meds: Hydralazine prn  Stable . Permissive hypertension (OK if < 220/120) but gradually normalize in 5-7 days  . Long-term BP goal normotensive  Hyperlipidemia  Home Lipid lowering medication: none   LDL 159, goal < 70  Current lipid lowering medication: none / NPO   Continue statin at discharge  Diabetes  Home diabetic meds: metformin ; Tresiba  Current diabetic meds: SSI   HgbA1c 6.6, goal < 7.0 Recent Labs    03/21/20 2254  GLUCAP 92    Other Stroke Risk Factors  Obesity, recommend weight loss, diet and exercise as appropriate   Family hx stroke - not on file  Consider sleep evaluation outpatient for obstructive sleep apnea, refer to Larey Seat at [facility] for evaluation.  Other Active Problems, Findings and Recommendations  Code status -  Full code   Anemia - Hgb - 11.o  Hypokalemia - potassium - 3.4 - supplement when swallowing has been cleared (shortage of IV KCL)  AKI vs CKD - creatinine - 1.47  Hospital day # 1  Personally examined patient and images, and have participated in and made any corrections needed to history, physical, neuro exam,assessment and plan as stated above.  I have personally obtained the history, evaluated lab date, reviewed imaging studies and agree with radiology interpretations.    Sarina Ill, MD Stroke Neurology  I spent 35 minutes of face-to-face and non-face-to-face time with patient. This included prechart review, lab review, study review, order entry, electronic health record documentation, patient education on the different diagnostic and therapeutic options, counseling and coordination of care, risks and benefits of management, compliance, or risk factor reduction  To contact Stroke Continuity provider, please refer to [email or web address] After hours, contact General Neurology

## 2020-03-23 ENCOUNTER — Other Ambulatory Visit: Payer: Self-pay | Admitting: Internal Medicine

## 2020-03-23 ENCOUNTER — Inpatient Hospital Stay (HOSPITAL_COMMUNITY): Payer: BLUE CROSS/BLUE SHIELD

## 2020-03-23 DIAGNOSIS — I639 Cerebral infarction, unspecified: Secondary | ICD-10-CM

## 2020-03-23 DIAGNOSIS — I6389 Other cerebral infarction: Secondary | ICD-10-CM

## 2020-03-23 DIAGNOSIS — I63512 Cerebral infarction due to unspecified occlusion or stenosis of left middle cerebral artery: Secondary | ICD-10-CM

## 2020-03-23 LAB — BASIC METABOLIC PANEL
Anion gap: 12 (ref 5–15)
BUN: 17 mg/dL (ref 6–20)
CO2: 17 mmol/L — ABNORMAL LOW (ref 22–32)
Calcium: 9.2 mg/dL (ref 8.9–10.3)
Chloride: 114 mmol/L — ABNORMAL HIGH (ref 98–111)
Creatinine, Ser: 1.12 mg/dL — ABNORMAL HIGH (ref 0.44–1.00)
GFR, Estimated: 59 mL/min — ABNORMAL LOW (ref >60)
Glucose, Bld: 127 mg/dL — ABNORMAL HIGH (ref 70–99)
Potassium: 3.2 mmol/L — ABNORMAL LOW (ref 3.5–5.1)
Sodium: 143 mmol/L (ref 135–145)

## 2020-03-23 LAB — ECHOCARDIOGRAM COMPLETE BUBBLE STUDY
Area-P 1/2: 5.54 cm2
Calc EF: 61.5 %
S' Lateral: 2.3 cm
Single Plane A2C EF: 65.1 %
Single Plane A4C EF: 58 %

## 2020-03-23 LAB — GLUCOSE, CAPILLARY
Glucose-Capillary: 118 mg/dL — ABNORMAL HIGH (ref 70–99)
Glucose-Capillary: 225 mg/dL — ABNORMAL HIGH (ref 70–99)
Glucose-Capillary: 212 mg/dL — ABNORMAL HIGH (ref 70–99)

## 2020-03-23 LAB — CBC
HCT: 30 % — ABNORMAL LOW (ref 36.0–46.0)
Hemoglobin: 9.8 g/dL — ABNORMAL LOW (ref 12.0–15.0)
MCH: 26.3 pg (ref 26.0–34.0)
MCHC: 32.7 g/dL (ref 30.0–36.0)
MCV: 80.6 fL (ref 80.0–100.0)
Platelets: 255 10*3/uL (ref 150–400)
RBC: 3.72 MIL/uL — ABNORMAL LOW (ref 3.87–5.11)
RDW: 14.8 % (ref 11.5–15.5)
WBC: 5.4 10*3/uL (ref 4.0–10.5)
nRBC: 0 % (ref 0.0–0.2)

## 2020-03-23 LAB — ANTITHROMBIN III: AntiThromb III Func: 117 % (ref 75–120)

## 2020-03-23 LAB — MAGNESIUM: Magnesium: 1.5 mg/dL — ABNORMAL LOW (ref 1.7–2.4)

## 2020-03-23 MED ORDER — CLOPIDOGREL BISULFATE 75 MG PO TABS: 75.0000 mg | ORAL_TABLET | Freq: Every day | ORAL | 0 refills | Status: AC

## 2020-03-23 MED ORDER — MAGNESIUM SULFATE 2 GM/50ML IV SOLN
2.0000 g | Freq: Once | INTRAVENOUS | Status: AC
  Administered 2020-03-23: 2 g via INTRAVENOUS
  Filled 2020-03-23: qty 50

## 2020-03-23 MED ORDER — POTASSIUM CHLORIDE CRYS ER 20 MEQ PO TBCR
40.0000 meq | EXTENDED_RELEASE_TABLET | ORAL | Status: AC
  Administered 2020-03-23 (×2): 40 meq via ORAL
  Filled 2020-03-23 (×2): qty 2

## 2020-03-23 MED ORDER — ASPIRIN 81 MG PO TBEC: 81.0000 mg | DELAYED_RELEASE_TABLET | Freq: Every day | ORAL | 11 refills | Status: DC

## 2020-03-23 NOTE — Discharge Summary (Signed)
[facility] Discharge Summary   Patient: Sharon Blankenship MGQ:[id number]  PCP: Fanny Bien, MD  Date of admission: 03/21/2020   Date of discharge: 03/23/2020      Discharge Diagnoses:  Principal diagnosis CVA  Active Problems:   CVA (cerebral vascular accident) [facility]) glaucoma   Admitted From: home Disposition:  Home   Recommendations for Outpatient Follow-up:  1. PCP: please follow up with PCP in 1 week 2. Follow up LABS/TEST:  none   Follow-up Information    Fanny Bien, MD. Schedule an appointment as soon as possible for a visit in 1 week(s).   Specialty: Family Medicine Contact information: [address] [place] [address] [phone]        Marlowe Sax, MD Follow up.   Specialty: Ophthalmology Why: discuss initiation of acetazolamide Contact information: [place] [address] [phone]        [facility] Follow up in 2 month(s).   Specialty: Neurology Contact information: [address] [place], [facility] Follow up.   Specialty: Home Health Services Why: The home health agency will contact you for the first home visit. Contact information: [address] [place] [address] [phone]              Discharge Instructions    Ambulatory referral to Neurology   Complete by: As directed    An appointment is requested in approximately: 8 weeks   Diet - low sodium heart healthy   Complete by: As directed    Increase activity slowly   Complete by: As directed      Diet recommendation: Cardiac diet  Activity: The patient is advised to gradually reintroduce usual activities, as tolerated  Discharge Condition: stable  Code Status: Full code   History of present illness: As per the H and P dictated on admission, "Sharon Blankenship is a 54 y.o. female with medical history significant  of HTN, IDDM, chronic closed angle glaucoma, morbid obesity, presented with new onset right-sided weakness. Her symptoms started 2 days ago, initially was intermittent then became constant. Symptoms involve right-sided weakness mainly on the right arm, associated with numbness. Patient was recently started on acetazolamide for her worsening glaucoma, and she has been having intermittent weakness dizziness and confusion, which she attributed to the side effect of medication. Last week she noticed her blood pressure was around 120s, which is significantly lower than her baseline systolic 419F. She also noticed her glucose level has been on the lower side lately, in the morning her fingerstick reading lower 100s, and her most recent A1c was 6 compared to around 8 before. ED Course: MRI positive for left ACA and MCA attribution watershed stroke. Labs showed no anion gap severe metabolic acidosis."  Hospital Course:  Summary of her active problems in the hospital is as following. 1.  Multiple left MCA territory embolic stroke Likely combination of symptomatic MCA territory stenosis and hypertension on diuretics CTA small patchy hypodensity on left frontal lobe MRI brain acute infarction in ACA MCA watershed territory. MRA head severe narrowing of proximal M2 CTA head and neck  CTA H&N - Similar as MRA, favor large vessel atherosclerosis rather than embolic phenomenon.  Echocardiogram shows normal EF and no evidence of emboli LDL 159 on Lipitor 80 mg. Hemoglobin A1c 6.6. Passed swallowing evaluation currently on diet. No medication prior to admission currently on aspirin and  Plavix  2.  Essential hypertension Blood pressure mildly elevated right now. Permissive hypertension. Home medication HCTZ and Cozaar on hold. Maintain BP> 120systolic  Recently was started on acetazolamide for glaucoma for 2 months which corrected her blood pressure to almost normal, unsure if that contributed to this  watershed infarct.  3.  Type 2 diabetes mellitus uncontrolled with hyperglycemia with hyperlipidemia Recently sugar control significantly improved. Continue home regimen on discharge.  4.  Glaucoma Continue eyedrops. Recently started on acetazolamide. Likely cause of patient's metabolic acidosis. D/w ophthalmology at [facility]. Currently holding diamox. Pt will call primary ophthalmology for further recommendation.  Monitor.  5.  Hypokalemia, metabolic acidosis, acute kidney injury Likely secondary to acetazolamide. Currently on hold. On sodium bicarb. We will monitor.  6.  Obesity Placing the patient at high risk for poor outcome. Patient is reporting some headache but does not have any daytime sleepiness or fatigue or any other sleep apnea symptoms. Still given BMI patient will benefit from outpatient sleep study to minimize her stroke risk. Body mass index is 39.57 kg/m.   Patient was seen by physical therapy, who recommended Home Health,  On the day of the discharge the patient's vitals were stable, and no other acute medical condition were reported by patient. The patient was felt safe to be discharge at Home with Home health.  Consultants: Neurology  Procedures: Echocardiogram   DISCHARGE MEDICATION: Allergies as of 03/23/2020      Reactions   Aloe Shortness Of Breath, Rash   SOB, Swelling, Rash, Itching   Penicillins Hives, Itching, Swelling   Did it involve swelling of the face/tongue/throat, SOB, or low BP? Y Did it involve sudden or severe rash/hives, skin peeling, or any reaction on the inside of your mouth or nose? N Did you need to seek medical attention at a hospital or doctor's office? Y When did it last happen? Several Years Ago If all above answers are NO, may proceed with cephalosporin use.      Medication List    STOP taking these medications   acetaZOLAMIDE 250 MG tablet Commonly known as: DIAMOX   hydrochlorothiazide 25 MG  tablet Commonly known as: HYDRODIURIL   losartan 100 MG tablet Commonly known as: COZAAR     TAKE these medications   aspirin 81 MG EC tablet Take 1 tablet (81 mg total) by mouth daily. Swallow whole. Start taking on: March 24, 2020   atorvastatin 20 MG tablet Commonly known as: LIPITOR Take 20 mg by mouth daily.   azelastine 0.1 % nasal spray Commonly known as: ASTELIN Place 1 spray into both nostrils daily as needed for allergies.   brimonidine 0.2 % ophthalmic solution Commonly known as: ALPHAGAN Place 1 drop into both eyes 3 (three) times daily.   clopidogrel 75 MG tablet Commonly known as: PLAVIX Take 1 tablet (75 mg total) by mouth daily. Start taking on: March 24, 2020   dorzolamide-timolol 22.3-6.8 MG/ML ophthalmic solution Commonly known as: COSOPT Place 1 drop into the right eye 2 (two) times daily.   ferrous sulfate 325 (65 FE) MG tablet Take 325 mg by mouth daily with breakfast.   Fiasp FlexTouch 100 UNIT/ML FlexTouch Pen Generic drug: insulin aspart Inject 18 Units into the skin daily as needed (blood sugar).   gabapentin 100 MG capsule Commonly known as: NEURONTIN Take 100 mg by mouth 3 (three) times daily as needed (nerve pain).   latanoprost 0.005 % ophthalmic solution Commonly known as: XALATAN Place 1 drop into both eyes at  bedtime.   meloxicam 15 MG tablet Commonly known as: MOBIC Take 15 mg by mouth daily as needed for pain.   metFORMIN 1000 MG tablet Commonly known as: GLUCOPHAGE Take 1,000 mg by mouth 2 (two) times daily with a meal.   methocarbamol 500 MG tablet Commonly known as: ROBAXIN Take 500 mg by mouth 3 (three) times daily.   Ozempic (1 MG/DOSE) 4 MG/3ML Sopn Generic drug: Semaglutide (1 MG/DOSE) Inject 1 mg into the skin once a week. On Wednesday   polyethylene glycol 17 g packet Commonly known as: MIRALAX / GLYCOLAX Take 17 g by mouth daily as needed for mild constipation or moderate constipation.   Tyler Aas  FlexTouch 200 UNIT/ML FlexTouch Pen Generic drug: insulin degludec Inject 80 Units into the skin daily.       Discharge Exam: Filed Weights   03/22/20 1327  Weight: 111.2 kg   Vitals:   03/23/20 1337 03/23/20 1500  BP: (!) 113/99 (!) 153/86  Pulse: 98 95  Resp: 18 17  Temp: 98 F (36.7 C) 98.1 F (36.7 C)  SpO2: 97% 100%   General: Appear in no distress, no Rash; Oral Mucosa Clear, moist. no Abnormal Neck Mass Or lumps, Conjunctiva normal  Cardiovascular: S1 and S2 Present, no Murmur Respiratory: good respiratory effort, Bilateral Air entry present and CTA, no Crackles, no wheezes Abdomen: Bowel Sound present, Soft and no tenderness Extremities: no Pedal edema Neurology: alert and oriented to time, place, and person affect appropriate. no new focal deficit, right sided weakness  The results of significant diagnostics from this hospitalization (including imaging, microbiology, ancillary and laboratory) are listed below for reference.    Significant Diagnostic Studies: CT ANGIO HEAD W OR WO CONTRAST  Result Date: 03/22/2020 CLINICAL DATA:  Stroke/TIA.  Evaluate embolic source. EXAM: CT ANGIOGRAPHY HEAD AND NECK TECHNIQUE: Multidetector CT imaging of the head and neck was performed using the standard protocol during bolus administration of intravenous contrast. Multiplanar CT image reconstructions and MIPs were obtained to evaluate the vascular anatomy. Carotid stenosis measurements (when applicable) are obtained utilizing NASCET criteria, using the distal internal carotid diameter as the denominator. CONTRAST:  42mL OMNIPAQUE IOHEXOL 350 MG/ML SOLN COMPARISON:  MRI and MRA from March 21, 2020. FINDINGS: CT HEAD FINDINGS Brain: Multiple areas of edema and loss of gray differentiation in the cortical and subcortical left MCA and ACA territories, compatible with acute/subacute infarcts that were better characterized on recent MRI. No progressive mass effect. No midline shift. No  evidence of acute mass occupying hemorrhagic transformation. No hydrocephalus. No mass lesion. No extra-axial fluid collection. Vascular: Calcific atherosclerosis. Skull: No acute fracture. Sinuses: Sinuses are clear. Orbits: Remote left medial orbital wall fracture. Review of the MIP images confirms the above findings CTA NECK FINDINGS Aortic arch: Great vessels are patent. Right carotid system: No evidence of dissection, stenosis (50% or greater) or occlusion. Atherosclerosis at the carotid bifurcation and involving the proximal internal carotid artery without greater than 50% narrowing. Left carotid system: Mixed calcific and noncalcific atherosclerosis with mild ulceration at the carotid bifurcation and approximately 45% stenosis of the proximal internal carotid artery. No evidence of greater than 50% stenosis. Vertebral arteries: Codominant. Evaluation is limited by streak artifact from adjacent pooled contrast; however, there is likely severe stenosis of the left vertebral artery origin. Remainder of the left vertebral artery is patent. No evidence of significant stenosis on the right. Skeleton: No acute findings. Other neck: No mass suspicious adenopathy. Upper chest: No acute findings. Review of the MIP  images confirms the above findings CTA HEAD FINDINGS Anterior circulation: Comparison to recent MRA, similar severe narrowing of proximal left M2 branches. Otherwise, no hemodynamically significant stenosis in the anterior circulation. Posterior circulation: No significant stenosis, proximal occlusion, aneurysm, or vascular malformation. Mild calcific atherosclerosis of the right intradural vertebral artery. Mild narrowing of the left PCA. Venous sinuses: As permitted by contrast timing, patent. Review of the MIP images confirms the above findings IMPRESSION: 1. Evolving multifocal acute/subacute infarcts in the left cortical and subcortical brain (MCA/ACA watershed territory), better characterized on recent  MRI. No progressive mass effect or hemorrhagic transformation. 2. In comparison to recent MRA, similar appearance of severe narrowing of proximal left M2 MCA branches. 3. Bilateral carotid bifurcation atherosclerosis with approximately 45% narrowing on the left. 4. Evaluation is limited by streak artifact from adjacent pooled contrast; however, there is likely severe stenosis of the left vertebral artery origin. Electronically Signed   By: Margaretha Sheffield MD   On: 03/22/2020 12:28   DG Chest 2 View  Result Date: 03/21/2020 CLINICAL DATA:  shortness of breath EXAM: CHEST - 2 VIEW COMPARISON:  None. FINDINGS: Mild hypoinflation. Patchy retrocardiac opacities. No pneumothorax or pleural effusion. Cardiomediastinal silhouette is within normal limits. Multilevel spondylosis. IMPRESSION: Patchy retrocardiac opacities, atelectasis versus infiltrate. Mild hypoinflation. Electronically Signed   By: Primitivo Gauze M.D.   On: 03/21/2020 08:07   CT Head Wo Contrast  Result Date: 03/21/2020 CLINICAL DATA:  Right greater than left-sided weakness for the past 3 days. EXAM: CT HEAD WITHOUT CONTRAST TECHNIQUE: Contiguous axial images were obtained from the base of the skull through the vertex without intravenous contrast. COMPARISON:  None. FINDINGS: Brain: Small area of patchy hypodensity in the left frontal lobe with loss of the normal gray-white matter differentiation (series 3, images 8-11). Additional small patchy white matter hypodensity in the left occipital lobe. No evidence of hemorrhage, hydrocephalus, extra-axial collection or mass lesion/mass effect. Vascular: Calcified atherosclerosis at the skullbase. No hyperdense vessel. Skull: Normal. Negative for fracture or focal lesion. Sinuses/Orbits: No acute finding. Old left lamina papyracea fracture with intraorbital fat herniation medially. Other: None. IMPRESSION: 1. Small area of patchy hypodensity in the left frontal lobe with loss of the normal  gray-white matter differentiation, concerning for acute to subacute infarct given clinical history. 2. Age indeterminate small white matter infarct in the left occipital lobe. Electronically Signed   By: Titus Dubin M.D.   On: 03/21/2020 13:02   CT ANGIO NECK W OR WO CONTRAST  Result Date: 03/22/2020 CLINICAL DATA:  Stroke/TIA.  Evaluate embolic source. EXAM: CT ANGIOGRAPHY HEAD AND NECK TECHNIQUE: Multidetector CT imaging of the head and neck was performed using the standard protocol during bolus administration of intravenous contrast. Multiplanar CT image reconstructions and MIPs were obtained to evaluate the vascular anatomy. Carotid stenosis measurements (when applicable) are obtained utilizing NASCET criteria, using the distal internal carotid diameter as the denominator. CONTRAST:  35mL OMNIPAQUE IOHEXOL 350 MG/ML SOLN COMPARISON:  MRI and MRA from March 21, 2020. FINDINGS: CT HEAD FINDINGS Brain: Multiple areas of edema and loss of gray differentiation in the cortical and subcortical left MCA and ACA territories, compatible with acute/subacute infarcts that were better characterized on recent MRI. No progressive mass effect. No midline shift. No evidence of acute mass occupying hemorrhagic transformation. No hydrocephalus. No mass lesion. No extra-axial fluid collection. Vascular: Calcific atherosclerosis. Skull: No acute fracture. Sinuses: Sinuses are clear. Orbits: Remote left medial orbital wall fracture. Review of the MIP images confirms the  above findings CTA NECK FINDINGS Aortic arch: Great vessels are patent. Right carotid system: No evidence of dissection, stenosis (50% or greater) or occlusion. Atherosclerosis at the carotid bifurcation and involving the proximal internal carotid artery without greater than 50% narrowing. Left carotid system: Mixed calcific and noncalcific atherosclerosis with mild ulceration at the carotid bifurcation and approximately 45% stenosis of the proximal  internal carotid artery. No evidence of greater than 50% stenosis. Vertebral arteries: Codominant. Evaluation is limited by streak artifact from adjacent pooled contrast; however, there is likely severe stenosis of the left vertebral artery origin. Remainder of the left vertebral artery is patent. No evidence of significant stenosis on the right. Skeleton: No acute findings. Other neck: No mass suspicious adenopathy. Upper chest: No acute findings. Review of the MIP images confirms the above findings CTA HEAD FINDINGS Anterior circulation: Comparison to recent MRA, similar severe narrowing of proximal left M2 branches. Otherwise, no hemodynamically significant stenosis in the anterior circulation. Posterior circulation: No significant stenosis, proximal occlusion, aneurysm, or vascular malformation. Mild calcific atherosclerosis of the right intradural vertebral artery. Mild narrowing of the left PCA. Venous sinuses: As permitted by contrast timing, patent. Review of the MIP images confirms the above findings IMPRESSION: 1. Evolving multifocal acute/subacute infarcts in the left cortical and subcortical brain (MCA/ACA watershed territory), better characterized on recent MRI. No progressive mass effect or hemorrhagic transformation. 2. In comparison to recent MRA, similar appearance of severe narrowing of proximal left M2 MCA branches. 3. Bilateral carotid bifurcation atherosclerosis with approximately 45% narrowing on the left. 4. Evaluation is limited by streak artifact from adjacent pooled contrast; however, there is likely severe stenosis of the left vertebral artery origin. Electronically Signed   By: Margaretha Sheffield MD   On: 03/22/2020 12:28   MR ANGIO HEAD WO CONTRAST  Result Date: 03/21/2020 CLINICAL DATA:  Right worse than left weakness. Left frontal stroke question by CT. EXAM: MRI HEAD WITHOUT AND WITH CONTRAST MRA HEAD WITHOUT CONTRAST MRA NECK WITHOUT AND WITH CONTRAST TECHNIQUE: Multiplanar,  multiecho pulse sequences of the brain and surrounding structures were obtained without and with intravenous contrast. Angiographic images of the Circle of Willis were obtained using MRA technique without intravenous contrast. Angiographic images of the neck were obtained using MRA technique without and with intravenous contrast. Carotid stenosis measurements (when applicable) are obtained utilizing NASCET criteria, using the distal internal carotid diameter as the denominator. CONTRAST:  34mL GADAVIST GADOBUTROL 1 MMOL/ML IV SOLN COMPARISON:  Head CT same day FINDINGS: MRI HEAD FINDINGS Brain: Diffusion imaging shows numerous acute infarctions affecting the cortical and subcortical brain extending from front to back on the left consistent with watershed distribution infarctions. No evidence of mass effect or detectable hemorrhage. No acute infarction in the right hemisphere, brainstem or cerebellum. Mild chronic small-vessel change affects the hemispheric white matter. No hydrocephalus or extra-axial collection. Vascular: Major vessels at the base of the brain show flow. Skull and upper cervical spine: Negative Sinuses/Orbits: Clear/normal Other: None MRA HEAD FINDINGS Both internal carotid arteries are patent through the skull base and siphon regions. On the right, the anterior and middle cerebral vessels are patent. Question mild stenosis of the proximal M2 branches on the right. On the left, the anterior cerebral artery is widely patent. The left M1 segment is widely patent. There is severe narrowing of the proximal M2 branches on the left. This could be due to plaque or nonocclusive embolus. Both vertebral arteries widely patent to the basilar. No basilar stenosis. Stenotic change of  the right superior cerebellar artery. Left superior cerebellar artery is normal. Both posterior cerebral arteries are patent. Some atherosclerotic irregularity of more distal branch vessels, right more than left. MRA NECK FINDINGS  Branching pattern from the arch is normal. Common origin of the innominate artery and left common carotid artery. Both common carotid arteries are widely patent to the bifurcation. Minimal atherosclerotic change at both carotid bifurcations but no stenosis. Both cervical internal carotid arteries are normal. Both vertebral arteries are widely patent through the cervical region to the basilar. IMPRESSION: 1. Numerous acute infarctions affecting the cortical and subcortical brain extending from front to back on the left at the junction of the ACA and MCA territories consistent with watershed distribution infarctions. No evidence of mass effect or hemorrhage. 2. Severe narrowing of the proximal M2 branch vessels on the left, which could be due to plaque or nonocclusive embolus. 3. Intracranial MR angiography of the neck vessels shows only minimal atherosclerotic change at the carotid bifurcations without flow limiting stenosis or pronounced irregularity. 4. Mild atherosclerotic narrowing of the proximal M2 branches on the right. Atherosclerotic narrowing of the right superior cerebellar artery. Mild atherosclerotic irregularity of the more distal PCA branches right more than left. Electronically Signed   By: Nelson Chimes M.D.   On: 03/21/2020 17:42   MR ANGIO NECK W WO CONTRAST  Result Date: 03/21/2020 CLINICAL DATA:  Right worse than left weakness. Left frontal stroke question by CT. EXAM: MRI HEAD WITHOUT AND WITH CONTRAST MRA HEAD WITHOUT CONTRAST MRA NECK WITHOUT AND WITH CONTRAST TECHNIQUE: Multiplanar, multiecho pulse sequences of the brain and surrounding structures were obtained without and with intravenous contrast. Angiographic images of the Circle of Willis were obtained using MRA technique without intravenous contrast. Angiographic images of the neck were obtained using MRA technique without and with intravenous contrast. Carotid stenosis measurements (when applicable) are obtained utilizing NASCET  criteria, using the distal internal carotid diameter as the denominator. CONTRAST:  40mL GADAVIST GADOBUTROL 1 MMOL/ML IV SOLN COMPARISON:  Head CT same day FINDINGS: MRI HEAD FINDINGS Brain: Diffusion imaging shows numerous acute infarctions affecting the cortical and subcortical brain extending from front to back on the left consistent with watershed distribution infarctions. No evidence of mass effect or detectable hemorrhage. No acute infarction in the right hemisphere, brainstem or cerebellum. Mild chronic small-vessel change affects the hemispheric white matter. No hydrocephalus or extra-axial collection. Vascular: Major vessels at the base of the brain show flow. Skull and upper cervical spine: Negative Sinuses/Orbits: Clear/normal Other: None MRA HEAD FINDINGS Both internal carotid arteries are patent through the skull base and siphon regions. On the right, the anterior and middle cerebral vessels are patent. Question mild stenosis of the proximal M2 branches on the right. On the left, the anterior cerebral artery is widely patent. The left M1 segment is widely patent. There is severe narrowing of the proximal M2 branches on the left. This could be due to plaque or nonocclusive embolus. Both vertebral arteries widely patent to the basilar. No basilar stenosis. Stenotic change of the right superior cerebellar artery. Left superior cerebellar artery is normal. Both posterior cerebral arteries are patent. Some atherosclerotic irregularity of more distal branch vessels, right more than left. MRA NECK FINDINGS Branching pattern from the arch is normal. Common origin of the innominate artery and left common carotid artery. Both common carotid arteries are widely patent to the bifurcation. Minimal atherosclerotic change at both carotid bifurcations but no stenosis. Both cervical internal carotid arteries are normal. Both  vertebral arteries are widely patent through the cervical region to the basilar. IMPRESSION: 1.  Numerous acute infarctions affecting the cortical and subcortical brain extending from front to back on the left at the junction of the ACA and MCA territories consistent with watershed distribution infarctions. No evidence of mass effect or hemorrhage. 2. Severe narrowing of the proximal M2 branch vessels on the left, which could be due to plaque or nonocclusive embolus. 3. Intracranial MR angiography of the neck vessels shows only minimal atherosclerotic change at the carotid bifurcations without flow limiting stenosis or pronounced irregularity. 4. Mild atherosclerotic narrowing of the proximal M2 branches on the right. Atherosclerotic narrowing of the right superior cerebellar artery. Mild atherosclerotic irregularity of the more distal PCA branches right more than left. Electronically Signed   By: Nelson Chimes M.D.   On: 03/21/2020 17:42   MR BRAIN WO CONTRAST  Result Date: 03/21/2020 CLINICAL DATA:  Right worse than left weakness. Left frontal stroke question by CT. EXAM: MRI HEAD WITHOUT AND WITH CONTRAST MRA HEAD WITHOUT CONTRAST MRA NECK WITHOUT AND WITH CONTRAST TECHNIQUE: Multiplanar, multiecho pulse sequences of the brain and surrounding structures were obtained without and with intravenous contrast. Angiographic images of the Circle of Willis were obtained using MRA technique without intravenous contrast. Angiographic images of the neck were obtained using MRA technique without and with intravenous contrast. Carotid stenosis measurements (when applicable) are obtained utilizing NASCET criteria, using the distal internal carotid diameter as the denominator. CONTRAST:  77mL GADAVIST GADOBUTROL 1 MMOL/ML IV SOLN COMPARISON:  Head CT same day FINDINGS: MRI HEAD FINDINGS Brain: Diffusion imaging shows numerous acute infarctions affecting the cortical and subcortical brain extending from front to back on the left consistent with watershed distribution infarctions. No evidence of mass effect or detectable  hemorrhage. No acute infarction in the right hemisphere, brainstem or cerebellum. Mild chronic small-vessel change affects the hemispheric white matter. No hydrocephalus or extra-axial collection. Vascular: Major vessels at the base of the brain show flow. Skull and upper cervical spine: Negative Sinuses/Orbits: Clear/normal Other: None MRA HEAD FINDINGS Both internal carotid arteries are patent through the skull base and siphon regions. On the right, the anterior and middle cerebral vessels are patent. Question mild stenosis of the proximal M2 branches on the right. On the left, the anterior cerebral artery is widely patent. The left M1 segment is widely patent. There is severe narrowing of the proximal M2 branches on the left. This could be due to plaque or nonocclusive embolus. Both vertebral arteries widely patent to the basilar. No basilar stenosis. Stenotic change of the right superior cerebellar artery. Left superior cerebellar artery is normal. Both posterior cerebral arteries are patent. Some atherosclerotic irregularity of more distal branch vessels, right more than left. MRA NECK FINDINGS Branching pattern from the arch is normal. Common origin of the innominate artery and left common carotid artery. Both common carotid arteries are widely patent to the bifurcation. Minimal atherosclerotic change at both carotid bifurcations but no stenosis. Both cervical internal carotid arteries are normal. Both vertebral arteries are widely patent through the cervical region to the basilar. IMPRESSION: 1. Numerous acute infarctions affecting the cortical and subcortical brain extending from front to back on the left at the junction of the ACA and MCA territories consistent with watershed distribution infarctions. No evidence of mass effect or hemorrhage. 2. Severe narrowing of the proximal M2 branch vessels on the left, which could be due to plaque or nonocclusive embolus. 3. Intracranial MR angiography of the neck  vessels shows only minimal atherosclerotic change at the carotid bifurcations without flow limiting stenosis or pronounced irregularity. 4. Mild atherosclerotic narrowing of the proximal M2 branches on the right. Atherosclerotic narrowing of the right superior cerebellar artery. Mild atherosclerotic irregularity of the more distal PCA branches right more than left. Electronically Signed   By: Nelson Chimes M.D.   On: 03/21/2020 17:42   ECHOCARDIOGRAM COMPLETE BUBBLE STUDY  Result Date: 03/23/2020    ECHOCARDIOGRAM REPORT   Patient Name:   Sharon Blankenship Date of Exam: 03/23/2020 Medical Rec #:  [id number]     Height:       66.0 in Accession #:    [id number]    Weight:       245.1 lb Date of Birth:  09/04/1965     BSA:          2.181 m Patient Age:    54 years      BP:           113/99 mmHg Patient Gender: F             HR:           92 bpm. Exam Location:  Inpatient Procedure: 2D Echo, Cardiac Doppler, Color Doppler, 3D Echo and Saline Contrast            Bubble Study Indications:   Stroke  History:       Patient has no prior history of Echocardiogram examinations.                Stroke.  Sonographer:   Roseanna Rainbow RDCS Referring      [id number] [place] Phys:  Sonographer Comments: Technically difficult study due to poor echo windows and patient is morbidly obese. Image acquisition challenging due to patient body habitus. IMPRESSIONS  1. Left ventricular ejection fraction, by estimation, is 55 to 60%. The left ventricle has normal function. The left ventricle has no regional wall motion abnormalities. There is mild concentric left ventricular hypertrophy. Left ventricular diastolic parameters are consistent with Grade I diastolic dysfunction (impaired relaxation).  2. Right ventricular systolic function is normal. The right ventricular size is normal.  3. The mitral valve is degenerative. No evidence of mitral valve regurgitation.  4. The aortic valve is tricuspid. There is mild calcification of the aortic  valve. There is mild thickening of the aortic valve. Aortic valve regurgitation is not visualized.  5. Agitated saline contrast bubble study was negative, with no evidence of any interatrial shunt. Comparison(s): No prior Echocardiogram. FINDINGS  Left Ventricle: Left ventricular ejection fraction, by estimation, is 55 to 60%. The left ventricle has normal function. The left ventricle has no regional wall motion abnormalities. The left ventricular internal cavity size was normal in size. There is  mild concentric left ventricular hypertrophy. Left ventricular diastolic parameters are consistent with Grade I diastolic dysfunction (impaired relaxation). Right Ventricle: The right ventricular size is normal. No increase in right ventricular wall thickness. Right ventricular systolic function is normal. Left Atrium: Left atrial size was normal in size. Right Atrium: Right atrial size was normal in size. Pericardium: There is no evidence of pericardial effusion. Mitral Valve: The mitral valve is degenerative in appearance. There is mild thickening of the mitral valve leaflet(s). There is mild calcification of the mitral valve leaflet(s). Mild to moderate mitral annular calcification. No evidence of mitral valve regurgitation. Tricuspid Valve: The tricuspid valve is normal in structure. Tricuspid valve regurgitation is not demonstrated. Aortic Valve: The aortic valve is  tricuspid. There is mild calcification of the aortic valve. There is mild thickening of the aortic valve. Aortic valve regurgitation is not visualized. Pulmonic Valve: The pulmonic valve was normal in structure. Pulmonic valve regurgitation is trivial. Aorta: The aortic root and ascending aorta are structurally normal, with no evidence of dilitation. IAS/Shunts: No atrial level shunt detected by color flow Doppler. Agitated saline contrast was given intravenously to evaluate for intracardiac shunting. Agitated saline contrast bubble study was negative,  with no evidence of any interatrial shunt.  LEFT VENTRICLE PLAX 2D LVIDd:         3.60 cm      Diastology LVIDs:         2.30 cm      LV e' medial:    6.31 cm/s LV PW:         1.30 cm      LV E/e' medial:  10.6 LV IVS:        1.20 cm      LV e' lateral:   8.59 cm/s LVOT diam:     2.10 cm      LV E/e' lateral: 7.8 LV SV:         58 LV SV Index:   27 LVOT Area:     3.46 cm  LV Volumes (MOD) LV vol d, MOD A2C: 104.0 ml LV vol d, MOD A4C: 68.6 ml LV vol s, MOD A2C: 36.3 ml LV vol s, MOD A4C: 28.8 ml LV SV MOD A2C:     67.7 ml LV SV MOD A4C:     68.6 ml LV SV MOD BP:      53.1 ml RIGHT VENTRICLE             IVC RV S prime:     10.20 cm/s  IVC diam: 2.00 cm TAPSE (M-mode): 1.8 cm LEFT ATRIUM             Index       RIGHT ATRIUM           Index LA diam:        2.40 cm 1.10 cm/m  RA Area:     12.00 cm LA Vol (A2C):   26.1 ml 11.97 ml/m RA Volume:   29.10 ml  13.35 ml/m LA Vol (A4C):   20.3 ml 9.31 ml/m LA Biplane Vol: 24.5 ml 11.24 ml/m  AORTIC VALVE             PULMONIC VALVE LVOT Vmax:   102.00 cm/s PR End Diast Vel: 1.53 msec LVOT Vmean:  70.400 cm/s LVOT VTI:    0.167 m  AORTA Ao Root diam: 3.30 cm Ao Asc diam:  3.10 cm MITRAL VALVE MV Area (PHT): 5.54 cm    SHUNTS MV Decel Time: 137 msec    Systemic VTI:  0.17 m MV E velocity: 66.80 cm/s  Systemic Diam: 2.10 cm MV A velocity: 81.80 cm/s MV E/A ratio:  0.82 Gwyndolyn Kaufman MD Electronically signed by Gwyndolyn Kaufman MD Signature Date/Time: 03/23/2020/2:39:27 PM    Final     Microbiology: Recent Results (from the past 240 hour(s))  Resp Panel by RT-PCR (Flu A&B, Covid) Nasopharyngeal Swab     Status: None   Collection Time: 03/21/20  1:07 PM   Specimen: Nasopharyngeal Swab; Nasopharyngeal(NP) swabs in vial transport medium  Result Value Ref Range Status   SARS Coronavirus 2 by RT PCR NEGATIVE NEGATIVE Final    Comment: (NOTE) SARS-CoV-2 target nucleic acids are NOT DETECTED.  The  SARS-CoV-2 RNA is generally detectable in upper  respiratory specimens during the acute phase of infection. The lowest concentration of SARS-CoV-2 viral copies this assay can detect is 138 copies/mL. A negative result does not preclude SARS-Cov-2 infection and should not be used as the sole basis for treatment or other patient management decisions. A negative result may occur with  improper specimen collection/handling, submission of specimen other than nasopharyngeal swab, presence of viral mutation(s) within the areas targeted by this assay, and inadequate number of viral copies(<138 copies/mL). A negative result must be combined with clinical observations, patient history, and epidemiological information. The expected result is Negative.  Fact Sheet for Patients:  EntrepreneurPulse.com.au  Fact Sheet for Healthcare Providers:  IncredibleEmployment.be  This test is no t yet approved or cleared by the [place] FDA and  has been authorized for detection and/or diagnosis of SARS-CoV-2 by FDA under an Emergency Use Authorization (EUA). This EUA will remain  in effect (meaning this test can be used) for the duration of the COVID-19 declaration under Section 564(b)(1) of the Act, 21 U.S.C.section 360bbb-3(b)(1), unless the authorization is terminated  or revoked sooner.       Influenza A by PCR NEGATIVE NEGATIVE Final   Influenza B by PCR NEGATIVE NEGATIVE Final    Comment: (NOTE) The Xpert Xpress SARS-CoV-2/FLU/RSV plus assay is intended as an aid in the diagnosis of influenza from Nasopharyngeal swab specimens and should not be used as a sole basis for treatment. Nasal washings and aspirates are unacceptable for Xpert Xpress SARS-CoV-2/FLU/RSV testing.  Fact Sheet for Patients: EntrepreneurPulse.com.au  Fact Sheet for Healthcare Providers: IncredibleEmployment.be  This test is not yet approved or cleared by the [place] FDA and has been  authorized for detection and/or diagnosis of SARS-CoV-2 by FDA under an Emergency Use Authorization (EUA). This EUA will remain in effect (meaning this test can be used) for the duration of the COVID-19 declaration under Section 564(b)(1) of the Act, 21 U.S.C. section 360bbb-3(b)(1), unless the authorization is terminated or revoked.  Performed at [facility], [place] [address] [place], [place] [address]      Labs: CBC: Recent Labs  Lab 03/21/20 0725 03/23/20 0739  WBC 7.6 5.4  HGB 11.0* 9.8*  HCT 32.7* 30.0*  MCV 80.9 80.6  PLT 313 242   Basic Metabolic Panel: Recent Labs  Lab 03/21/20 0725 03/22/20 0520 03/23/20 0739  NA 141 142 143  K 3.4* 3.3* 3.2*  CL 114* 115* 114*  CO2 13* 12* 17*  GLUCOSE 98 115* 127*  BUN 22* 21* 17  CREATININE 1.47* 1.22* 1.12*  CALCIUM 9.6 9.1 9.2  MG  --   --  1.5*   Liver Function Tests: Recent Labs  Lab 03/21/20 1157  AST 29  ALT 61*  ALKPHOS 136*  BILITOT 0.9  PROT 7.6  ALBUMIN 3.7   CBG: Recent Labs  Lab 03/22/20 1727 03/22/20 2155 03/23/20 0832 03/23/20 1336 03/23/20 1631  GLUCAP 175* 203* 118* 225* 212*    Time spent: 35 minutes  Signed:  Berle Mull  [facility] 03/23/2020 11:12 PM

## 2020-03-23 NOTE — Progress Notes (Signed)
STROKE TEAM PROGRESS NOTE   HISTORY OF PRESENT ILLNESS (per record) Sharon Blankenship is a 54 y.o. female with a medical history significant for obesity, glaucoma, hypertension, and type 2 diabetes who presented to [facility] ED with complaints of approximately 2 days of intermittent numbness and weakness of her right upper extremity (hand weaker than right upper arm). Per her spouse, he has noticed her "stumbling around the apartment" and increased confusion and slurred speech he first noticed last night 12/16. Sharon Blankenship states she was started on acetazolamide for her glaucoma about 3 months ago and noted double vision, confusion, dizziness, and incoordination with ambulation since starting this medication. She states when she gets up to use the restroom at night she has to "hold onto the walls" and that the medication has made her experience generalized weakness.  LKW: Approximately 2 days ago, exact time unknown (symptoms intermittent) tpa given?: no, outside of time window  INTERVAL HISTORY Her family is not at bedside. Patient is alert, laying in bed, neuro stable. We discussed possible causes of her stroke again, patient reports dehydration prior to event due to multiple diuretics. CTA favors large vessel athersclerotic disease rather than emboic phenomenon for the left M2 branch stenoses, discussed with Dr. Ronnald Ramp in neuroradiology as well. She was not on ASA81mg  at home, had stopped it for 2 weeks.    OBJECTIVE Vitals:   03/22/20 2008 03/23/20 0022 03/23/20 0339 03/23/20 0834  BP: 123/62 128/70 (!) 144/69 140/78  Pulse: 87 96 95 89  Resp: 17  20 18   Temp: 97.6 F (36.4 C) 98.3 F (36.8 C) 98.4 F (36.9 C) 97.7 F (36.5 C)  TempSrc: Oral Oral Oral Oral  SpO2: 99% 100% 100% 99%  Weight:      Height:        CBC:  Recent Labs  Lab 03/21/20 0725 03/23/20 0739  WBC 7.6 5.4  HGB 11.0* 9.8*  HCT 32.7* 30.0*  MCV 80.9 80.6  PLT 313 253    Basic Metabolic Panel:  Recent Labs  Lab  03/22/20 0520 03/23/20 0739  NA 142 143  K 3.3* 3.2*  CL 115* 114*  CO2 12* 17*  GLUCOSE 115* 127*  BUN 21* 17  CREATININE 1.22* 1.12*  CALCIUM 9.1 9.2  MG  --  1.5*    Lipid Panel:     Component Value Date/Time   CHOL 201 (H) 03/22/2020 0520   TRIG 152 (H) 03/22/2020 0520   HDL 24 (L) 03/22/2020 0520   CHOLHDL 8.4 03/22/2020 0520   VLDL 30 03/22/2020 0520   LDLCALC 147 (H) 03/22/2020 0520   HgbA1c:  Lab Results  Component Value Date   HGBA1C 6.3 (H) 03/22/2020   Urine Drug Screen: No results found for: LABOPIA, COCAINSCRNUR, LABBENZ, AMPHETMU, THCU, LABBARB  Alcohol Level No results found for: [facility]  IMAGING  DG Chest 2 View 03/21/2020 IMPRESSION:  Patchy retrocardiac opacities, atelectasis versus infiltrate. Mild hypoinflation.   CT Head Wo Contrast 03/21/2020 IMPRESSION:  1. Small area of patchy hypodensity in the left frontal lobe with loss of the normal gray-white matter differentiation, concerning for acute to subacute infarct given clinical history.  2. Age indeterminate small white matter infarct in the left occipital lobe.   MR BRAIN WO CONTRAST MR ANGIO HEAD WO CONTRAST MR ANGIO NECK W WO CONTRAST 03/21/2020 IMPRESSION:  1. Numerous acute infarctions affecting the cortical and subcortical brain extending from front to back on the left at the junction of the ACA and MCA territories consistent  with watershed distribution infarctions. No evidence of mass effect or hemorrhage.  2. Severe narrowing of the proximal M2 branch vessels on the left, which could be due to plaque or nonocclusive embolus.  3. Intracranial MR angiography of the neck vessels shows only minimal atherosclerotic change at the carotid bifurcations without flow limiting stenosis or pronounced irregularity.  4. Mild atherosclerotic narrowing of the proximal M2 branches on the right. Atherosclerotic narrowing of the right superior cerebellar artery. Mild atherosclerotic irregularity of the more  distal PCA branches right more than left.   Transthoracic Echocardiogram w Bubbles 00/00/2021 Pending - not sure why this has not been done - called Cardiology -> referred to echo tech - tech said they have been very busy but will prioritize pt. Asked cardiology to expedite reading.  ECG - ST rate 120 BPM. (See cardiology reading for complete details)  PHYSICAL EXAM Blood pressure 140/78, pulse 89, temperature 97.7 F (36.5 C), temperature source Oral, resp. rate 18, height 5\' 6"  (1.676 m), weight 111.2 kg, last menstrual period 04/24/2014, SpO2 99 %.   Patient is an obese African-American female laying in bed in no acute distress, she is awake and alert, oriented to person place time and situation, speech is clear, no aphasia or dysarthria, naming repetition intact, she is a good historian, pupils equally round and reactive to light, sensation intact in the face, face is symmetric, tongue is midline without fasciculations, patient can hold all extremities antigravity without drift, however right upper extremity appears slightly weaker on strength testing, tone is normal, intact to sensation in all extremities, coordination and gait deferred.   ASSESSMENT/PLAN Sharon Blankenship is a 54 y.o. female with history of obesity, glaucoma, hypertension, and type 2 diabetes who presented to [facility] ED with complaints of approximately 2 days of intermittent numbness and weakness of her right upper extremity (hand weaker than right upper arm) , gait disturbance, increased confusion and slurred speech. Sharon Blankenship states she was started on acetazolamide for her glaucoma about 3 months ago and noted double vision, confusion, dizziness, and incoordination with ambulation since starting this medication. She did not receive IV t-PA due to late presentation (>4.5 hours from time of onset).  Stroke: multiple Lt MCA territory embolic stroke - likely due to atheroembolic phenomena  with symptomatic intra-cerebral  atherosclerosis or watershed infarcts in the setting of hypotension on multiple diuretics.  (CTA favors large vessel athersclerotic disease rather than emboic phenomenon for the left M2 branch stenoses, discussed with Dr. Ronnald Ramp in neuroradiology as well. She was not on ASA81mg  at home, had stopped it for 2 weeks.)  Code Stroke CT Head - not ordered  CT head - Small area of patchy hypodensity in the left frontal lobe with loss of the normal gray-white matter differentiation, concerning for acute to subacute infarct given clinical history. Age indeterminate small white matter infarct in the left occipital lobe.   MRI head - Numerous acute infarctions affecting the cortical and subcortical brain extending from front to back on the left at the junction of the ACA and MCA territories consistent with watershed distribution infarctions. No evidence of mass effect or hemorrhage  MRA H&N - Severe narrowing of the proximal M2 branch vessels on the left, which could be due to plaque or nonocclusive embolus.   CTA H&N - Similar as MRA, favor large vessel atherosclerosis rather than embolic phenomenon.   CT Perfusion - not ordered  Carotid Doppler - MRA neck ordered - carotid dopplers not indicated. Pending CTA H&N.  2D Echo w bubbles - pending  Lacey Jensen Virus 2  - negative  LDL - 159  HgbA1c - 6.6  UDS - not ordered  VTE prophylaxis - Lovenox Diet  Diet Order            Diet heart healthy/carb modified Room service appropriate? Yes; Fluid consistency: Thin  Diet effective now                 No antithrombotic prior to admission, now on aspirin 81 mg daily and clopidogrel 75 mg daily for 90 days and then aspirin alone.   Patient counseled to be compliant with her antithrombotic medications  Ongoing aggressive stroke risk factor management  Therapy recommendations:  pending  Disposition:  Pending  Hypertension  Home BP meds: HCTZ ; Cozaar  Current BP meds: Hydralazine  prn  Stable . Permissive hypertension (OK if < 220/120) but gradually normalize in 5-7 days  . Long-term BP goal normotensive  Hyperlipidemia  Home Lipid lowering medication: none   LDL 159, goal < 70  Current lipid lowering medication: none / NPO   Continue statin at discharge  Diabetes  Home diabetic meds: metformin ; Tyler Aas  Current diabetic meds: SSI   HgbA1c 6.6, goal < 7.0 Recent Labs    03/22/20 1727 03/22/20 2155 03/23/20 0832  GLUCAP 175* 203* 118*    Other Stroke Risk Factors  Obesity, recommend weight loss, diet and exercise as appropriate   Family hx stroke - not on file  Consider sleep evaluation outpatient for obstructive sleep apnea, refer to Larey Seat at [facility] for evaluation.  Other Active Problems, Findings and Recommendations  Code status - Full code   Anemia - Hgb - 11.0->9.8  Hypokalemia - potassium - 3.4 - supplement when swallowing has been cleared (shortage of IV KCL) 3.3->3.2 - supplemented. - supplement when swallowing has been cleared (shortage of IV KCL)  AKI vs CKD - creatinine - 1.47->1.12  Plan:  Hypercoag labs - ordered  30-day heart monitor outpatient  DUAP 90 days then asa alone  Recommend systolic > 456 due to atherosclerosis  Follow up with Dr. Leonie Man outpatient  She denies snoring or daytime somnolence, sleep study in the future if indicated  Potassium supplemented.  Await result of echo with bubble study. If unremarkable, stroke will sign off.    Hospital day # 2   Personally examined patient and images, and have participated in and made any corrections needed to history, physical, neuro exam,assessment and plan as stated above.  I have personally obtained the history, evaluated lab date, reviewed imaging studies and agree with radiology interpretations.    Sarina Ill, MD Stroke Neurology  I spent 45 minutes of face-to-face and non-face-to-face time with patient. This  included prechart review, lab review, study review, order entry, electronic health record documentation, patient education on the different diagnostic and therapeutic options, counseling and coordination of care, risks and benefits of management, compliance, or risk factor reduction  To contact Stroke Continuity provider, please refer to [email or web address] After hours, contact General Neurology

## 2020-03-23 NOTE — Evaluation (Signed)
Speech Language Pathology Evaluation Patient Details Name: Sharon Blankenship MRN: [id number] DOB: Aug 13, 1965 Today's Date: 03/23/2020 Time: 1230-1300 SLP Time Calculation (min) (ACUTE ONLY): 30 min  Problem List:  Patient Active Problem List   Diagnosis Date Noted  . CVA (cerebral vascular accident) ([place]) 03/21/2020   Past Medical History:  Past Medical History:  Diagnosis Date  . Diabetes mellitus without complication ([place])   . Hypertension    Past Surgical History:  Past Surgical History:  Procedure Laterality Date  . ABDOMINAL HYSTERECTOMY     2016   HPI:  Sharon Blankenship is a 54 y.o. female with medical history significant of HTN, IDDM, chronic closed angle glaucoma, morbid obesity, presented with new onset right-sided weakness. Her symptoms started 2 days ago, initially was intermittent then became constant. Symptoms involve right-sided weakness mainly on the right arm, associated with numbness. Patient was recently started on acetazolamide for her worsening glaucoma, and she has been having intermittent weakness dizziness and confusion, which she attributed to the side effect of medication. MRI shows numerous acute infarcts in cortical and subcortical brain. in [place] and MCA territories.   Assessment / Plan / Recommendation Clinical Impression  Patient presents with a mild cognitive impairment with areas of higher level cognitive processing and memory impacted but overall reasoning, safety awareness, problem solving, attention and orientation were WFL-WNL. Patient did state that prior to this hospitalization, she was getting overwhelmed by all the different things she was doing in her job, etc. She feels that she might have adult-onset ADHD. Her difficulty in multitasking is part of the reason she quit her job as per patient report. She recalled 3/5 words after 2 minute delay and 3/4 delayed recall questions after SLP read paragraph long story. Recommending patient be evaluated by  outpatient SLP as she is living independently, going to college and potentially working again.    SLP Assessment  SLP Recommendation/Assessment: All further Speech Lanaguage Pathology  needs can be addressed in the next venue of care SLP Visit Diagnosis: Cognitive communication deficit (R41.841)    Follow Up Recommendations  Outpatient SLP    Frequency and Duration   N/a        SLP Evaluation Cognition  Overall Cognitive Status: Impaired/Different from baseline Arousal/Alertness: Awake/alert Orientation Level: Oriented X4 Attention: Selective Selective Attention: Appears intact Memory: Impaired Memory Impairment: Other (comment) (recalled 3/5 words after 2 minute delay) Awareness: Appears intact Problem Solving: Appears intact Safety/Judgment: Appears intact       Comprehension  Auditory Comprehension Overall Auditory Comprehension: Appears within functional limits for tasks assessed    Expression Expression Primary Mode of Expression: Verbal Verbal Expression Overall Verbal Expression: Appears within functional limits for tasks assessed   Oral / Motor  Oral Motor/Sensory Function Overall Oral Motor/Sensory Function: Within functional limits Motor Speech Overall Motor Speech: Appears within functional limits for tasks assessed   GO                    Sonia Baller, MA, CCC-SLP Speech Therapy [facility]

## 2020-03-23 NOTE — Progress Notes (Signed)
Occupational Therapy Evaluation Patient Details Name: Sharon Blankenship MRN: [id number] DOB: [birth year] Today's Date: 03/23/2020    History of Present Illness Sharon Blankenship is a 54 y.o. female with medical history significant of HTN, IDDM, chronic closed angle glaucoma, morbid obesity, presented with new onset right-sided weakness. Her symptoms started 2 days ago, initially was intermittent then became constant. Symptoms involve right-sided weakness mainly on the right arm, associated with numbness. Patient was recently started on acetazolamide for her worsening glaucoma, and she has been having intermittent weakness dizziness and confusion, which she attributed to the side effect of medication. MRI shows numerous acute infarcts in cortical and subcortical brain. in [place] and MCA territories.   Clinical Impression   Pt baseline indep with all ADL's and mobility, lives at home with roommate, unable to elaborate level of S/A from them, but that sister lives near, but works during the day. Currently pt with acute focal deficits of sensory, coordination and weakness to R UE>LE leading to consistently needing min guard for functional transfers and ADL's at this time. Increased difficulty with FM tasks. Fair insight to deficits, with OT educating pt on BE FAST for s/s of stroke with pt's plan for d/c to home as well as FM/GM tasks for increased ease and indep with ADL's. OT will continue to follow acutely, with recommendations listed below.      Follow Up Recommendations  Home health OT;Supervision/Assistance - 24 hour    Equipment Recommendations  Tub/shower seat    Recommendations for Other Services       Precautions / Restrictions Precautions Precautions: Fall Restrictions Weight Bearing Restrictions: No Other Position/Activity Restrictions: baseline carpal tunnel and R rotator cuff deficits reported by pt      Mobility Bed Mobility Overal bed mobility: Modified Independent                   Transfers Overall transfer level: Needs assistance Equipment used: Rolling walker (2 wheeled) Transfers: Sit to/from Stand Sit to Stand: Supervision         General transfer comment: No LOB, fair safety    Balance Overall balance assessment: Mild deficits observed, not formally tested                                         ADL either performed or assessed with clinical judgement   ADL Overall ADL's : Needs assistance/impaired Eating/Feeding: Set up   Grooming: Wash/dry hands;Set up;Standing   Upper Body Bathing: Minimal assistance;Sitting   Lower Body Bathing: Min guard   Upper Body Dressing : Supervision/safety;Sitting   Lower Body Dressing: Min guard   Toilet Transfer: Min guard;RW   Toileting- Clothing Manipulation and Hygiene: Min guard   Tub/ Banker: Minimal assistance   Functional mobility during ADLs: Min guard;Rolling walker       Vision Baseline Vision/History: Glaucoma Patient Visual Report: No change from baseline Vision Assessment?: No apparent visual deficits     Perception Perception Perception Tested?: No   Praxis Praxis Praxis tested?: Not tested    Pertinent Vitals/Pain Pain Assessment: No/denies pain     Hand Dominance Right   Extremity/Trunk Assessment Upper Extremity Assessment Upper Extremity Assessment: RUE deficits/detail RUE Deficits / Details: slight weakness in R UE RUE Sensation: decreased light touch;decreased proprioception RUE Coordination: decreased fine motor;decreased gross motor   Lower Extremity Assessment Lower Extremity Assessment: Defer to PT evaluation   Cervical /  Trunk Assessment Cervical / Trunk Assessment: Normal   Communication Communication Communication: No difficulties   Cognition Arousal/Alertness: Awake/alert Behavior During Therapy: WFL for tasks assessed/performed Overall Cognitive Status: Within Functional Limits for tasks assessed                                  General Comments: ?insight, would benefit from further assessment with use of pill box test   General Comments  no dizziness reported during session    Exercises     Shoulder Instructions      Home Living Family/patient expects to be discharged to:: Private residence Living Arrangements: Other (Comment) (pt lives with room mate who will be out of town for [place], sister lives a few houses down the road) Available Help at Discharge: Family;Available PRN/intermittently Type of Home: House Home Access: Stairs to enter [employer] of Steps: 2   Home Layout: One level     Bathroom Shower/Tub: Teacher, early years/pre: Standard Bathroom Accessibility: No   Home Equipment: None          Prior Functioning/Environment Level of Independence: Independent                 OT Problem List: Decreased strength;Impaired balance (sitting and/or standing);Decreased coordination;Decreased knowledge of use of DME or AE;Decreased knowledge of precautions;Impaired sensation      OT Treatment/Interventions: Self-care/ADL training;Therapeutic exercise;Neuromuscular education;DME and/or AE instruction;Therapeutic activities;Patient/family education    OT Goals(Current goals can be found in the care plan section) Acute Rehab OT Goals Patient Stated Goal: to return home OT Goal Formulation: With patient Time For Goal Achievement: 04/06/20 Potential to Achieve Goals: Good ADL Goals Pt Will Perform Grooming: with modified independence;standing Pt Will Perform Upper Body Bathing: with modified independence Pt Will Perform Lower Body Bathing: with modified independence Pt Will Perform Upper Body Dressing: with modified independence Pt Will Perform Lower Body Dressing: with modified independence Pt Will Transfer to Toilet: with modified independence;regular height toilet;bedside commode;ambulating Pt Will Perform Tub/Shower Transfer: with modified  independence  OT Frequency: Min 2X/week   Barriers to D/C: Decreased caregiver support          Co-evaluation              AM-PAC OT "6 Clicks" Daily Activity     Outcome Measure Help from another person eating meals?: A Little Help from another person taking care of personal grooming?: A Little Help from another person toileting, which includes using toliet, bedpan, or urinal?: A Little Help from another person bathing (including washing, rinsing, drying)?: A Little Help from another person to put on and taking off regular upper body clothing?: None Help from another person to put on and taking off regular lower body clothing?: A Little 6 Click Score: 19   End of Session Equipment Utilized During Treatment: Gait belt;Rolling walker Nurse Communication: Mobility status  Activity Tolerance: Patient tolerated treatment well Patient left: in bed;with bed alarm set;with call bell/phone within reach  OT Visit Diagnosis: Unsteadiness on feet (R26.81);Other symptoms and signs involving the nervous system (R29.898);Hemiplegia and hemiparesis Hemiplegia - Right/Left: Right Hemiplegia - dominant/non-dominant: Dominant                Time: 9211-9417 OT Time Calculation (min): 31 min Charges:  OT General Charges $OT Visit: 1 Visit OT Evaluation $OT Eval Low Complexity: 1 Low OT Treatments $Self Care/Home Management : 8-22 mins  Rheanna Sergent OTR/L acute  rehab services Office: [place] 03/23/2020, 11:06 AM

## 2020-03-23 NOTE — Progress Notes (Signed)
  Echocardiogram 2D Echocardiogram has been performed.  Sharon Blankenship 03/23/2020, 2:30 PM

## 2020-03-23 NOTE — TOC Transition Note (Signed)
Transition of Care [facility]) - CM/SW Discharge Note   Patient Details  Name: Sharon Blankenship MRN: [id number] Date of Birth: 1965/05/06  Transition of Care [facility]) CM/SW Contact:  Pollie Friar, RN Phone Number: 03/23/2020, 4:06 PM   Clinical Narrative:    Pt is discharging home with [facility] services through [place].  Pt is going to stay with her sister at d/c for a few days before transitioning home.  Pt has transport home today.   Final next level of care: Home w Home Health Services Barriers to Discharge: No Barriers Identified   Patient Goals and CMS Choice   CMS Medicare.gov Compare Post Acute Care list provided to:: Patient Choice offered to / list presented to : Patient  Discharge Placement                       Discharge Plan and Services                          HH Arranged: [place] Agency: [place] Date [place]: 03/23/20   Representative spoke with at [place]: [place] ([place]) Interventions     Readmission Risk Interventions No flowsheet data found.

## 2020-03-24 ENCOUNTER — Telehealth: Payer: Self-pay | Admitting: *Deleted

## 2020-03-24 NOTE — Telephone Encounter (Signed)
Per TOC note at discharge, patient will be staying with her sister a few days after discharge.  Attempted to contact patient to confirm shipping address for ordered Cardiac event monitor.  Please call Rani Idler/ [employer] at [phone].

## 2020-03-25 LAB — PROTEIN S, TOTAL: Protein S Ag, Total: 136 % (ref 60–150)

## 2020-03-25 LAB — PROTEIN S ACTIVITY: Protein S Activity: 119 % (ref 63–140)

## 2020-03-25 LAB — PROTEIN C ACTIVITY: Protein C Activity: 157 % (ref 73–180)

## 2020-03-25 LAB — HOMOCYSTEINE: Homocysteine: 11.4 umol/L (ref 0.0–14.5)

## 2020-03-25 LAB — BETA-2-GLYCOPROTEIN I ABS, IGG/M/A
Beta-2 Glyco I IgG: <9 GPI IgG units (ref 0–20)
Beta-2-Glycoprotein I IgA: <9 GPI IgA units (ref 0–25)
Beta-2-Glycoprotein I IgM: <9 GPI IgM units (ref 0–32)

## 2020-03-26 LAB — LUPUS ANTICOAGULANT PANEL
DRVVT: 54.4 s — ABNORMAL HIGH (ref 0.0–47.0)
PTT Lupus Anticoagulant: 43.9 s (ref 0.0–51.9)

## 2020-03-26 LAB — CARDIOLIPIN ANTIBODIES, IGG, IGM, IGA
Anticardiolipin IgA: <9 APL U/mL (ref 0–11)
Anticardiolipin IgG: <9 GPL U/mL (ref 0–14)
Anticardiolipin IgM: <9 MPL U/mL (ref 0–12)

## 2020-03-26 LAB — DRVVT MIX: dRVVT Mix: 44.4 s — ABNORMAL HIGH (ref 0.0–40.4)

## 2020-03-26 LAB — PROTEIN C, TOTAL: Protein C, Total: 128 % (ref 60–150)

## 2020-03-26 LAB — DRVVT CONFIRM: dRVVT Confirm: 1.4 ratio — ABNORMAL HIGH (ref 0.8–1.2)

## 2020-03-27 LAB — FACTOR 5 LEIDEN

## 2020-03-27 LAB — PROTHROMBIN GENE MUTATION

## 2020-03-29 NOTE — Progress Notes (Signed)
Ok will need to se her soon and will address it

## 2020-03-31 ENCOUNTER — Telehealth: Payer: Self-pay | Admitting: *Deleted

## 2020-03-31 NOTE — Telephone Encounter (Signed)
Please contact Kevonna Nolte at [facility] [phone] to arrange Cardiac event monitor.

## 2020-04-01 ENCOUNTER — Telehealth: Payer: Self-pay | Admitting: Neurology

## 2020-04-01 NOTE — Telephone Encounter (Signed)
Reached patient by phone to inform that Dr Pearlean Brownie would like to see patient regarding her labs.  We have an appointment on hold for 04/02/20 @ 1400 pending her acceptance.  She stated she would call me back as soon as she could regarding securing a ride here.    If patient calls back she can be scheduled for 04/02/20 @ 1400.  Thank you.

## 2020-04-01 NOTE — Telephone Encounter (Signed)
Pt returned call and due to her health issues she is unable to drive. She can complete a telephone visit is MD will do that. Reached out to his nurse who checked with him and he agreed to a mychart video visit. I have sent the patient the mychart link and walked her through the steps of signing up on the mychart. Pt will call us if she has any issues.

## 2020-04-01 NOTE — Telephone Encounter (Signed)
-----   Message from Micki Riley, MD sent at 03/29/2020 10:29 AM EST ----- Ok will need to se her soon and will address it

## 2020-04-01 NOTE — Telephone Encounter (Signed)
Called the patient to review the lab result and get her worked into Dr Pearlean Brownie schedule the number [phone] is a no longer valid number for this patient. The person that answered said people keep calling but its wrong number. I then attempted to call the number on file for her sister who answered and was able to provide me with the patient's correct number, [phone] I will update the number in the system. Called that number. No answer. LVM for pt to call. If pt returns call please advise that Dr Pearlean Brownie is wanting [place] to schedule an apt with her to discuss lab results. I have placed an appt on hold with Dr Pearlean Brownie for Dec 29,2021 at 2 pm for the pt.

## 2020-04-02 ENCOUNTER — Telehealth (INDEPENDENT_AMBULATORY_CARE_PROVIDER_SITE_OTHER): Payer: BLUE CROSS/BLUE SHIELD | Admitting: Neurology

## 2020-04-02 DIAGNOSIS — I63312 Cerebral infarction due to thrombosis of left middle cerebral artery: Secondary | ICD-10-CM

## 2020-04-02 NOTE — Progress Notes (Signed)
Virtual Visit via Video Note  I connected with Sharon Blankenship on 04/02/20 at  2:00 PM EST by a video enabled telemedicine application and verified that I am speaking with the correct person using two identifiers.  Location: Patient: At home Provider: [facility] office   I discussed the limitations of evaluation and management by telemedicine and the availability of in person appointments. The patient expressed understanding and agreed to proceed.  History of Present Illness: Sharon Blankenship is a 54 year old African-American lady seen for virtual video visit today upon her request as she could not leave the home due to her diminished vision from worsening glaucoma following recent hospital consult for stroke. Sharon Blankenship is a 54 y.o. female with a medical history significant for obesity, glaucoma, hypertension, and type 2 diabetes who presented to [facility] ED with complaints of approximately 2 days of intermittent numbness and weakness of her right upper extremity (hand weaker than right upper arm). Per her spouse, he has noticed her "stumbling around the apartment" and increased confusion and slurred speech he first noticed last night 12/16. Sharon Blankenship states she was started on acetazolamide for her glaucoma about 3 months ago and noted double vision, confusion, dizziness, and incoordination with ambulation since starting this medication. She states when she got up to use the restroom at night she has to "hold onto the walls" and that the medication has made her experience generalized weakness.   She was not considered for TPA as she presented more than 2 days after onset of her symptoms.  CT scan of the head showed small area of patchy hypodensity in the left frontal lobe possible infarct.  MRI scan confirmed numerous acute infarcts involving cortical and subcortical region from the front to the back from frontal and parietal lobes in a watershed-like distribution.  MRA of the brain showed severe narrowing of the  proximal M2 branch on the left and CT angiogram of the brain and neck showed similar findings suggestive of large vessel atherosclerosis.  CT angiogram neck showed no significant extracranial carotid stenosis in the neck.  2D echo showed normal ejection fraction.  LDL cholesterol is elevated 159 mg percent.  Hemoglobin A1c was borderline at 6.6.  Patient was started on dual antiplatelet therapy aspirin and Plavix and states she is done well since discharge.  She is getting home physical and occupational therapy.  She is started now writing with the right hand and gripping and has noticed improvement.  Her speech and face has improved.  She is able to ambulate without assistance.  She states she is able to do almost everything she did before the stroke except it takes her longer than she is slower in doing it.  She is tolerating aspirin Plavix without bruising or bleeding and Lipitor without muscle aches and pains.  She has noticed significant diminished vision due to glaucoma and needs emergency surgery for that and is asking for neurological clearance. Observations/Objective: [name] not in distress.  Physical exam limited due to constraints of virtual video visit .  Neurological exam also constrained due to virtual video visit.  She seemed awake alert oriented to time place and person.  Speech and language was normal.  Extraocular movements are full range without nystagmus.  Face was symmetric without weakness.  Tongue was midline.  Motor system exam no upper or lower extremity drift.  Diminished fine finger movements on the right and orbits left over right upper extremity.  Gait seems steady.  Sensation could not be  tested.  Upper extremity coordination seem normal.  Assessment and Plan: multiple Lt MCA territory embolic stroke in December 2021- likely due to atheroembolic phenomena  with symptomatic intra-cerebral atherosclerosis or watershed infarcts in the setting of  hypotension on multiple diuretics.  Multiple vascular risk factors of obesity, diabetes, hypertension, hyperlipidemia and suspected sleep apnea and intracranial atherosclerosis   Follow Up Instructions:I had a long d/w patient about his recent stroke, risk for recurrent stroke/TIAs, personally independently reviewed imaging studies and stroke evaluation results and answered questions.Continue aspirin 81 mg and Plavix 75 mg daily for 3 months for secondary stroke prevention and maintain strict control of hypertension with blood pressure goal below 130/90, diabetes with hemoglobin A1c goal below 6.5% and lipids with LDL cholesterol goal below 70 mg/dL. I also advised the patient to eat a healthy diet with plenty of whole grains, cereals, fruits and vegetables, exercise regularly and maintain ideal body weight .patient unfortunately is having loss of vision due to glaucoma and needs urgent surgery for that.  She may stop aspirin 3 days and Plavix 5 days prior to her scheduled eye surgery with a small but acceptable periprocedural risk of TIA/stroke if she is willing.  If she has recurrent left hemispheric strokes may need to consider elective left MCA angioplasty/stenting in the future.  She was advised to call and go to the emergency room if she had recurrent stroke/TIA symptoms.  Followup in the future with me in 3 months or call earlier if necessary.    I discussed the assessment and treatment plan with the patient. The patient was provided an opportunity to ask questions and all were answered. The patient agreed with the plan and demonstrated an understanding of the instructions.   The patient was advised to call back or seek an in-person evaluation if the symptoms worsen or if the condition fails to improve as anticipated.  I provided 29minutes of non-face-to-face time during this encounter.   Antony Contras, MD

## 2020-04-24 ENCOUNTER — Other Ambulatory Visit: Payer: Self-pay | Admitting: Student

## 2020-04-24 ENCOUNTER — Encounter: Payer: Self-pay | Admitting: Student

## 2020-04-24 ENCOUNTER — Ambulatory Visit: Payer: Self-pay | Admitting: Student

## 2020-04-24 ENCOUNTER — Other Ambulatory Visit: Payer: Self-pay

## 2020-04-24 VITALS — BP 174/91 | HR 102 | Temp 98.3°F | Ht 66.5 in | Wt 254.7 lb

## 2020-04-24 DIAGNOSIS — E785 Hyperlipidemia, unspecified: Secondary | ICD-10-CM

## 2020-04-24 DIAGNOSIS — I63512 Cerebral infarction due to unspecified occlusion or stenosis of left middle cerebral artery: Secondary | ICD-10-CM

## 2020-04-24 DIAGNOSIS — H4010X3 Unspecified open-angle glaucoma, severe stage: Secondary | ICD-10-CM

## 2020-04-24 DIAGNOSIS — I10 Essential (primary) hypertension: Secondary | ICD-10-CM

## 2020-04-24 DIAGNOSIS — E119 Type 2 diabetes mellitus without complications: Secondary | ICD-10-CM

## 2020-04-24 DIAGNOSIS — Z794 Long term (current) use of insulin: Secondary | ICD-10-CM

## 2020-04-24 MED ORDER — MONTELUKAST SODIUM 10 MG PO TABS: 10.0000 mg | ORAL_TABLET | Freq: Every day | ORAL | 2 refills | Status: DC

## 2020-04-24 MED ORDER — ACETAZOLAMIDE 125 MG PO TABS: 250.0000 mg | ORAL_TABLET | Freq: Four times a day (QID) | ORAL | 3 refills | Status: DC

## 2020-04-24 MED ORDER — OZEMPIC (1 MG/DOSE) 4 MG/3ML ~~LOC~~ SOPN: 1.0000 mg | PEN_INJECTOR | SUBCUTANEOUS | 0 refills | Status: DC

## 2020-04-24 MED ORDER — TRESIBA FLEXTOUCH 200 UNIT/ML ~~LOC~~ SOPN: 75.0000 [IU] | PEN_INJECTOR | Freq: Every day | SUBCUTANEOUS | 2 refills | Status: DC

## 2020-04-24 MED ORDER — TRESIBA FLEXTOUCH 200 UNIT/ML ~~LOC~~ SOPN
80.0000 [IU] | PEN_INJECTOR | Freq: Every day | SUBCUTANEOUS | 2 refills | Status: DC
Start: 2020-04-24 — End: 2020-04-24

## 2020-04-24 MED ORDER — LOSARTAN POTASSIUM 50 MG PO TABS: 50.0000 mg | ORAL_TABLET | Freq: Every day | ORAL | 11 refills | Status: DC

## 2020-04-24 MED FILL — MONTELUKAST SOD 10 MG TAB: 10 | 30 days supply | Qty: 30 | Fill #0

## 2020-04-24 MED FILL — LOSARTAN POTASSIUM 50 MG TA: 50 | 30 days supply | Qty: 30 | Fill #0

## 2020-04-24 NOTE — Patient Instructions (Addendum)
It was a pleasure seeing you in clinic. Today we discussed:   Hypertension: Start losartan 50 mg daily, keep a daily blood pressure log and bring it to you next visit  Glaucoma: Start acetazolamide 250 mg 4 times daily and make a follow up appointment with you eye doctor.   Diabetes: Please reduce your tresiba to 75 units nightly. Please check you BP 4 times a day one in the morning and 30 min before each meal  Nasal congestion: Please avoid decongestant medication, I have refill your singulair as well   Please follow up in 2 weeks for blood pressure follow up If you have any questions or concerns, please call our clinic at [phone] between 9am-5pm and after hours call [phone] and ask for the internal medicine resident on call. If you feel you are having a medical emergency please call 911.   Thank you, we look forward to helping you remain healthy!

## 2020-04-24 NOTE — Progress Notes (Signed)
New Patient Office Visit  Subjective:  Patient ID: Sharon Blankenship, female    DOB: [birth year]  Age: 55 y.o. MRN: [id number]  CC: Hospital follow up of stroke and establish care  HPI Sharon Blankenship is a 55 year old female with past medical history below presents for follow up of recent stroke and to establish care. Please refer to problem based charting for further details and assessment and plan of current problem and chronic medical conditions.   Past Medical History:  Diagnosis Date  . Diabetes mellitus without complication ([place])   . Hypertension     Past Surgical History:  Procedure Laterality Date  . ABDOMINAL HYSTERECTOMY     2016    Social History   Socioeconomic History  . Marital status: Single    Spouse name: Not on file  . Number of children: Not on file  . Years of education: Not on file  . Highest education level: Not on file  Occupational History  . Not on file  Tobacco Use  . Smoking status: Never Smoker  . Smokeless tobacco: Never Used  Substance and Sexual Activity  . Alcohol use: Never  . Drug use: Never  . Sexual activity: Not on file  Other Topics Concern  . Not on file  Social History Narrative   Lives in [place] with roommate. Sister lives close by. 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  HENT: Positive for congestion and sinus pain. Negative for sore throat.   Eyes: Positive for visual disturbance (blurred vision in the right eye, very limited vision in the left).  Respiratory: Positive for cough. Negative for shortness of breath.   Cardiovascular:  Negative for chest pain and palpitations.  Gastrointestinal: Negative for abdominal distention, abdominal pain, nausea and vomiting.  Endocrine: Negative for polydipsia and polyuria.  Genitourinary: Negative for dysuria.  Musculoskeletal: Negative for arthralgias and joint swelling.  Skin: Negative for color change and rash.  Allergic/Immunologic: Positive for environmental allergies.  Neurological: Positive for weakness (redisual right hand weakness). Negative for dizziness, light-headedness, numbness and headaches.  Psychiatric/Behavioral: Negative for agitation. The patient is nervous/anxious.     Objective:   Today's Vitals: BP (!) 174/91 (BP Location: Left Arm, Cuff Size: Large)   Pulse (!) 102   Temp 98.3 F (36.8 C) (Oral)   Ht 5' 6.5" (1.689 m)   Wt 254 lb 11.2 oz (115.5 kg)   LMP 04/24/2014   SpO2 100% Comment: room air  BMI 40.49 kg/m   Physical Exam Constitutional:      Appearance: She is obese.  HENT:     Head: Normocephalic and atraumatic.     Right Ear: External ear normal.     Left Ear: External ear normal.     Nose: Congestion present.     Mouth/Throat:     Mouth: Mucous membranes are moist.     Pharynx: Oropharynx is clear.  Eyes:     Extraocular Movements: Extraocular movements intact.     Pupils: Pupils are equal, round, and reactive to light.  Cardiovascular:  Rate and Rhythm: Regular rhythm. Tachycardia present.     Pulses: Normal pulses.     Heart sounds: Normal heart sounds.  Pulmonary:     Effort: Pulmonary effort is normal.     Breath sounds: Normal breath sounds. No wheezing.  Abdominal:     General: Abdomen is flat. Bowel sounds are normal.     Palpations: Abdomen is soft.     Tenderness: There is no abdominal tenderness.  Musculoskeletal:     Cervical back: Normal range of motion and neck supple.     Right lower leg: No edema.     Left lower leg: No edema.  Skin:    Capillary Refill: Capillary refill takes less than 2 seconds.   Neurological:     General: No focal deficit present.     Mental Status: She is alert and oriented to person, place, and time. Mental status is at baseline.     Assessment & Plan:   Problem List Items Addressed This Visit      Cardiovascular and Mediastinum   CVA (cerebral vascular accident) [facility])    Patient hospitalized on 03/21/2020 and discharged on 12/19 for left MCA strokein the setting of hypotension on multiple diuretics. Patients states she continues to have some residual right upper extremity weakness that is slowly improving now able to write again. No longer having balance issues. Has been taking aspirin and plavix and followed with Dr. Leonie Man. Patient had hypercoagulable studies while admitted and noted to have elevated LA  And will need repeat labs in 12 weeks from admission. Will need to continue on aspirin and Plavix for 3 months and controlling hypertension, diabetes, and hyperlipidemia.   Plan Continue on aspirin and plavix for 2 more months Restart losartan and acetazolamide Repeat LA around 06/15/2020 Follow up with neurology      Relevant Medications   losartan (COZAAR) 50 MG tablet   acetaZOLAMIDE (DIAMOX) 125 MG tablet   Hypertension - Primary    Patient previously on losartan 100 mg daily and HCTZ 25 mg daily. Was experiencing dizziness, fatigue increased urination on this after place on acetazolamide for her glaucoma. Stopped on BP medication during hospitalization for stroke on 03/21/2020. Has not been on any BP meds since then. Does not check her BP at home. BP in office today elevated to 183/93 and 174/91 on repeat above goal of 140/90 as she has been off BP meds. She denies cp, palpitations, nausea, vomiting, HA, numbness, or tingling. Will restart her losartan today and acetazolamide. Encouraged her to check her BP at home and given blood pressure log.   Plan Restart losartan 50 mg daily and acetazolamide 250 mg 4 times daily BMP today, Cr of 1.04 improved  since hospitalization, appears c/w prior baseline Patient will bring BP log to next visit Follow up in 2 weeks for BP recheck       Relevant Medications   losartan (COZAAR) 50 MG tablet   acetaZOLAMIDE (DIAMOX) 125 MG tablet   Other Relevant Orders   BMP8+Anion Gap (Completed)     Endocrine   Diabetes ([place])    Patient currently on tresiba 80 units nightly, ozempic meformin 1000 mg twice daily, and fiasp 18 units with meals for glucose >130. Recent A1c of 6.3 and appears improved from prior A1c of 11. States blood glucose has been lower since resent hospitalization for stroke. Notes episodes of dizziness and fatigue in morning with blood sugars in the 70s. She checkers CBG about twice a day and uses  her fiasp about once a day. Also follow with podiatry for right charcot joint and recently fitted for diabetic shoes, states she does not wear them often, encouraged she continue with wearing the shoes. Reviewed data from her glucose monitor appears she is having sever eipsoded with glucose in the 70s in the mornings and intermittently during day, and also has highs in the 300s. Appears she is not checking sugars before meals and likely that she is not getting enough mealtime insulin. Will decrease her tresiba as she continue to have symptoms with glucose in 70s.  Discussed as she is on mealtime insulin that she should check blood sugars 4 times a day once in the morning before meals and then 30 minutes before each meal.   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 Was originally scheduled for surgery for this on 04/05/2020 however patient unable to afford medical insurance anymore and surgery was canceled due to this. Currently on brimonidine, dorzolamide-timolol, and latanoprost. Was on acetazolamide 250 mg four times daily for this but was stopped during hospitalization between 12/17/202 and 03/23/2020 for stroke due to concern that hypotension contributed to this. Has not been on her acetazolamide since this. States she has very little vision on the left and blurred vision on the right. Has not had follow with opthalmology since surgery was cancelled, appears officue has tried to reach out to her for follow up and pressure recheck. Will place her back on acetazolamide and she will need to follow up with her ophthalmologist.   Plan Restart acetazolamide 500 mg four times daily. Follow up with ophthalmology       Hyperlipidemia    Patient states she was started on atorvastatin 20 mg daily during hospitalization. States she has been on pravastatin in the past with some myalgias while taking it. Currently stats she has mild muscle aches of her legs on the atorvastatin.  Plan Continue atorvastatin 20 mg daily Lipid panel today with LDL of 74, may consider switching her to rosuvastatin with goal LDL<70 and to avoid muscle symptoms        Relevant Medications   losartan (COZAAR) 50 MG tablet   acetaZOLAMIDE (DIAMOX) 125 MG tablet   Other Relevant Orders   Lipid Profile (Completed)      Outpatient Encounter Medications as of 04/24/2020  Medication Sig  . acetaZOLAMIDE (DIAMOX) 125 MG tablet Take 2 tablets (250 mg total) by mouth in the morning, at noon, in the evening, and at bedtime.  Marland Kitchen losartan (COZAAR) 50 MG tablet Take 1 tablet (50 mg total) by mouth daily.  . montelukast (SINGULAIR) 10 MG tablet Take 1 tablet (10 mg total) by mouth daily.  Marland Kitchen aspirin EC 81 MG EC tablet Take 1 tablet (81 mg total) by mouth daily. Swallow  whole.  Marland Kitchen atorvastatin (LIPITOR) 20 MG tablet Take 20 mg by mouth daily.  Marland Kitchen azelastine (ASTELIN) 0.1 % nasal spray Place 1 spray into both nostrils daily as needed for allergies.  Marland Kitchen  brimonidine (ALPHAGAN) 0.2 % ophthalmic solution Place 1 drop into both eyes 3 (three) times daily.  . clopidogrel (PLAVIX) 75 MG tablet Take 1 tablet (75 mg total) by mouth daily.  . dorzolamide-timolol (COSOPT) 22.3-6.8 MG/ML ophthalmic solution Place 1 drop into the right eye 2 (two) times daily.  . ferrous sulfate 325 (65 FE) MG tablet Take 325 mg by mouth daily with breakfast.  . FIASP FLEXTOUCH 100 UNIT/ML FlexTouch Pen Inject 18 Units into the skin daily as needed (blood sugar).  . gabapentin (NEURONTIN) 100 MG capsule Take 100 mg by mouth 3 (three) times daily as needed (nerve pain).  Marland Kitchen latanoprost (XALATAN) 0.005 % ophthalmic solution Place 1 drop into both eyes at bedtime.  . meloxicam (MOBIC) 15 MG tablet Take 15 mg by mouth daily as needed for pain.  . metFORMIN (GLUCOPHAGE) 1000 MG tablet Take 1,000 mg by mouth 2 (two) times daily with a meal.  . methocarbamol (ROBAXIN) 500 MG tablet Take 500 mg by mouth 3 (three) times daily.  Marland Kitchen OZEMPIC, 1 MG/DOSE, 4 MG/3ML SOPN Inject 1 mg into the skin once a week. On Wednesday  . polyethylene glycol (MIRALAX / GLYCOLAX) packet Take 17 g by mouth daily as needed for mild constipation or moderate constipation.  Tyler Aas FLEXTOUCH 200 UNIT/ML FlexTouch Pen Inject 76 Units into the skin daily.  . [DISCONTINUED] OZEMPIC, 1 MG/DOSE, 4 MG/3ML SOPN Inject 1 mg into the skin once a week. On Wednesday  . [DISCONTINUED] OZEMPIC, 1 MG/DOSE, 4 MG/3ML SOPN Inject 1 mg into the skin once a week. On Wednesday  . [DISCONTINUED] TRESIBA FLEXTOUCH 200 UNIT/ML FlexTouch Pen Inject 80 Units into the skin daily.  . [DISCONTINUED] TRESIBA FLEXTOUCH 200 UNIT/ML FlexTouch Pen Inject 80 Units into the skin daily.  . [DISCONTINUED] TRESIBA FLEXTOUCH 200 UNIT/ML FlexTouch Pen Inject 76 Units  into the skin daily.   No facility-administered encounter medications on file as of 04/24/2020.    Follow-up: Return in about 2 weeks (around 05/08/2020).   [name], MD   Discussed with Dr. Philipp Ovens

## 2020-04-25 ENCOUNTER — Encounter: Payer: Self-pay | Admitting: Student

## 2020-04-25 ENCOUNTER — Institutional Professional Consult (permissible substitution): Payer: BLUE CROSS/BLUE SHIELD | Admitting: Family Medicine

## 2020-04-25 DIAGNOSIS — E785 Hyperlipidemia, unspecified: Secondary | ICD-10-CM | POA: Insufficient documentation

## 2020-04-25 DIAGNOSIS — IMO0002 Reserved for concepts with insufficient information to code with codable children: Secondary | ICD-10-CM | POA: Insufficient documentation

## 2020-04-25 DIAGNOSIS — H4053X3 Glaucoma secondary to other eye disorders, bilateral, severe stage: Secondary | ICD-10-CM | POA: Insufficient documentation

## 2020-04-25 DIAGNOSIS — E1165 Type 2 diabetes mellitus with hyperglycemia: Secondary | ICD-10-CM | POA: Insufficient documentation

## 2020-04-25 DIAGNOSIS — I10 Essential (primary) hypertension: Secondary | ICD-10-CM | POA: Insufficient documentation

## 2020-04-25 LAB — LIPID PANEL
Chol/HDL Ratio: 3.4 ratio (ref 0.0–4.4)
Cholesterol, Total: 133 mg/dL (ref 100–199)
HDL: 39 mg/dL — ABNORMAL LOW (ref >39)
LDL Chol Calc (NIH): 74 mg/dL (ref 0–99)
Triglycerides: 105 mg/dL (ref 0–149)
VLDL Cholesterol Cal: 20 mg/dL (ref 5–40)

## 2020-04-25 LAB — BMP8+ANION GAP
Anion Gap: 15 mmol/L (ref 10.0–18.0)
BUN/Creatinine Ratio: 15 (ref 9–23)
BUN: 16 mg/dL (ref 6–24)
CO2: 21 mmol/L (ref 20–29)
Calcium: 9.2 mg/dL (ref 8.7–10.2)
Chloride: 104 mmol/L (ref 96–106)
Creatinine, Ser: 1.04 mg/dL — ABNORMAL HIGH (ref 0.57–1.00)
GFR calc Af Amer: 71 mL/min/{1.73_m2} (ref >59)
GFR calc non Af Amer: 62 mL/min/{1.73_m2} (ref >59)
Glucose: 139 mg/dL — ABNORMAL HIGH (ref 65–99)
Potassium: 4.9 mmol/L (ref 3.5–5.2)
Sodium: 140 mmol/L (ref 134–144)

## 2020-04-25 MED ORDER — TRESIBA FLEXTOUCH 200 UNIT/ML ~~LOC~~ SOPN: 75.0000 [IU] | PEN_INJECTOR | Freq: Every day | SUBCUTANEOUS | 2 refills | Status: DC

## 2020-04-25 MED ORDER — OZEMPIC (1 MG/DOSE) 4 MG/3ML ~~LOC~~ SOPN: 1.0000 mg | PEN_INJECTOR | SUBCUTANEOUS | 0 refills | Status: DC

## 2020-04-25 NOTE — Assessment & Plan Note (Addendum)
Patient hospitalized on 03/21/2020 and discharged on 12/19 for left MCA strokein the setting of hypotension on multiple diuretics. Patients states she continues to have some residual right upper extremity weakness that is slowly improving now able to write again. No longer having balance issues. Has been taking aspirin and plavix and followed with Dr. Leonie Man. Patient had hypercoagulable studies while admitted and noted to have elevated LA  And will need repeat labs in 12 weeks from admission. Will need to continue on aspirin and Plavix for 3 months and controlling hypertension, diabetes, and hyperlipidemia.   Plan Continue on aspirin and plavix for 2 more months Restart losartan and acetazolamide Repeat LA around 06/15/2020 Follow up with neurology

## 2020-04-25 NOTE — Assessment & Plan Note (Signed)
Patient states she was started on atorvastatin 20 mg daily during hospitalization. States she has been on pravastatin in the past with some myalgias while taking it. Currently stats she has mild muscle aches of her legs on the atorvastatin.  Plan Continue atorvastatin 20 mg daily Lipid panel today with LDL of 74, may consider switching her to rosuvastatin with goal LDL<70 and to avoid muscle symptoms

## 2020-04-25 NOTE — Assessment & Plan Note (Addendum)
Patient previously on losartan 100 mg daily and HCTZ 25 mg daily. Was experiencing dizziness, fatigue increased urination on this after place on acetazolamide for her glaucoma. Stopped on BP medication during hospitalization for stroke on 03/21/2020. Has not been on any BP meds since then. Does not check her BP at home. BP in office today elevated to 183/93 and 174/91 on repeat above goal of 140/90 as she has been off BP meds. She denies cp, palpitations, nausea, vomiting, HA, numbness, or tingling. Will restart her losartan today and acetazolamide. Encouraged her to check her BP at home and given blood pressure log.   Plan Restart losartan 50 mg daily and acetazolamide 250 mg 4 times daily BMP today, Cr of 1.04 improved since hospitalization, appears c/w prior baseline Patient will bring BP log to next visit Follow up in 2 weeks for BP recheck

## 2020-04-25 NOTE — Assessment & Plan Note (Signed)
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 Was originally scheduled for surgery for this on 04/05/2020 however patient unable to afford medical insurance anymore and surgery was canceled due to this. Currently on brimonidine, dorzolamide-timolol, and latanoprost. Was on acetazolamide 250 mg four times daily for this but was stopped during hospitalization between 12/17/202 and 03/23/2020 for stroke due to concern that hypotension contributed to this. Has not been on her acetazolamide since this. States she has very little vision on the left and blurred vision on the right. Has not had follow with opthalmology since surgery was cancelled, appears officue has tried to reach out to her for follow up and pressure recheck. Will place her back on acetazolamide and she will need to follow up with her ophthalmologist.   Plan Restart acetazolamide 500 mg four times daily. Follow up with ophthalmology

## 2020-04-25 NOTE — Assessment & Plan Note (Signed)
Patient currently on tresiba 80 units nightly, ozempic meformin 1000 mg twice daily, and fiasp 18 units with meals for glucose >130. Recent A1c of 6.3 and appears improved from prior A1c of 11. States blood glucose has been lower since resent hospitalization for stroke. Notes episodes of dizziness and fatigue in morning with blood sugars in the 70s. She checkers CBG about twice a day and uses her fiasp about once a day. Also follow with podiatry for right charcot joint and recently fitted for diabetic shoes, states she does not wear them often, encouraged she continue with wearing the shoes. Reviewed data from her glucose monitor appears she is having sever eipsoded with glucose in the 70s in the mornings and intermittently during day, and also has highs in the 300s. Appears she is not checking sugars before meals and likely that she is not getting enough mealtime insulin. Will decrease her tresiba as she continue to have symptoms with glucose in 70s.  Discussed as she is on mealtime insulin that she should check blood sugars 4 times a day once in the morning before meals and then 30 minutes before each meal.   Plan Decrease to tresiba 75 units daily, continue ozempic, fiasp, and meformin Refilled tesiba and ozempic Encouraged to check glucose regularly to more accurately dose her short acting insulin

## 2020-04-28 NOTE — Progress Notes (Signed)
Internal Medicine Clinic Attending ? ?Case discussed with Dr. Liang  At the time of the visit.  We reviewed the resident?s history and exam and pertinent patient test results.  I agree with the assessment, diagnosis, and plan of care documented in the resident?s note. ? ?

## 2020-04-30 ENCOUNTER — Telehealth: Payer: Self-pay

## 2020-04-30 NOTE — Telephone Encounter (Signed)
Received TC from [place] w/ [facility].  She is looking for a physician to sign Home health/PT orders for this patient.  States PT started 03/27/20 and stopped 04/10/20.  Per chart review, patient established care with [facility] on 04/24/20 and there are no documented visits before this date.  Informed Alvis Lemmings we could not sign orders for this patient before she established care.  Eliezer Lofts states PT orders were from hospital admission in December, discharge date of 03/21/20.  Per chart review, [facility] attendings/residents were not part of the treatment team during patient's in-patient stay.  Informed Eliezer Lofts [facility] would not be able to sign orders before patient was seen by an [facility] physician.  Eliezer Lofts states patient's previous PCP is refusing to sign PT orders since patient has now transferred care. Will send to Dr. Lisabeth Devoid to see if she agrees. Thank you, SChaplin, RN,BSN

## 2020-05-01 NOTE — Telephone Encounter (Signed)
Yes I can help with this. Please let me know what I need to do.

## 2020-05-01 NOTE — Telephone Encounter (Signed)
I called Jenna with [employer] back.  She will fax over the 485 form which will outline what was done for the patient.  She said there were only 4 visits done because the patient had only mild limitations, she believes they worked on Theme park manager and OT.  She was very appreciative of your help.  She did state that although Dr. Lisabeth Devoid is [place] enrolled, she believes the 485 form may require a co-signature from an attending.  I gave her Dr. Doristine Section name for a co-signature.  Dr. Lisabeth Devoid, there is nothing you need to do until we receive the 485 form.  Thank you, SChaplin, RN,BSN

## 2020-05-08 ENCOUNTER — Ambulatory Visit (INDEPENDENT_AMBULATORY_CARE_PROVIDER_SITE_OTHER): Payer: Self-pay | Admitting: Internal Medicine

## 2020-05-08 ENCOUNTER — Other Ambulatory Visit: Payer: Self-pay

## 2020-05-08 ENCOUNTER — Ambulatory Visit: Payer: Medicaid Other

## 2020-05-08 ENCOUNTER — Encounter: Payer: Self-pay | Admitting: Internal Medicine

## 2020-05-08 VITALS — BP 142/82 | HR 112 | Temp 98.2°F | Ht 66.5 in | Wt 245.9 lb

## 2020-05-08 DIAGNOSIS — R000 Tachycardia, unspecified: Secondary | ICD-10-CM

## 2020-05-08 DIAGNOSIS — H409 Unspecified glaucoma: Secondary | ICD-10-CM

## 2020-05-08 DIAGNOSIS — I10 Essential (primary) hypertension: Secondary | ICD-10-CM

## 2020-05-08 DIAGNOSIS — Z7984 Long term (current) use of oral hypoglycemic drugs: Secondary | ICD-10-CM

## 2020-05-08 DIAGNOSIS — E119 Type 2 diabetes mellitus without complications: Secondary | ICD-10-CM

## 2020-05-08 DIAGNOSIS — Z794 Long term (current) use of insulin: Secondary | ICD-10-CM

## 2020-05-08 MED ORDER — LOSARTAN POTASSIUM 50 MG PO TABS: 50.0000 mg | ORAL_TABLET | Freq: Two times a day (BID) | ORAL | 11 refills | Status: DC

## 2020-05-08 NOTE — Patient Instructions (Addendum)
Thank you for allowing [place] to provide your care today.   Per your last note from your eye doctor you can decrease acetazolamide dose to 3 times a day instead of 4 times a day if you have side effect.  But please discuss it more with your eye doctor. Please take losartan 50 mg twice a day (instead of once a day) to have a better control of your blood pressure. Please take rest of your medications as before. I ordered blood work for you to be done today or in 2 weeks, when you come for follow-up. Come back to clinic in 2 weeks for blood pressure check and blood work or earlier if needed.  As always, if having severe symptoms, please seek medical attention at emergency room. Should you have any questions or concerns please call the internal medicine clinic at [phone].    Thank you!

## 2020-05-08 NOTE — Progress Notes (Signed)
Established Patient Office Visit  Subjective:  Patient ID: Sharon Blankenship, female    DOB: 26-Dec-1965  Age: 55 y.o. MRN: [id number]  CC:  Chief Complaint  Patient presents with  . Follow-up     HPI Sharon Blankenship is a 55 y/o female w PMHx significant for DM, HTN, CVA, Glaucoma who presents for HTN and DM follow up. Please refer to problem based charting for further details and assessment and plan.   Past Medical History:  Diagnosis Date  . Diabetes mellitus without complication ([place])   . Hypertension     Past Surgical History:  Procedure Laterality Date  . ABDOMINAL HYSTERECTOMY     2016    History reviewed. No pertinent family history.  Social History   Socioeconomic History  . Marital status: Single    Spouse name: Not on file  . Number of children: Not on file  . Years of education: Not on file  . Highest education level: Not on file  Occupational History  . Not on file  Tobacco Use  . Smoking status: Never Smoker  . Smokeless tobacco: Never Used  Substance and Sexual Activity  . Alcohol use: Never  . Drug use: Never  . Sexual activity: Not on file  Other Topics Concern  . Not on file  Social History Narrative   Lives in [place] with roommate. Sister lives close by. 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 acetaZOLAMIDE (DIAMOX) 125 MG tablet Take 2 tablets (250 mg total) by mouth in the morning, at noon, in the evening, and at bedtime. 240 tablet 3  . aspirin EC 81 MG EC tablet Take 1 tablet (81 mg total) by mouth daily. Swallow whole. 30 tablet 11  . atorvastatin  (LIPITOR) 20 MG tablet Take 20 mg by mouth daily.    Marland Kitchen azelastine (ASTELIN) 0.1 % nasal spray Place 1 spray into both nostrils daily as needed for allergies.    . brimonidine (ALPHAGAN) 0.2 % ophthalmic solution Place 1 drop into both eyes 3 (three) times daily.    . clopidogrel (PLAVIX) 75 MG tablet Take 1 tablet (75 mg total) by mouth daily. 90 tablet 0  . dorzolamide-timolol (COSOPT) 22.3-6.8 MG/ML ophthalmic solution Place 1 drop into the right eye 2 (two) times daily.    . ferrous sulfate 325 (65 FE) MG tablet Take 325 mg by mouth daily with breakfast.    . FIASP FLEXTOUCH 100 UNIT/ML FlexTouch Pen Inject 18 Units into the skin daily as needed (blood sugar).    . gabapentin (NEURONTIN) 100 MG capsule Take 100 mg by mouth 3 (three) times daily as needed (nerve pain).    Marland Kitchen latanoprost (XALATAN) 0.005 % ophthalmic solution Place 1 drop into both eyes at bedtime.    . meloxicam (MOBIC) 15 MG tablet Take 15 mg by mouth daily as needed for pain.    . metFORMIN (GLUCOPHAGE) 1000 MG tablet Take 1,000 mg by mouth 2 (two) times daily with a meal.    . methocarbamol (ROBAXIN) 500 MG tablet Take 500 mg by mouth 3 (three) times daily.    Marland Kitchen  montelukast (SINGULAIR) 10 MG tablet Take 1 tablet (10 mg total) by mouth daily. 30 tablet 2  . OZEMPIC, 1 MG/DOSE, 4 MG/3ML SOPN Inject 1 mg into the skin once a week. On Wednesday 9 mL 0  . polyethylene glycol (MIRALAX / GLYCOLAX) packet Take 17 g by mouth daily as needed for mild constipation or moderate constipation.    Tyler Aas FLEXTOUCH 200 UNIT/ML FlexTouch Pen Inject 76 Units into the skin daily. 18 mL 2  . losartan (COZAAR) 50 MG tablet Take 1 tablet (50 mg total) by mouth daily. 30 tablet 11   No facility-administered medications prior to visit.    Allergies  Allergen Reactions  . Aloe Shortness Of Breath and Rash    SOB, Swelling, Rash, Itching  . Penicillins Hives, Itching and Swelling    Did it involve swelling of the face/tongue/throat, SOB, or low  BP? Y Did it involve sudden or severe rash/hives, skin peeling, or any reaction on the inside of your mouth or nose? N Did you need to seek medical attention at a hospital or doctor's office? Y When did it last happen? Several Years Ago If all above answers are "NO", may proceed with cephalosporin use.       Review of Systems  ROS is negative except as mentioned in HPI and assessment and plan.    Objective:    Physical Exam Constitutional:      General: She is not in acute distress.    Appearance: Normal appearance. She is not ill-appearing.  Cardiovascular:     Rate and Rhythm: Regular rhythm. Tachycardia present.     Pulses: Normal pulses.     Heart sounds: Normal heart sounds. No murmur heard.   Abdominal:     Palpations: Abdomen is soft.     Tenderness: There is no abdominal tenderness.  Skin:    General: Skin is warm and dry.  Neurological:     Mental Status: She is alert and oriented to person, place, and time.     BP (!) 142/82 (BP Location: Right Arm, Patient Position: Sitting, Cuff Size: Normal)   Pulse (!) 112   Temp 98.2 F (36.8 C) (Oral)   Ht 5' 6.5" (1.689 m)   Wt 245 lb 14.4 oz (111.5 kg)   LMP 04/24/2014   SpO2 97% Comment: RA  BMI 39.09 kg/m  Wt Readings from Last 3 Encounters:  05/08/20 245 lb 14.4 oz (111.5 kg)  04/24/20 254 lb 11.2 oz (115.5 kg)  03/22/20 245 lb 2.4 oz (111.2 kg)   Physical Exam Constitutional:      General: She is not in acute distress.    Appearance: Normal appearance. She is not ill-appearing.  Cardiovascular:     Rate and Rhythm: Regular rhythm. Tachycardia present.     Pulses: Normal pulses.     Heart sounds: Normal heart sounds. No murmur heard.   Abdominal:     Palpations: Abdomen is soft.     Tenderness: There is no abdominal tenderness.  Skin:    General: Skin is warm and dry.  Neurological:     Mental Status: She is alert and oriented to person, place, and time.      Health Maintenance Due   Topic Date Due  . Hepatitis C Screening  Never done  . PNEUMOCOCCAL POLYSACCHARIDE VACCINE AGE 21-64 HIGH RISK  Never done  . OPHTHALMOLOGY EXAM  Never done  . TETANUS/TDAP  Never done  . PAP SMEAR-Modifier  Never done  . COLONOSCOPY (Pts  45-18yrs Insurance coverage will need to be confirmed)  Never done  . MAMMOGRAM  Never done  . INFLUENZA VACCINE  11/04/2019  . COVID-19 Vaccine (3 - Booster for Pfizer series) 01/13/2020    There are no preventive care reminders to display for this patient.  No results found for: TSH Lab Results  Component Value Date   WBC 5.4 03/23/2020   HGB 9.8 (L) 03/23/2020   HCT 30.0 (L) 03/23/2020   MCV 80.6 03/23/2020   PLT 255 03/23/2020   Lab Results  Component Value Date   NA 140 04/24/2020   K 4.9 04/24/2020   CO2 21 04/24/2020   GLUCOSE 139 (H) 04/24/2020   BUN 16 04/24/2020   CREATININE 1.04 (H) 04/24/2020   BILITOT 0.9 03/21/2020   ALKPHOS 136 (H) 03/21/2020   AST 29 03/21/2020   ALT 61 (H) 03/21/2020   PROT 7.6 03/21/2020   ALBUMIN 3.7 03/21/2020   CALCIUM 9.2 04/24/2020   ANIONGAP 12 03/23/2020   Lab Results  Component Value Date   CHOL 133 04/24/2020   Lab Results  Component Value Date   HDL 39 (L) 04/24/2020   Lab Results  Component Value Date   LDLCALC 74 04/24/2020   Lab Results  Component Value Date   TRIG 105 04/24/2020   Lab Results  Component Value Date   CHOLHDL 3.4 04/24/2020   Lab Results  Component Value Date   HGBA1C 6.3 (H) 03/22/2020      Assessment & Plan:   Problem List Items Addressed This Visit      Cardiovascular and Mediastinum   Hypertension - Primary    BP is mildly elevated at 142/82 today.  -Increasing Losartan to 50 mg BID for better BP control with recent stroke. -Ordering BMP is she has been on high dose of acetazolamide for her glaucoma>Pt will do it next visit in 2 weeks per patient's preferance as she is uninsured now        Relevant Medications   losartan (COZAAR) 50  MG tablet   Other Relevant Orders   BMP8+Anion Gap     Other   Sinus tachycardia    Patient has sinus tachycardia HR 115 at today visit. She states that she has been anxious recently and after her recent stroke and specifically today before arriving to the clinic because she had a stressful phone call. She is otherwise asymptomatic and no palpitations, dizziness, chest pain, sob. BP is OK at 142/82. No rhythm irregularity in heart exam.  No evidence of bleeding as cause of tachycardia.  Beside anxiety she is also on  high dose of acetazolamide that resumed recently and increased diuresis might have contributed to her sinus tachycardia.   -No further management or work up today but instructed to let us know if she experienced palpitation or other concerning symptoms at home.         Meds ordered this encounter  Medications  . losartan (COZAAR) 50 MG tablet    Sig: Take 1 tablet (50 mg total) by mouth 2 (two) times daily.    Dispense:  60 tablet    Refill:  11    IM program    Follow-up: Return in about 2 weeks (around 05/22/2020) for BMP and HTN .    Dewayne Hatch, MD

## 2020-05-10 ENCOUNTER — Encounter: Payer: Self-pay | Admitting: Internal Medicine

## 2020-05-10 DIAGNOSIS — R000 Tachycardia, unspecified: Secondary | ICD-10-CM | POA: Insufficient documentation

## 2020-05-10 NOTE — Assessment & Plan Note (Signed)
CBG from home glucometer data within acceptable range. Will continue current regimen.

## 2020-05-10 NOTE — Assessment & Plan Note (Signed)
Patient has sinus tachycardia HR 115 at today visit. She states that she has been anxious recently and after her recent stroke and specifically today before arriving to the clinic because she had a stressful phone call. She is otherwise asymptomatic and no palpitations, dizziness, chest pain, sob. BP is OK at 142/82. No rhythm irregularity in heart exam.  No evidence of bleeding as cause of tachycardia.  Beside anxiety she is also on  high dose of acetazolamide that resumed recently and increased diuresis might have contributed to her sinus tachycardia.   -No further management or work up today but instructed to let us know if she experienced palpitation or other concerning symptoms at home.

## 2020-05-10 NOTE — Assessment & Plan Note (Signed)
Glaucoma: Patient reports side effect such as low energy and skin sensitivity after Acetazolamide resumed last visit. (Mentioned 500 4 times a day in previous visit but I think it was a typo and she was instructed to take 250 mg QID as before) She is on 250 mg 4 times a day resumed last visit. Per last ophthalmology note 05/02/2020, she can decrease the dose to 250 TID (instead of 4 times a day) in case of side effect. Recommended patient to do so and to discuss her symptoms with ophthalmology for further recommendations.   -Acetazolamide 250 mg TID and follow up with ophthalmology -BMP next visit

## 2020-05-10 NOTE — Assessment & Plan Note (Addendum)
BP is mildly elevated at 142/82 today.  -Increasing Losartan to 50 mg BID for better BP control with recent stroke. -Ordering BMP is she has been on high dose of acetazolamide for her glaucoma>Pt will do it next visit in 2 weeks per patient's preferance as she is uninsured now

## 2020-05-12 NOTE — Progress Notes (Signed)
Internal Medicine Clinic Attending  Case discussed with Dr. Masoudi  At the time of the visit.  We reviewed the resident's history and exam and pertinent patient test results.  I agree with the assessment, diagnosis, and plan of care documented in the resident's note.  

## 2020-06-05 ENCOUNTER — Other Ambulatory Visit: Payer: Self-pay

## 2020-06-05 ENCOUNTER — Ambulatory Visit (INDEPENDENT_AMBULATORY_CARE_PROVIDER_SITE_OTHER): Payer: Self-pay | Admitting: Internal Medicine

## 2020-06-05 VITALS — BP 126/77 | HR 115 | Temp 98.6°F

## 2020-06-05 DIAGNOSIS — E872 Acidosis: Secondary | ICD-10-CM

## 2020-06-05 DIAGNOSIS — K219 Gastro-esophageal reflux disease without esophagitis: Secondary | ICD-10-CM

## 2020-06-05 DIAGNOSIS — H4010X3 Unspecified open-angle glaucoma, severe stage: Secondary | ICD-10-CM

## 2020-06-05 DIAGNOSIS — E119 Type 2 diabetes mellitus without complications: Secondary | ICD-10-CM

## 2020-06-05 DIAGNOSIS — Z0000 Encounter for general adult medical examination without abnormal findings: Secondary | ICD-10-CM

## 2020-06-05 DIAGNOSIS — IMO0002 Reserved for concepts with insufficient information to code with codable children: Secondary | ICD-10-CM

## 2020-06-05 DIAGNOSIS — I63512 Cerebral infarction due to unspecified occlusion or stenosis of left middle cerebral artery: Secondary | ICD-10-CM

## 2020-06-05 DIAGNOSIS — R000 Tachycardia, unspecified: Secondary | ICD-10-CM

## 2020-06-05 DIAGNOSIS — E118 Type 2 diabetes mellitus with unspecified complications: Secondary | ICD-10-CM

## 2020-06-05 DIAGNOSIS — E1165 Type 2 diabetes mellitus with hyperglycemia: Secondary | ICD-10-CM

## 2020-06-05 DIAGNOSIS — I10 Essential (primary) hypertension: Secondary | ICD-10-CM

## 2020-06-05 DIAGNOSIS — H4053X3 Glaucoma secondary to other eye disorders, bilateral, severe stage: Secondary | ICD-10-CM

## 2020-06-05 DIAGNOSIS — E8729 Other acidosis: Secondary | ICD-10-CM

## 2020-06-05 DIAGNOSIS — Z794 Long term (current) use of insulin: Secondary | ICD-10-CM

## 2020-06-05 LAB — POCT GLYCOSYLATED HEMOGLOBIN (HGB A1C): Hemoglobin A1C: 7 % — AB (ref 4.0–5.6)

## 2020-06-05 LAB — GLUCOSE, CAPILLARY: Glucose-Capillary: 126 mg/dL — ABNORMAL HIGH (ref 70–99)

## 2020-06-05 MED ORDER — PANTOPRAZOLE SODIUM 40 MG PO TBEC
40.0000 mg | DELAYED_RELEASE_TABLET | Freq: Every day | ORAL | 0 refills | Status: DC
Start: 2020-06-05 — End: 2020-07-15

## 2020-06-06 ENCOUNTER — Other Ambulatory Visit: Payer: Self-pay | Admitting: *Deleted

## 2020-06-06 DIAGNOSIS — Z0000 Encounter for general adult medical examination without abnormal findings: Secondary | ICD-10-CM | POA: Insufficient documentation

## 2020-06-06 DIAGNOSIS — E8729 Other acidosis: Secondary | ICD-10-CM | POA: Insufficient documentation

## 2020-06-06 DIAGNOSIS — E872 Acidosis: Secondary | ICD-10-CM | POA: Insufficient documentation

## 2020-06-06 DIAGNOSIS — I10 Essential (primary) hypertension: Secondary | ICD-10-CM

## 2020-06-06 DIAGNOSIS — K219 Gastro-esophageal reflux disease without esophagitis: Secondary | ICD-10-CM | POA: Insufficient documentation

## 2020-06-06 LAB — CMP14 + ANION GAP
ALT: 13 IU/L (ref 0–32)
AST: 12 IU/L (ref 0–40)
Albumin/Globulin Ratio: 1.6 (ref 1.2–2.2)
Albumin: 4.6 g/dL (ref 3.8–4.9)
Alkaline Phosphatase: 183 IU/L — ABNORMAL HIGH (ref 44–121)
Anion Gap: 18 mmol/L (ref 10.0–18.0)
BUN/Creatinine Ratio: 15 (ref 9–23)
BUN: 19 mg/dL (ref 6–24)
Bilirubin Total: 0.3 mg/dL (ref 0.0–1.2)
CO2: 14 mmol/L — ABNORMAL LOW (ref 20–29)
Calcium: 9.6 mg/dL (ref 8.7–10.2)
Chloride: 109 mmol/L — ABNORMAL HIGH (ref 96–106)
Creatinine, Ser: 1.28 mg/dL — ABNORMAL HIGH (ref 0.57–1.00)
Globulin, Total: 2.8 g/dL (ref 1.5–4.5)
Glucose: 119 mg/dL — ABNORMAL HIGH (ref 65–99)
Potassium: 4.5 mmol/L (ref 3.5–5.2)
Sodium: 141 mmol/L (ref 134–144)
Total Protein: 7.4 g/dL (ref 6.0–8.5)
eGFR: 50 mL/min/{1.73_m2} — ABNORMAL LOW (ref >59)

## 2020-06-06 LAB — CBC WITH DIFFERENTIAL/PLATELET
Basophils Absolute: 0 10*3/uL (ref 0.0–0.2)
Basos: 1 %
EOS (ABSOLUTE): 0.1 10*3/uL (ref 0.0–0.4)
Eos: 1 %
Hematocrit: 35.8 % (ref 34.0–46.6)
Hemoglobin: 11.7 g/dL (ref 11.1–15.9)
Immature Grans (Abs): 0 10*3/uL (ref 0.0–0.1)
Immature Granulocytes: 0 %
Lymphocytes Absolute: 2.1 10*3/uL (ref 0.7–3.1)
Lymphs: 24 %
MCH: 26.8 pg (ref 26.6–33.0)
MCHC: 32.7 g/dL (ref 31.5–35.7)
MCV: 82 fL (ref 79–97)
Monocytes Absolute: 0.6 10*3/uL (ref 0.1–0.9)
Monocytes: 6 %
Neutrophils Absolute: 5.9 10*3/uL (ref 1.4–7.0)
Neutrophils: 68 %
Platelets: 332 10*3/uL (ref 150–450)
RBC: 4.37 x10E6/uL (ref 3.77–5.28)
RDW: 14.4 % (ref 11.7–15.4)
WBC: 8.7 10*3/uL (ref 3.4–10.8)

## 2020-06-06 MED ORDER — OZEMPIC (1 MG/DOSE) 4 MG/3ML ~~LOC~~ SOPN: 1.0000 mg | PEN_INJECTOR | SUBCUTANEOUS | 0 refills | Status: DC

## 2020-06-06 MED ORDER — LOSARTAN POTASSIUM 100 MG PO TABS: 100.0000 mg | ORAL_TABLET | Freq: Every day | ORAL | 3 refills | Status: DC

## 2020-06-06 MED ORDER — GABAPENTIN 100 MG PO CAPS: 100.0000 mg | ORAL_CAPSULE | Freq: Three times a day (TID) | ORAL | 3 refills | Status: DC | PRN

## 2020-06-06 MED ORDER — ATORVASTATIN CALCIUM 20 MG PO TABS
20.0000 mg | ORAL_TABLET | Freq: Every day | ORAL | 3 refills | Status: DC
Start: 2020-06-06 — End: 2020-07-30

## 2020-06-06 MED ORDER — ASPIRIN 81 MG PO TBEC
81.0000 mg | DELAYED_RELEASE_TABLET | Freq: Every day | ORAL | 11 refills | Status: AC
Start: 2020-06-06 — End: ?

## 2020-06-06 MED ORDER — ACETAZOLAMIDE 125 MG PO TABS
250.0000 mg | ORAL_TABLET | Freq: Four times a day (QID) | ORAL | 1 refills | Status: AC
Start: 2020-06-06 — End: 2020-10-04

## 2020-06-06 MED ORDER — METFORMIN HCL 1000 MG PO TABS: 1000.0000 mg | ORAL_TABLET | Freq: Two times a day (BID) | ORAL | 2 refills | Status: DC

## 2020-06-06 MED ORDER — TRESIBA FLEXTOUCH 200 UNIT/ML ~~LOC~~ SOPN: 75.0000 [IU] | PEN_INJECTOR | Freq: Every day | SUBCUTANEOUS | 2 refills | Status: DC

## 2020-06-06 NOTE — Assessment & Plan Note (Signed)
Will complete 3 months of DAPT on 3/17. Plavix will be discontinued at that time. She will remain on aspirin 81mg  at that time. She has not followed up with neurology as instructed at discharge. Encouraged her to call to arrange an appointment with them.

## 2020-06-06 NOTE — Assessment & Plan Note (Signed)
She notes a long history of gastric reflux however has never received treatment. Alarm features including new onset dyspepsia in patient ?60 years, evidence of gastrointestinal bleeding (hematemesis, melena, hematochezia, occult blood in stool), iron deficiency anemia, anorexia, unexplained weight loss, dysphagia, odynophagia, or persistent vomiting absent so immediate GI referral not indicated at this time. No previous endoscopy studies.  Plan:  -start protonix 40mg  daily for 2 months. Will have her arranage an appointment for re-evaluation at that time to assess for symptomatic improvement. If no improvement, would consider referral to GI at that time for endoscopy

## 2020-06-06 NOTE — Telephone Encounter (Signed)
Pt called / stated someone from our office had just called her; I told her I'm not sure who had called her ( no note in chart). Then she stated she went to the [facility] for her meds but was told they need rxs from her doctor in order to fill her meds. Informed pt rxs were sent this afternoon; they probably had not received them yet when she went; stated she will check with them on Monday. Losartan was filled "No Print" - needs to be re- sent electronically. Thanks

## 2020-06-06 NOTE — Assessment & Plan Note (Addendum)
Current medications: tresiba 76U daily, ozempic 1mg  weekly injection, metformin 1000mg  BID, gabapentin 100mg  TID  She has had difficulty with access to medications due to financial limitations. Denies hypoglycemia Last A1C in December was 6.3 A1C today is 7 Follows with [facility] ophthalmology  Plan -Will send her current medications to [facility] to hopefully get her back on track -no medication changes for today -f/u 3 months

## 2020-06-06 NOTE — Assessment & Plan Note (Signed)
Remains tachycardic at today's visit. She notes that this has been going on for several years. Echocardiogram from her recent admission in December did not reveal any valvular abnormalities.  She endorses intermittent palpitations that are unchanged over the past several years. She will need a referral for cardiac monitoring at some point however this appears like a stable issue and was unable to be fully addressed at today's visit due to time limiting factors.  Plan -will refer her to cardiology at her next office. Unfortunately, I am concerned that this may be challenging with her lack of insurance

## 2020-06-06 NOTE — Assessment & Plan Note (Addendum)
She has several healthcare maintance issues due.  She received the information on free breast cancer screening program at today's OV. Will need to continue to work on the other screenings and vaccines at a future visit She will need to follow up with her PCP at her next availability.

## 2020-06-06 NOTE — Assessment & Plan Note (Addendum)
This was the main topic of her visit today. She has been following at [facility]. Her last OV was 1/28.  She would prefer to go to a provider closer to [place] as her vision impairment precludes her ability to drive. She also expresses frustration with aspects of the [place] clinic that are typical in a residency clinic such as having multiple providers and less stability in the schedule.  Based on chart review, they determined that she will require surgery for the glaucoma. Unfortunately, she has financial barriers to having this preformed.  She was placed on acetazolamide as a bridge therapy to get her to surgery.  I emphasized the importance of her following up with ophthalmology. Unfortunately, there is not much I am to do regarding the financial aspect. I am hopeful that there may be some type of charity program through the [facility] program to assist her with the cost.  Plan -will obtain a CMP to monitor acid base status, electrolytes and LFTs while being on acetazolamide -continue acetazolamide, brimonidine, cosopt for glaucoma -f/u with with ophthalmology  Addendum #1 06/06/20 : CMP shows a worsening AGMA which is likely due to the acetazolamide. CBC, obtained to monitor for aplastic anemia associated with acetazolamide, was unremarkable.  I am concerned that she will not follow up with [facility] ophthalmology and that her [place] will continue to worsen if she stays on the acetazolamide. Unfortunately, the consequences of her stopping the acetazolamide, including blindness, would also be devastating. Due to her financial limitations and needing [place] to prescribe her medications with her orange card, this places [place] in a difficult position. We will continue to encourage her to follow up with the ophthalmology clinic and continue to monitor her labs closely. I have attempted to contact the patient to relay lab results and reiterate the importance of optho follow up and coming back for  labs. Unfortunately, there was no answer and could not verify identity on VM. Will send a message to our nurse team to attempt to contact the patient again.

## 2020-06-06 NOTE — Assessment & Plan Note (Addendum)
Noted on today's labs. AG 18 which is up from 15 on labs from 1 month ago. Bicarb is down to 14 from 21. Suspect this is in the setting of acetazolamide.  -will repeat labs in 4w

## 2020-06-06 NOTE — Telephone Encounter (Signed)
Will resend losartan.

## 2020-06-06 NOTE — Assessment & Plan Note (Signed)
Current medications: losartan 100mg  daily. On acetazolamide 250mg  four times daily for glaucoma Blood pressure is at goal in the office today Plan -CMP today -continue current management.  -f/u 3 mo

## 2020-06-06 NOTE — Progress Notes (Signed)
Office Visit   Patient ID: Sharon Blankenship, female    DOB: [birth year], 55 y.o.   MRN: [id number]  Subjective:  CC: Hypertension, diabetes, glaucoma  HPI 55 y.o. presents today for follow up of her chronic medical conditions. Please refer to problem based charting for details, assessment and plan      ACTIVE MEDICATIONS   Outpatient Medications Prior to Visit  Medication Sig Dispense Refill  . acetaZOLAMIDE (DIAMOX) 125 MG tablet Take 2 tablets (250 mg total) by mouth in the morning, at noon, in the evening, and at bedtime. 240 tablet 3  . aspirin EC 81 MG EC tablet Take 1 tablet (81 mg total) by mouth daily. Swallow whole. 30 tablet 11  . atorvastatin (LIPITOR) 20 MG tablet Take 20 mg by mouth daily.    Marland Kitchen azelastine (ASTELIN) 0.1 % nasal spray Place 1 spray into both nostrils daily as needed for allergies.    . brimonidine (ALPHAGAN) 0.2 % ophthalmic solution Place 1 drop into both eyes 3 (three) times daily.    . clopidogrel (PLAVIX) 75 MG tablet Take 1 tablet (75 mg total) by mouth daily. 90 tablet 0  . dorzolamide-timolol (COSOPT) 22.3-6.8 MG/ML ophthalmic solution Place 1 drop into the right eye 2 (two) times daily.    . ferrous sulfate 325 (65 FE) MG tablet Take 325 mg by mouth daily with breakfast.    . FIASP FLEXTOUCH 100 UNIT/ML FlexTouch Pen Inject 18 Units into the skin daily as needed (blood sugar).    . gabapentin (NEURONTIN) 100 MG capsule Take 100 mg by mouth 3 (three) times daily as needed (nerve pain).    Marland Kitchen latanoprost (XALATAN) 0.005 % ophthalmic solution Place 1 drop into both eyes at bedtime.    Marland Kitchen losartan (COZAAR) 50 MG tablet Take 1 tablet (50 mg total) by mouth 2 (two) times daily. 60 tablet 11  . meloxicam (MOBIC) 15 MG tablet Take 15 mg by mouth daily as needed for pain.    . metFORMIN (GLUCOPHAGE) 1000 MG tablet Take 1,000 mg by mouth 2 (two) times daily with a meal.    . methocarbamol (ROBAXIN) 500 MG tablet Take 500 mg by mouth 3 (three) times daily.    .  montelukast (SINGULAIR) 10 MG tablet Take 1 tablet (10 mg total) by mouth daily. 30 tablet 2  . OZEMPIC, 1 MG/DOSE, 4 MG/3ML SOPN Inject 1 mg into the skin once a week. On Wednesday 9 mL 0  . polyethylene glycol (MIRALAX / GLYCOLAX) packet Take 17 g by mouth daily as needed for mild constipation or moderate constipation.    Tyler Aas FLEXTOUCH 200 UNIT/ML FlexTouch Pen Inject 76 Units into the skin daily. 18 mL 2   No facility-administered medications prior to visit.     ROS  Review of Systems  Constitutional: Negative for chills and fever.  Eyes: Positive for visual disturbance. Negative for pain and discharge.  Respiratory: Negative for shortness of breath.   Cardiovascular: Positive for palpitations. Negative for chest pain.  Gastrointestinal: Negative for abdominal pain.  Neurological: Negative for dizziness and light-headedness.    Objective:   BP 126/77 (BP Location: Left Arm, Patient Position: Sitting, Cuff Size: Normal)   Pulse (!) 115   Temp 98.6 F (37 C) (Oral)   LMP 04/24/2014   SpO2 100%  Wt Readings from Last 3 Encounters:  05/08/20 245 lb 14.4 oz (111.5 kg)  04/24/20 254 lb 11.2 oz (115.5 kg)  03/22/20 245 lb 2.4 oz (111.2 kg)  BP Readings from Last 3 Encounters:  06/05/20 126/77  05/08/20 (!) 142/82  04/24/20 (!) 174/91   Physical Exam Constitutional:      Appearance: She is obese.  Eyes:     Extraocular Movements: Extraocular movements intact.     Conjunctiva/sclera: Conjunctivae normal.  Cardiovascular:     Rate and Rhythm: Regular rhythm. Tachycardia present.  Pulmonary:     Effort: Pulmonary effort is normal.     Breath sounds: Normal breath sounds.  Neurological:     General: No focal deficit present.     Mental Status: She is alert.     Health Maintenance:   Health Maintenance  Topic Date Due  . Hepatitis C Screening  Never done  . PNEUMOCOCCAL POLYSACCHARIDE VACCINE AGE 61-64 HIGH RISK  Never done  . OPHTHALMOLOGY EXAM  Never done  .  TETANUS/TDAP  Never done  . PAP SMEAR-Modifier  Never done  . COLONOSCOPY (Pts 45-75yrs Insurance coverage will need to be confirmed)  Never done  . MAMMOGRAM  Never done  . INFLUENZA VACCINE  11/04/2019  . COVID-19 Vaccine (3 - Booster for Pfizer series) 01/13/2020  . FOOT EXAM  06/05/2021 (Originally 08/20/1975)  . HEMOGLOBIN A1C  12/06/2020  . HIV Screening  Completed  . HPV VACCINES  Aged Out     Assessment & Plan:   Problem List Items Addressed This Visit      Cardiovascular and Mediastinum   Hypertension (Chronic)    Current medications: losartan 100mg  daily. On acetazolamide 250mg  four times daily for glaucoma Blood pressure is at goal in the office today Plan -CMP today -continue current management.  -f/u 3 mo      CVA (cerebral vascular accident) ([place])    Will complete 3 months of DAPT on 3/17. Plavix will be discontinued at that time. She will remain on aspirin 81mg  at that time. She has not followed up with neurology as instructed at discharge. Encouraged her to call to arrange an appointment with them.        Digestive   GERD (gastroesophageal reflux disease)    She notes a long history of gastric reflux however has never received treatment. Alarm features including new onset dyspepsia in patient ?60 years, evidence of gastrointestinal bleeding (hematemesis, melena, hematochezia, occult blood in stool), iron deficiency anemia, anorexia, unexplained weight loss, dysphagia, odynophagia, or persistent vomiting absent so immediate GI referral not indicated at this time. No previous endoscopy studies.  Plan:  -start protonix 40mg  daily for 2 months. Will have her arranage an appointment for re-evaluation at that time to assess for symptomatic improvement. If no improvement, would consider referral to GI at that time for endoscopy       Relevant Medications   pantoprazole (PROTONIX) 40 MG tablet     Endocrine   DM (diabetes mellitus), type 2, uncontrolled with  complications ([place]) - Primary (Chronic)    Current medications: tresiba 76U daily, ozempic 1mg  weekly injection, metformin 1000mg  BID, gabapentin 100mg  TID  She has had difficulty with access to medications due to financial limitations. Denies hypoglycemia Last A1C in December was 6.3 A1C today is 7 Follows with [facility] ophthalmology  Plan -Will send her current medications to [facility] to hopefully get her back on track -no medication changes for today -f/u 3 months         Other   Secondary open-angle glaucoma of both eyes, severe stage (Chronic)    This was the main topic of her visit today. She has  been following at [facility] residency clinic. Her last OV was 1/28.  She would prefer to go to a provider closer to [place] as her vision impairment precludes her ability to drive. She also expresses frustration with aspects of the [place] clinic that are typical in a residency clinic such as having multiple providers and less stability in the schedule.  Based on chart review, they determined that she will require surgery for the glaucoma. Unfortunately, she has financial barriers to having this preformed.  She was placed on acetazolamide as a bridge therapy to get her to surgery.  I emphasized the importance of her following up with ophthalmology. Unfortunately, there is not much I am to do regarding the financial aspect. I am hopeful that there may be some type of charity program through the [facility] program to assist her with the cost.  Plan -will obtain a CMP to monitor acid base status, electrolytes and LFTs while being on acetazolamide -continue acetazolamide, brimonidine, cosopt for glaucoma -f/u with with opthalmology      Sinus tachycardia (Chronic)    Remains tachycardic at today's visit. She notes that this has been going on for several years. Echocardiogram from her recent admission in December did not reveal any valvular abnormalities.  She endorses  intermittent palpitations that are unchanged over the past several years. She will need a referral for cardiac monitoring at some point however this appears like a stable issue and was unable to be fully addressed at today's visit due to time limiting factors.  Plan -will refer her to cardiology at her next office. Unfortunately, I am concerned that this may be challenging with her lack of insurance      Healthcare maintenance (Chronic)    She has several healthcare maintance issues due.  She received the information on free breast cancer screening program at today's OV. Will need to continue to work on the other screenings and vaccines at a future visit She will need to follow up with her PCP at her next availability.           Pt discussed with Dr. Forde Radon, MD Internal Medicine Resident PGY-2 [facility] Pager: [phone] 06/06/2020 2:40 PM

## 2020-06-09 ENCOUNTER — Other Ambulatory Visit: Payer: Self-pay | Admitting: Dietician

## 2020-06-09 ENCOUNTER — Other Ambulatory Visit: Payer: Self-pay | Admitting: Internal Medicine

## 2020-06-09 ENCOUNTER — Encounter: Payer: Self-pay | Admitting: Dietician

## 2020-06-09 DIAGNOSIS — E1165 Type 2 diabetes mellitus with hyperglycemia: Secondary | ICD-10-CM

## 2020-06-09 DIAGNOSIS — IMO0002 Reserved for concepts with insufficient information to code with codable children: Secondary | ICD-10-CM

## 2020-06-09 MED ORDER — INSULIN PEN NEEDLE 32G X 4 MM MISC: 11 refills | Status: DC

## 2020-06-09 MED FILL — UNIFINE PENTIPS 32GX5/32: 32G X 4 MM | 20 days supply | Qty: 100 | Fill #0

## 2020-06-09 NOTE — Telephone Encounter (Signed)
Dr. Darrick Meigs requested help with ordering diabetes supplies. I called the health department and patient came Friday for her medications. She already got a meter and supplies and they do not need an order. They do not have FIASP on her medication list, it was sent to [facility]. They are unable to provide her with pen needles, They encouraged her to enroll in th MAP programs so she would get her medications and pen needles for free.   Call to [place] who said pt could get pen needles through IM program if [facility] could not supply them.

## 2020-06-11 NOTE — Progress Notes (Signed)
Internal Medicine Clinic Attending  Case discussed with Dr. Christian  At the time of the visit.  We reviewed the resident's history and exam and pertinent patient test results.  I agree with the assessment, diagnosis, and plan of care documented in the resident's note.  

## 2020-06-25 ENCOUNTER — Telehealth: Payer: Self-pay | Admitting: Student

## 2020-06-25 NOTE — Telephone Encounter (Signed)
Pt requesting a letter to be written.  Please  call the patient back.

## 2020-06-25 NOTE — Telephone Encounter (Signed)
Return pt's call  - stated she received a letter from [employer] [facility] doctor) requesting office's note; stated she had never been asked before so she's going to their office for clarity first then bring the letter if she has to.

## 2020-06-30 ENCOUNTER — Ambulatory Visit: Payer: BLUE CROSS/BLUE SHIELD | Admitting: Internal Medicine

## 2020-07-02 ENCOUNTER — Encounter: Payer: Medicaid Other | Admitting: Internal Medicine

## 2020-07-15 ENCOUNTER — Ambulatory Visit (INDEPENDENT_AMBULATORY_CARE_PROVIDER_SITE_OTHER): Payer: Self-pay | Admitting: Student

## 2020-07-15 ENCOUNTER — Other Ambulatory Visit (HOSPITAL_COMMUNITY): Payer: Self-pay

## 2020-07-15 VITALS — BP 132/74 | HR 98 | Wt 240.6 lb

## 2020-07-15 DIAGNOSIS — I10 Essential (primary) hypertension: Secondary | ICD-10-CM

## 2020-07-15 DIAGNOSIS — H4053X3 Glaucoma secondary to other eye disorders, bilateral, severe stage: Secondary | ICD-10-CM

## 2020-07-15 DIAGNOSIS — K5900 Constipation, unspecified: Secondary | ICD-10-CM

## 2020-07-15 DIAGNOSIS — E1165 Type 2 diabetes mellitus with hyperglycemia: Secondary | ICD-10-CM

## 2020-07-15 DIAGNOSIS — K219 Gastro-esophageal reflux disease without esophagitis: Secondary | ICD-10-CM

## 2020-07-15 DIAGNOSIS — E118 Type 2 diabetes mellitus with unspecified complications: Secondary | ICD-10-CM

## 2020-07-15 DIAGNOSIS — E8729 Other acidosis: Secondary | ICD-10-CM

## 2020-07-15 DIAGNOSIS — Z0000 Encounter for general adult medical examination without abnormal findings: Secondary | ICD-10-CM

## 2020-07-15 DIAGNOSIS — E872 Acidosis: Secondary | ICD-10-CM

## 2020-07-15 DIAGNOSIS — IMO0002 Reserved for concepts with insufficient information to code with codable children: Secondary | ICD-10-CM

## 2020-07-15 MED ORDER — INSULIN PEN NEEDLE 32G X 4 MM MISC
11 refills | Status: DC
  Filled 2020-07-15: qty 100, 20d supply, fill #0

## 2020-07-15 MED ORDER — PANTOPRAZOLE SODIUM 40 MG PO TBEC: 40.0000 mg | DELAYED_RELEASE_TABLET | Freq: Every day | ORAL | 0 refills | Status: DC

## 2020-07-15 NOTE — Patient Instructions (Addendum)
It was a pleasure seeing you in clinic. Today we discussed:   Constipation: Please try taking miralax daily to see if this helps with constipation. Drink plenty of water and avoid soda or  beverages with caffeine. You can keep track of the foods you are eating to see if there is any thing that is triggering you constipation. You medications should not be causing the constipation.  I have refilled your medication for reflux and well as pen tips for insulin. I will also repeat you labs today and call you with results.   Please follow up in about 1 month   If you have any questions or concerns, please call our clinic at [phone] between 9am-5pm and after hours call [phone] and ask for the internal medicine resident on call. If you feel you are having a medical emergency please call 911.   Thank you, we look forward to helping you remain healthy!

## 2020-07-16 LAB — BMP8+ANION GAP
Anion Gap: 18 mmol/L (ref 10.0–18.0)
BUN/Creatinine Ratio: 20 (ref 9–23)
BUN: 25 mg/dL — ABNORMAL HIGH (ref 6–24)
CO2: 15 mmol/L — ABNORMAL LOW (ref 20–29)
Calcium: 9.3 mg/dL (ref 8.7–10.2)
Chloride: 108 mmol/L — ABNORMAL HIGH (ref 96–106)
Creatinine, Ser: 1.24 mg/dL — ABNORMAL HIGH (ref 0.57–1.00)
Glucose: 143 mg/dL — ABNORMAL HIGH (ref 65–99)
Potassium: 4.6 mmol/L (ref 3.5–5.2)
Sodium: 141 mmol/L (ref 134–144)
eGFR: 52 mL/min/{1.73_m2} — ABNORMAL LOW (ref >59)

## 2020-07-17 ENCOUNTER — Encounter: Payer: Self-pay | Admitting: Student

## 2020-07-17 DIAGNOSIS — K5900 Constipation, unspecified: Secondary | ICD-10-CM | POA: Insufficient documentation

## 2020-07-17 NOTE — Assessment & Plan Note (Signed)
BP remains at goal today. Currently on losartan 100 mg daily and acetazolamide 250 mg 3 times daily for glaucoma. Will continue with current medications. BMP stable since last visit in 3/4.

## 2020-07-17 NOTE — Assessment & Plan Note (Addendum)
Patient with difficult with transportation due to glaucoma. Has been hitchhiking to appointments. Had RN discuss transportation assistance options through clinic. Patient  also has sister who lives nearby and will work with her for transportation need in future. Deferred healthcare maintenance  Issues until he has more stable transportation and until she applies for medicaid.

## 2020-07-17 NOTE — Assessment & Plan Note (Signed)
BMP repeated today. Patient reports she has been taking acetazolamide 3 times daily now. Anion gap remains elevated at 18 and bicarb is 15. Appears stable since last visit. AGMA in setting of acetazolamide use, will continue to monitor, likely will have surgery soon for glaucoma and may come off this medication.

## 2020-07-17 NOTE — Progress Notes (Signed)
   CC: constipation  HPI:  [name] is a 55 y.o. female with history listed below who presents for constipation. Please refer to problem based charting for further details and assessment and plan of current problem and chronic medical conditions.   Past Medical History:  Diagnosis Date  . CVA (cerebral vascular accident) ([place])   . Diabetes mellitus without complication ([place])   . Hypertension    Review of Systems:  Negative as per HPI  Physical Exam:  Vitals:   07/15/20 1013  BP: 132/74  Pulse: 98  SpO2: 98%  Weight: 240 lb 9.6 oz (109.1 kg)  Physical Exam Constitutional:      General: She is not in acute distress.    Appearance: She is obese.  HENT:     Head: Normocephalic and atraumatic.     Mouth/Throat:     Mouth: Mucous membranes are moist.     Pharynx: Oropharynx is clear.  Eyes:     Extraocular Movements: Extraocular movements intact.     Pupils: Pupils are equal, round, and reactive to light.  Cardiovascular:     Rate and Rhythm: Normal rate and regular rhythm.  Pulmonary:     Effort: Pulmonary effort is normal.     Breath sounds: Normal breath sounds.  Abdominal:     General: Abdomen is flat. Bowel sounds are normal. There is no distension.     Palpations: Abdomen is soft.     Tenderness: There is no abdominal tenderness. There is no guarding.  Musculoskeletal:        General: Normal range of motion.     Cervical back: Normal range of motion and neck supple.     Right lower leg: No edema.     Left lower leg: No edema.  Skin:    General: Skin is warm and dry.     Capillary Refill: Capillary refill takes less than 2 seconds.  Neurological:     General: No focal deficit present.     Mental Status: She is alert and oriented to person, place, and time.  Psychiatric:        Mood and Affect: Mood normal.        Behavior: Behavior normal.      Assessment & Plan:   See Encounters Tab for problem based charting.  Patient discussed with Dr.  Dareen Piano

## 2020-07-17 NOTE — Assessment & Plan Note (Addendum)
Reports constipation since restarting her medication after stroke in January. Notes she has BM about once a week with associated bloating and gas. Denies melena, hematochezia, nausea, or vomiting. States miralax helps somewhat, but she only takes it inconsistently.  Patient concerned this is a medication side effect of acetazolamide which was restarted in January or her PPI that was start at last visit 1 month ago. Assured her that constipation is unlikely to be due to these medications. States she has been trying to drink more water to help with this issue. Normal BS on exam, no distension, or tenderness. Advised she increase fiber intake, drink plenty of water, and avoid sodas. Also advised she take her miralax scheduled daily to see if this helps prevent constipation. She will also keep a food diary to see if there are any food triggering her constipation.

## 2020-07-17 NOTE — Assessment & Plan Note (Signed)
Followed up with ophthalmology in March. States plan is to schedule surgery for glaucoma soon. Continues on acetazolamide 3 times daily. Provided transportation assistance information to help her make it to ophthalmology appointments. BMP appears stable since last visit. Will continue present eye drops and acetazolamide.  She will call to follow up at [facility] ophthalmology,

## 2020-07-17 NOTE — Progress Notes (Signed)
Internal Medicine Clinic Attending ? ?Case discussed with Dr. Liang  At the time of the visit.  We reviewed the resident?s history and exam and pertinent patient test results.  I agree with the assessment, diagnosis, and plan of care documented in the resident?s note. ? ?

## 2020-07-17 NOTE — Assessment & Plan Note (Signed)
Currently working on getting documentation to have medications filled at [facility]. Anticipates will have this complete later this month. States she has enough medication to last until then. No symptoms of hypoglycemia, polydipsia, or polyuria. States morning fasting CBGs generally in 100-120s with a few instance in high 80s and highest in 170s twice in the last month or so. Unfortunately did not bring her meter with her to OV.   - Continue with current medications, no changes - Patient to call if she need assistance with paper work to have scripts filled  At [place] - Repeat A1c in 2 months

## 2020-07-17 NOTE — Assessment & Plan Note (Signed)
Symptoms improved on PPI, refilled pantoprazole 40 mg daily

## 2020-07-23 ENCOUNTER — Other Ambulatory Visit: Payer: Self-pay

## 2020-07-23 ENCOUNTER — Inpatient Hospital Stay (HOSPITAL_COMMUNITY)
Admission: EM | Admit: 2020-07-23 | Discharge: 2020-07-30 | DRG: 024 | Disposition: A | Discharge status: Home Health | Payer: Self-pay | Attending: Internal Medicine | Admitting: Internal Medicine

## 2020-07-23 ENCOUNTER — Inpatient Hospital Stay (HOSPITAL_COMMUNITY): Payer: Medicaid Other

## 2020-07-23 ENCOUNTER — Emergency Department (HOSPITAL_COMMUNITY): Payer: Medicaid Other

## 2020-07-23 DIAGNOSIS — IMO0002 Reserved for concepts with insufficient information to code with codable children: Secondary | ICD-10-CM | POA: Diagnosis present

## 2020-07-23 DIAGNOSIS — Z79899 Other long term (current) drug therapy: Secondary | ICD-10-CM

## 2020-07-23 DIAGNOSIS — R471 Dysarthria and anarthria: Secondary | ICD-10-CM | POA: Diagnosis present

## 2020-07-23 DIAGNOSIS — H4010X3 Unspecified open-angle glaucoma, severe stage: Secondary | ICD-10-CM | POA: Diagnosis present

## 2020-07-23 DIAGNOSIS — D649 Anemia, unspecified: Secondary | ICD-10-CM | POA: Diagnosis present

## 2020-07-23 DIAGNOSIS — I6602 Occlusion and stenosis of left middle cerebral artery: Secondary | ICD-10-CM | POA: Diagnosis present

## 2020-07-23 DIAGNOSIS — Z794 Long term (current) use of insulin: Secondary | ICD-10-CM

## 2020-07-23 DIAGNOSIS — I63412 Cerebral infarction due to embolism of left middle cerebral artery: Principal | ICD-10-CM | POA: Diagnosis present

## 2020-07-23 DIAGNOSIS — I517 Cardiomegaly: Secondary | ICD-10-CM | POA: Diagnosis present

## 2020-07-23 DIAGNOSIS — E119 Type 2 diabetes mellitus without complications: Secondary | ICD-10-CM

## 2020-07-23 DIAGNOSIS — E11610 Type 2 diabetes mellitus with diabetic neuropathic arthropathy: Secondary | ICD-10-CM | POA: Diagnosis present

## 2020-07-23 DIAGNOSIS — Z20822 Contact with and (suspected) exposure to covid-19: Secondary | ICD-10-CM | POA: Diagnosis present

## 2020-07-23 DIAGNOSIS — R29700 NIHSS score 0: Secondary | ICD-10-CM | POA: Diagnosis present

## 2020-07-23 DIAGNOSIS — H4053X3 Glaucoma secondary to other eye disorders, bilateral, severe stage: Secondary | ICD-10-CM

## 2020-07-23 DIAGNOSIS — Z791 Long term (current) use of non-steroidal anti-inflammatories (NSAID): Secondary | ICD-10-CM

## 2020-07-23 DIAGNOSIS — I63512 Cerebral infarction due to unspecified occlusion or stenosis of left middle cerebral artery: Secondary | ICD-10-CM

## 2020-07-23 DIAGNOSIS — E876 Hypokalemia: Secondary | ICD-10-CM | POA: Diagnosis present

## 2020-07-23 DIAGNOSIS — Z6839 Body mass index (BMI) 39.0-39.9, adult: Secondary | ICD-10-CM

## 2020-07-23 DIAGNOSIS — Z452 Encounter for adjustment and management of vascular access device: Secondary | ICD-10-CM

## 2020-07-23 DIAGNOSIS — R479 Unspecified speech disturbances: Secondary | ICD-10-CM

## 2020-07-23 DIAGNOSIS — E785 Hyperlipidemia, unspecified: Secondary | ICD-10-CM | POA: Diagnosis present

## 2020-07-23 DIAGNOSIS — N179 Acute kidney failure, unspecified: Secondary | ICD-10-CM | POA: Diagnosis present

## 2020-07-23 DIAGNOSIS — I69351 Hemiplegia and hemiparesis following cerebral infarction affecting right dominant side: Secondary | ICD-10-CM

## 2020-07-23 DIAGNOSIS — R4701 Aphasia: Secondary | ICD-10-CM | POA: Diagnosis present

## 2020-07-23 DIAGNOSIS — G8191 Hemiplegia, unspecified affecting right dominant side: Secondary | ICD-10-CM | POA: Diagnosis present

## 2020-07-23 DIAGNOSIS — Z7984 Long term (current) use of oral hypoglycemic drugs: Secondary | ICD-10-CM

## 2020-07-23 DIAGNOSIS — I10 Essential (primary) hypertension: Secondary | ICD-10-CM | POA: Diagnosis present

## 2020-07-23 DIAGNOSIS — E1165 Type 2 diabetes mellitus with hyperglycemia: Secondary | ICD-10-CM | POA: Diagnosis present

## 2020-07-23 DIAGNOSIS — K219 Gastro-esophageal reflux disease without esophagitis: Secondary | ICD-10-CM | POA: Diagnosis present

## 2020-07-23 DIAGNOSIS — I639 Cerebral infarction, unspecified: Secondary | ICD-10-CM

## 2020-07-23 DIAGNOSIS — I071 Rheumatic tricuspid insufficiency: Secondary | ICD-10-CM | POA: Diagnosis present

## 2020-07-23 DIAGNOSIS — H42 Glaucoma in diseases classified elsewhere: Secondary | ICD-10-CM

## 2020-07-23 DIAGNOSIS — Z91048 Other nonmedicinal substance allergy status: Secondary | ICD-10-CM

## 2020-07-23 DIAGNOSIS — Z880 Allergy status to penicillin: Secondary | ICD-10-CM

## 2020-07-23 DIAGNOSIS — Z7982 Long term (current) use of aspirin: Secondary | ICD-10-CM

## 2020-07-23 LAB — I-STAT CHEM 8, ED
BUN: 18 mg/dL (ref 6–20)
Calcium, Ion: 1.23 mmol/L (ref 1.15–1.40)
Chloride: 116 mmol/L — ABNORMAL HIGH (ref 98–111)
Creatinine, Ser: 1 mg/dL (ref 0.44–1.00)
Glucose, Bld: 122 mg/dL — ABNORMAL HIGH (ref 70–99)
HCT: 31 % — ABNORMAL LOW (ref 36.0–46.0)
Hemoglobin: 10.5 g/dL — ABNORMAL LOW (ref 12.0–15.0)
Potassium: 3.9 mmol/L (ref 3.5–5.1)
Sodium: 142 mmol/L (ref 135–145)
TCO2: 15 mmol/L — ABNORMAL LOW (ref 22–32)

## 2020-07-23 LAB — COMPREHENSIVE METABOLIC PANEL
ALT: 15 U/L (ref 0–44)
AST: 18 U/L (ref 15–41)
Albumin: 3.5 g/dL (ref 3.5–5.0)
Alkaline Phosphatase: 124 U/L (ref 38–126)
Anion gap: 11 (ref 5–15)
BUN: 17 mg/dL (ref 6–20)
CO2: 13 mmol/L — ABNORMAL LOW (ref 22–32)
Calcium: 8.6 mg/dL — ABNORMAL LOW (ref 8.9–10.3)
Chloride: 115 mmol/L — ABNORMAL HIGH (ref 98–111)
Creatinine, Ser: 1.14 mg/dL — ABNORMAL HIGH (ref 0.44–1.00)
GFR, Estimated: 58 mL/min — ABNORMAL LOW (ref >60)
Glucose, Bld: 127 mg/dL — ABNORMAL HIGH (ref 70–99)
Potassium: 3.8 mmol/L (ref 3.5–5.1)
Sodium: 139 mmol/L (ref 135–145)
Total Bilirubin: 0.4 mg/dL (ref 0.3–1.2)
Total Protein: 7 g/dL (ref 6.5–8.1)

## 2020-07-23 LAB — URINALYSIS, ROUTINE W REFLEX MICROSCOPIC
Bilirubin Urine: NEGATIVE
Glucose, UA: NEGATIVE mg/dL
Hgb urine dipstick: NEGATIVE
Ketones, ur: NEGATIVE mg/dL
Nitrite: NEGATIVE
Protein, ur: NEGATIVE mg/dL
Specific Gravity, Urine: 1.017 (ref 1.005–1.030)
pH: 5 (ref 5.0–8.0)

## 2020-07-23 LAB — RESP PANEL BY RT-PCR (FLU A&B, COVID) ARPGX2
Influenza A by PCR: NEGATIVE
Influenza B by PCR: NEGATIVE
SARS Coronavirus 2 by RT PCR: NEGATIVE

## 2020-07-23 LAB — RAPID URINE DRUG SCREEN, HOSP PERFORMED
Amphetamines: NOT DETECTED
Barbiturates: NOT DETECTED
Benzodiazepines: NOT DETECTED
Cocaine: NOT DETECTED
Opiates: NOT DETECTED
Tetrahydrocannabinol: NOT DETECTED

## 2020-07-23 LAB — DIFFERENTIAL
Abs Immature Granulocytes: 0.03 10*3/uL (ref 0.00–0.07)
Basophils Absolute: 0 10*3/uL (ref 0.0–0.1)
Basophils Relative: 0 %
Eosinophils Absolute: 0.1 10*3/uL (ref 0.0–0.5)
Eosinophils Relative: 1 %
Immature Granulocytes: 0 %
Lymphocytes Relative: 20 %
Lymphs Abs: 1.7 10*3/uL (ref 0.7–4.0)
Monocytes Absolute: 0.5 10*3/uL (ref 0.1–1.0)
Monocytes Relative: 6 %
Neutro Abs: 6.1 10*3/uL (ref 1.7–7.7)
Neutrophils Relative %: 73 %

## 2020-07-23 LAB — CBC
HCT: 33.4 % — ABNORMAL LOW (ref 36.0–46.0)
Hemoglobin: 10.4 g/dL — ABNORMAL LOW (ref 12.0–15.0)
MCH: 26.5 pg (ref 26.0–34.0)
MCHC: 31.1 g/dL (ref 30.0–36.0)
MCV: 85 fL (ref 80.0–100.0)
Platelets: 286 10*3/uL (ref 150–400)
RBC: 3.93 MIL/uL (ref 3.87–5.11)
RDW: 14.7 % (ref 11.5–15.5)
WBC: 8.5 10*3/uL (ref 4.0–10.5)
nRBC: 0 % (ref 0.0–0.2)

## 2020-07-23 LAB — I-STAT BETA HCG BLOOD, ED (MC, WL, AP ONLY): I-stat hCG, quantitative: <5 m[IU]/mL (ref <5)

## 2020-07-23 LAB — PROTIME-INR
INR: 1.1 (ref 0.8–1.2)
Prothrombin Time: 14.2 seconds (ref 11.4–15.2)

## 2020-07-23 LAB — APTT: aPTT: 36 seconds (ref 24–36)

## 2020-07-23 IMAGING — MR MR HEAD W/O CM
7 of 11 series · 25 of 48 positions shown · non-contrast
Comparison: CT [DATE].  MRI [DATE].

CLINICAL DATA: Neurological deficit.  Acute stroke suspected.

EXAM:
MRI HEAD WITHOUT CONTRAST
TECHNIQUE: Multiplanar, multiecho pulse sequences of the brain and surrounding
structures were obtained without intravenous contrast.

[Series 2: DWI · axial · 3.0mm · 0.94mm/px · z∈[-125,+20]mm · 7 of 110 slices shown (1 of 2)]
[im 1/110]
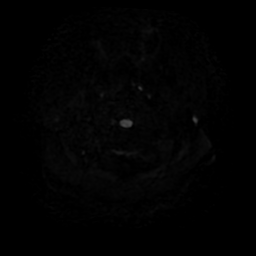
[im 19/110]
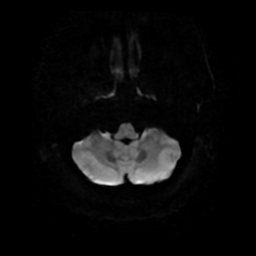
[im 37/110]
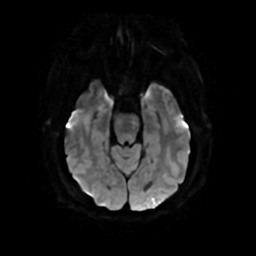
[im 55/110]
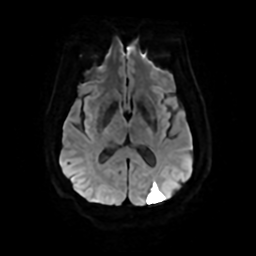
[im 73/110]
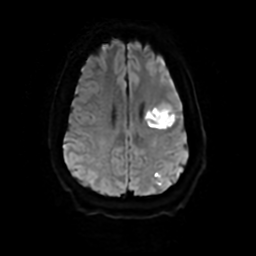
[im 91/110]
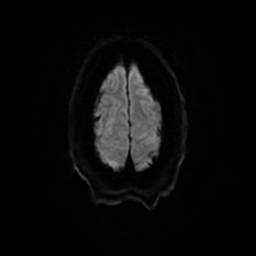
[im 110/110]
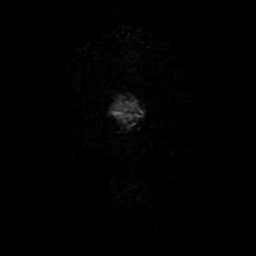

[Series 3: DWI · coronal · 4.0mm · 0.94mm/px · 6 of 78 slices shown (2 of 2)]
[im 1/78]
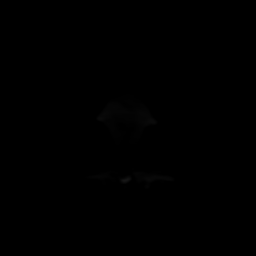
[im 16/78]
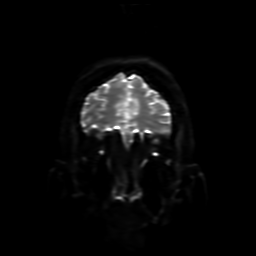
[im 31/78]
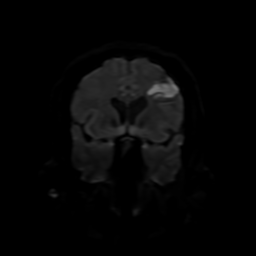
[im 47/78]
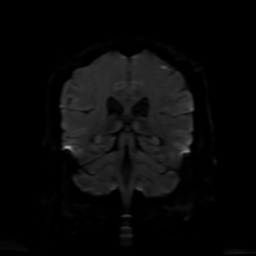
[im 62/78]
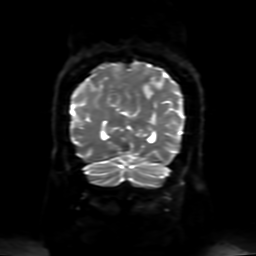
[im 78/78]
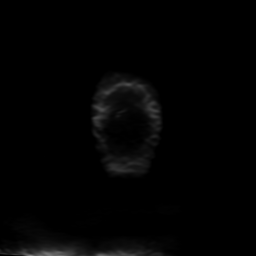

[Series 4: FLAIR · sagittal · 5.0mm · 0.23mm/px · 2 of 27 slices shown (1 of 2)]
[im 1/27]
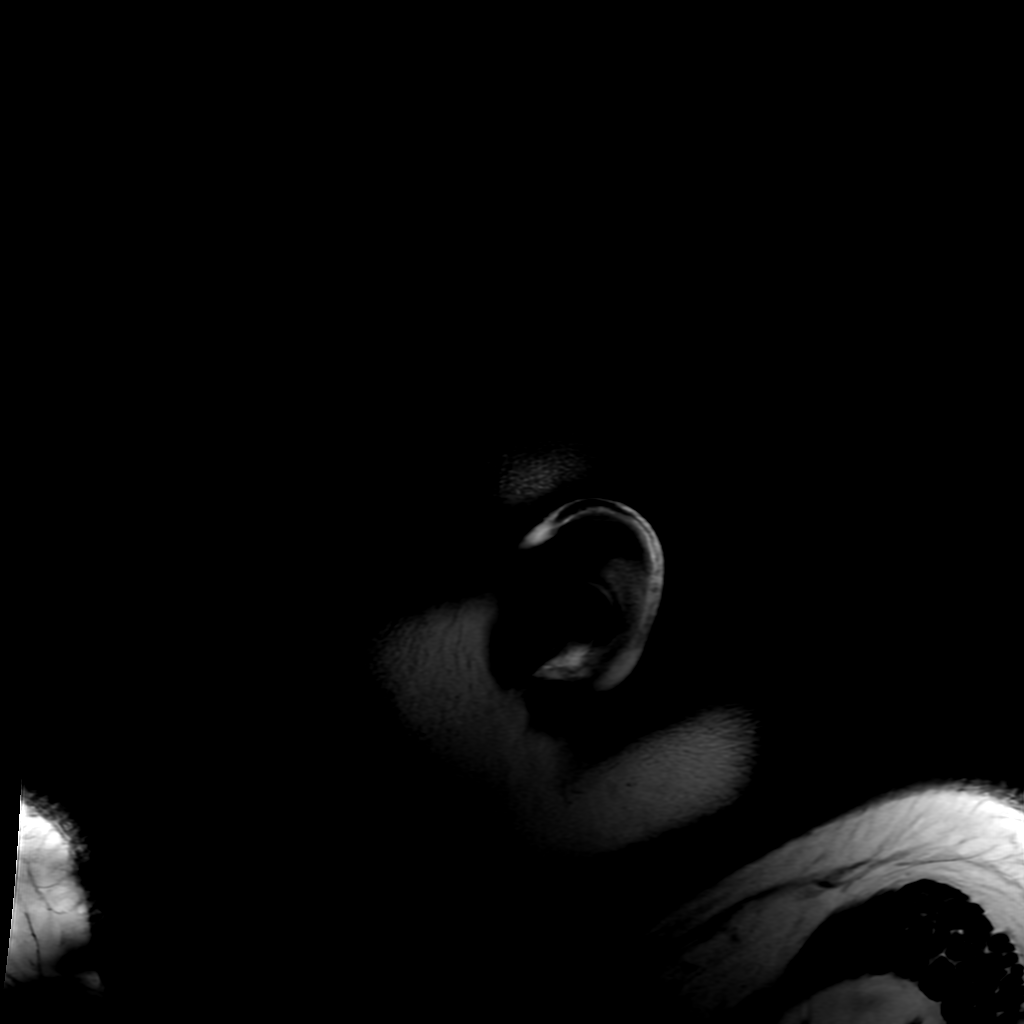
[im 27/27]
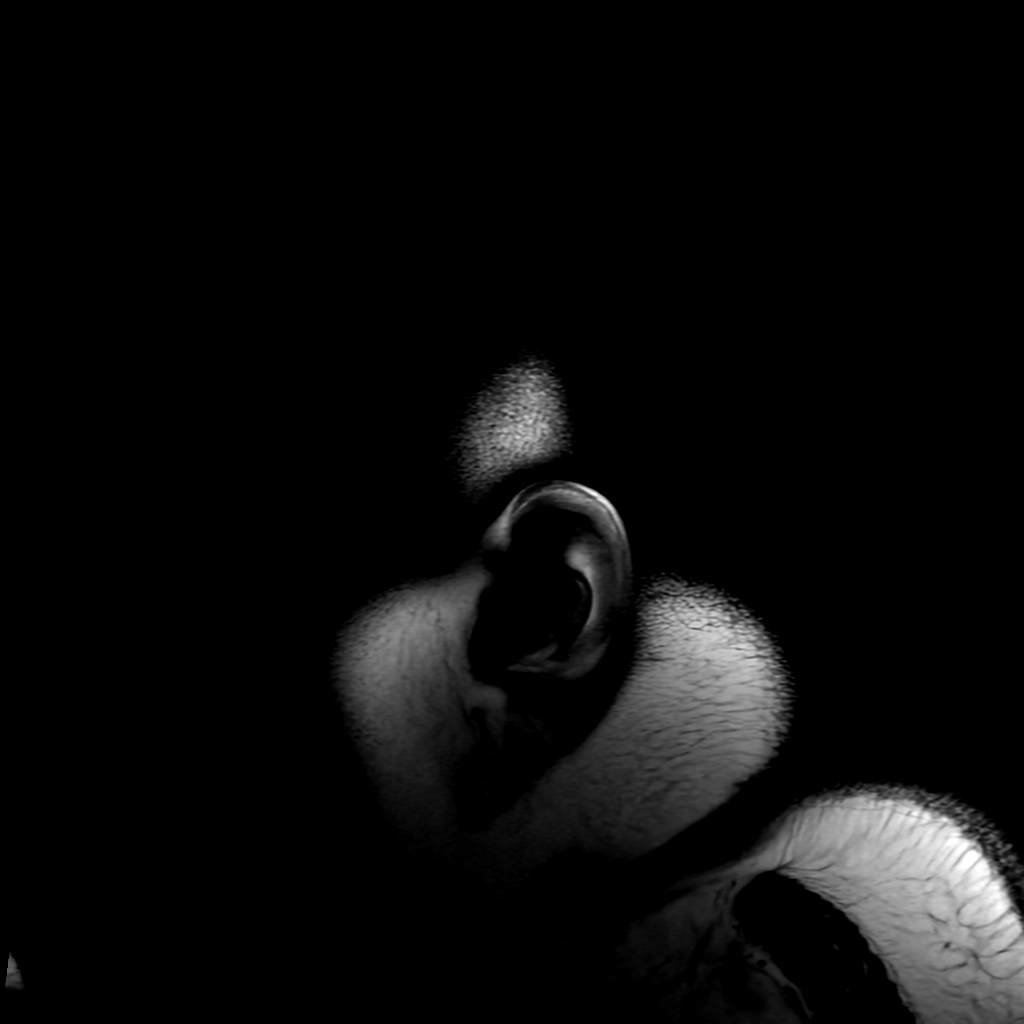

[Series 5: T2 · axial · 5.0mm · 0.23mm/px · 1 of 27 slices shown]
[im 1/27]
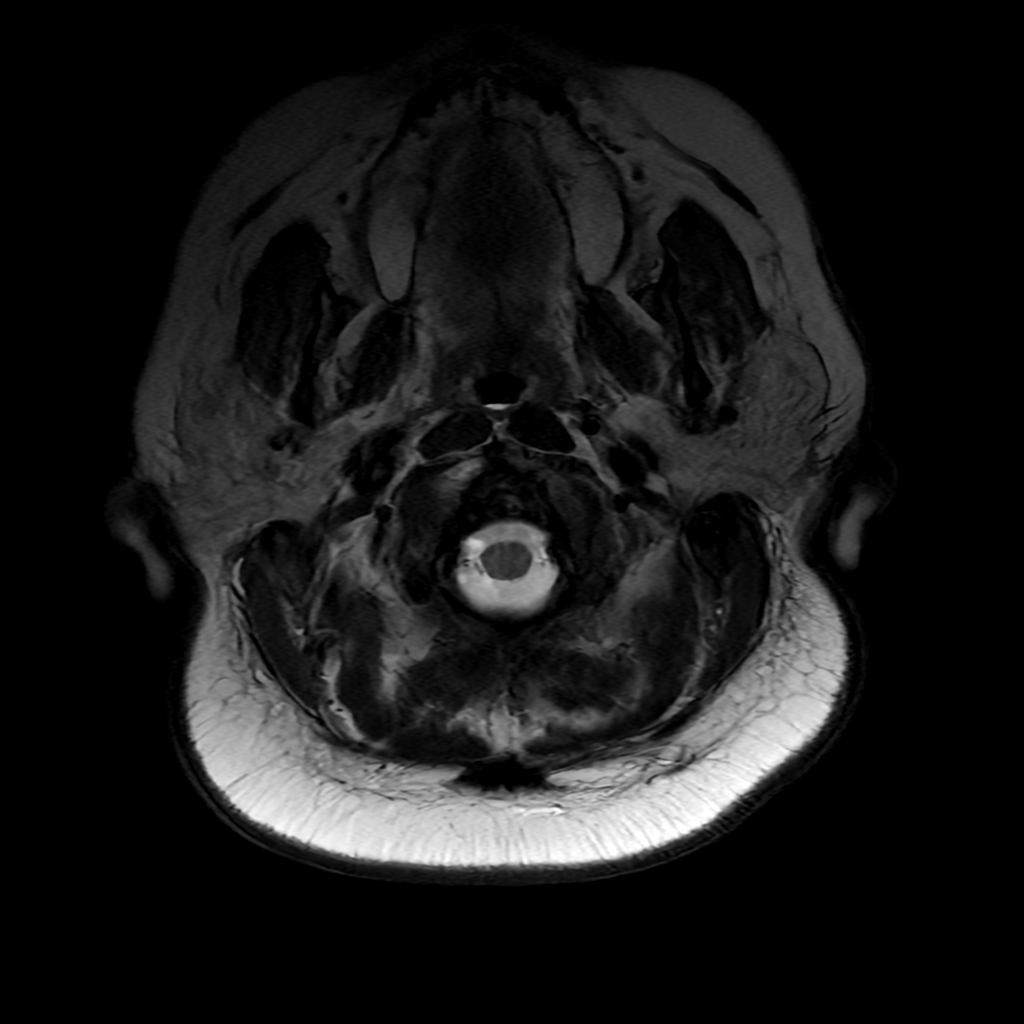

[Series 6: FLAIR · axial · 3.0mm · 0.45mm/px · z∈[-120,+28]mm · 2 of 28 slices shown (2 of 2)]
[im 1/28]
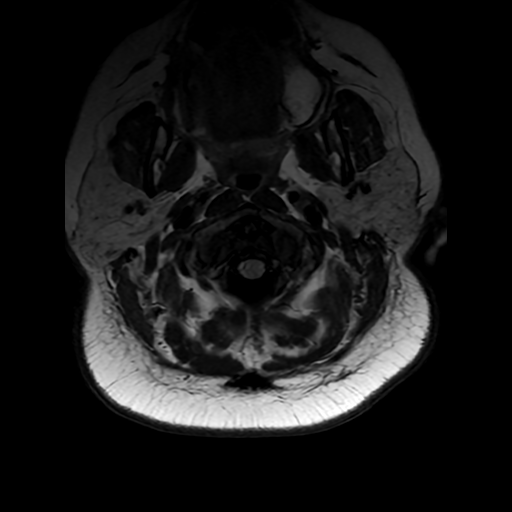
[im 28/28]
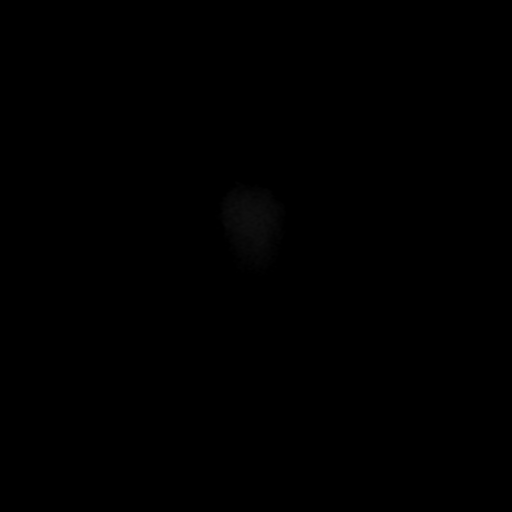

[Series 250: ADC · axial · 3.0mm · 0.94mm/px · z∈[-125,+20]mm · 4 of 55 slices shown (1 of 2)]
[im 1/55]
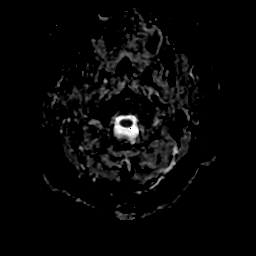
[im 19/55]
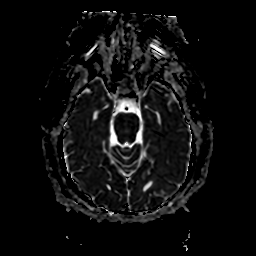
[im 37/55]
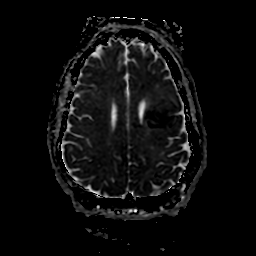
[im 55/55]
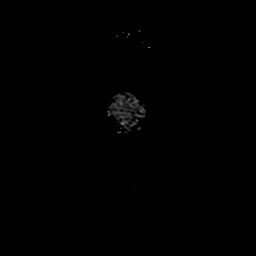

[Series 350: ADC · coronal · 4.0mm · 0.94mm/px · 3 of 39 slices shown (2 of 2)]
[im 1/39]
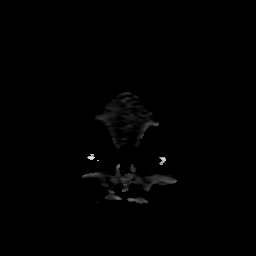
[im 20/39]
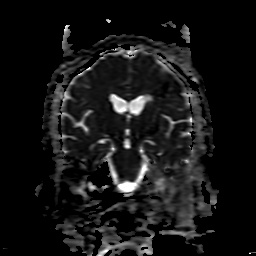
[im 39/39]
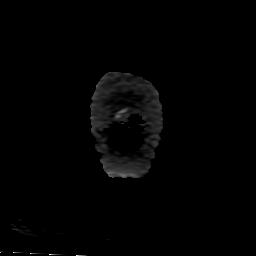

[25 of 48 positions shown; findings below may reference images not displayed]

FINDINGS: Brain: There are several acute infarctions within the left
hemisphere extending from the posterior frontal cortical and
subcortical brain to the parieto-occipital cortical and subcortical
brain. Findings could be watershed or embolic. Mild swelling of the
regions of acute infarction. Petechial blood products in the left
parieto-occipital region infarctions. Minimal petechial blood
products in the largest region of posterior frontal infarction.

No focal abnormality affects the brainstem or cerebellum. Right
cerebral hemisphere shows a few old small vessel infarctions. Left
cerebral hemisphere shows old watershed infarctions which were acute
in [DATE].

No hydrocephalus or extra-axial collection.

Vascular: Major vessels at the base of the brain show flow.

Skull and upper cervical spine: Negative

Sinuses/Orbits: Clear/normal

Other: None
IMPRESSION: 1. Several acute infarctions in the left hemisphere extending from
the posterior frontal cortical and subcortical brain to the
parieto-occipital cortical and subcortical brain. Findings could be
watershed or embolic. Petechial blood products in the largest region
of posterior frontal infarction and left parieto-occipital
infarction.
2. Old watershed infarctions on the left.

## 2020-07-23 MED ORDER — INSULIN ASPART 100 UNIT/ML ~~LOC~~ SOLN
0.0000 [IU] | Freq: Three times a day (TID) | SUBCUTANEOUS | Status: DC
Start: 2020-07-24 — End: 2020-07-30
  Administered 2020-07-24: 3 [IU] via SUBCUTANEOUS
  Administered 2020-07-25: 2 [IU] via SUBCUTANEOUS
  Administered 2020-07-26: 1 [IU] via SUBCUTANEOUS
  Administered 2020-07-27 (×2): 3 [IU] via SUBCUTANEOUS
  Administered 2020-07-27: 2 [IU] via SUBCUTANEOUS
  Administered 2020-07-29: 3 [IU] via SUBCUTANEOUS
  Administered 2020-07-29: 2 [IU] via SUBCUTANEOUS
  Administered 2020-07-29: 5 [IU] via SUBCUTANEOUS
  Administered 2020-07-30: 1 [IU] via SUBCUTANEOUS
  Administered 2020-07-30: 3 [IU] via SUBCUTANEOUS
  Administered 2020-07-30: 2 [IU] via SUBCUTANEOUS

## 2020-07-23 MED ORDER — INSULIN GLARGINE 100 UNIT/ML ~~LOC~~ SOLN
50.0000 [IU] | Freq: Every day | SUBCUTANEOUS | Status: DC
  Administered 2020-07-24 – 2020-07-30 (×7): 50 [IU] via SUBCUTANEOUS
  Filled 2020-07-23 (×7): qty 0.5

## 2020-07-23 MED ORDER — FERROUS SULFATE 325 (65 FE) MG PO TABS
325.0000 mg | ORAL_TABLET | Freq: Every day | ORAL | Status: DC
  Administered 2020-07-24 – 2020-07-30 (×6): 325 mg via ORAL
  Filled 2020-07-23 (×6): qty 1

## 2020-07-23 MED ORDER — LATANOPROST 0.005 % OP SOLN
1.0000 [drp] | Freq: Every day | OPHTHALMIC | Status: DC
  Administered 2020-07-24 – 2020-07-29 (×7): 1 [drp] via OPHTHALMIC
  Filled 2020-07-23 (×2): qty 2.5

## 2020-07-23 MED ORDER — SENNOSIDES-DOCUSATE SODIUM 8.6-50 MG PO TABS: 1.0000 | ORAL_TABLET | Freq: Every evening | ORAL | Status: DC | PRN

## 2020-07-23 MED ORDER — DORZOLAMIDE HCL-TIMOLOL MAL 2-0.5 % OP SOLN
1.0000 [drp] | Freq: Two times a day (BID) | OPHTHALMIC | Status: DC
  Administered 2020-07-24 – 2020-07-30 (×13): 1 [drp] via OPHTHALMIC
  Filled 2020-07-23 (×3): qty 10

## 2020-07-23 MED ORDER — POLYETHYLENE GLYCOL 3350 17 G PO PACK: 17.0000 g | PACK | Freq: Every day | ORAL | Status: DC | PRN

## 2020-07-23 MED ORDER — MONTELUKAST SODIUM 10 MG PO TABS
10.0000 mg | ORAL_TABLET | Freq: Every day | ORAL | Status: DC
  Administered 2020-07-24 – 2020-07-29 (×7): 10 mg via ORAL
  Filled 2020-07-23 (×9): qty 1

## 2020-07-23 MED ORDER — ATORVASTATIN CALCIUM 40 MG PO TABS
40.0000 mg | ORAL_TABLET | Freq: Every day | ORAL | Status: DC
  Administered 2020-07-24 – 2020-07-30 (×7): 40 mg via ORAL
  Filled 2020-07-23: qty 1
  Filled 2020-07-23: qty 4
  Filled 2020-07-23 (×5): qty 1

## 2020-07-23 MED ORDER — BRIMONIDINE TARTRATE 0.2 % OP SOLN
1.0000 [drp] | Freq: Three times a day (TID) | OPHTHALMIC | Status: DC
  Administered 2020-07-24 – 2020-07-30 (×19): 1 [drp] via OPHTHALMIC
  Filled 2020-07-23 (×3): qty 5

## 2020-07-23 MED ORDER — ACETAMINOPHEN 325 MG PO TABS
650.0000 mg | ORAL_TABLET | ORAL | Status: DC | PRN
  Administered 2020-07-24 – 2020-07-27 (×4): 650 mg via ORAL
  Filled 2020-07-23 (×4): qty 2

## 2020-07-23 MED ORDER — ACETAMINOPHEN 160 MG/5ML PO SOLN: 650.0000 mg | ORAL | Status: DC | PRN

## 2020-07-23 MED ORDER — PANTOPRAZOLE SODIUM 40 MG PO TBEC
40.0000 mg | DELAYED_RELEASE_TABLET | Freq: Every day | ORAL | Status: DC
  Administered 2020-07-24 – 2020-07-30 (×6): 40 mg via ORAL
  Filled 2020-07-23 (×6): qty 1

## 2020-07-23 MED ORDER — ACETAMINOPHEN 650 MG RE SUPP: 650.0000 mg | RECTAL | Status: DC | PRN

## 2020-07-23 MED ORDER — STROKE: EARLY STAGES OF RECOVERY BOOK
Freq: Once | Status: AC
  Filled 2020-07-23: qty 1

## 2020-07-23 MED ORDER — GABAPENTIN 100 MG PO CAPS
100.0000 mg | ORAL_CAPSULE | Freq: Three times a day (TID) | ORAL | Status: DC | PRN
  Administered 2020-07-25 – 2020-07-27 (×5): 100 mg via ORAL
  Filled 2020-07-23 (×5): qty 1

## 2020-07-23 MED ORDER — INSULIN ASPART 100 UNIT/ML ~~LOC~~ SOLN
0.0000 [IU] | Freq: Every day | SUBCUTANEOUS | Status: DC
  Administered 2020-07-28: 5 [IU] via SUBCUTANEOUS
  Administered 2020-07-29: 2 [IU] via SUBCUTANEOUS

## 2020-07-23 MED ORDER — ENOXAPARIN SODIUM 40 MG/0.4ML ~~LOC~~ SOLN
40.0000 mg | SUBCUTANEOUS | Status: DC
  Administered 2020-07-24 – 2020-07-27 (×4): 40 mg via SUBCUTANEOUS
  Filled 2020-07-23 (×4): qty 0.4

## 2020-07-23 NOTE — H&P (Addendum)
Date: 07/24/2020               Patient Name:  Sharon Blankenship MRN: [id number]  DOB: 06/04/1965 Age / Sex: 55 y.o., female   PCP: [name], MD         Medical Service: Internal Medicine Teaching Service         Attending Physician: Dr. 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  She states that this onset on 07/22/20 at approximately 2300 while she was on the phone.  She reported that this symptoms resolved.  The following morning she had recurrence of the symptoms that approximately 0800 and called EMS.  States during this time she continued to have difficulty finding words.  She denies any exacerbation of her residual CVA symptoms of right-sided extremity weakness.   She denies any vision changes, difficulty swallowing, difficulty hearing, decrease in station, difficulty with extremity motor function.  Denies chest pain, shortness of breath, palpitations.  Denies fluttering in her chest.  Denies any loss of bowel or bladder function.  Denies any recent trauma.  Denies any recent illness.  Denies loss of consciousness.  Denies recent systemic symptoms of weight loss or weight gain.  Patient states she has been compliant with all her medications, after her prior CVA she was continued on Plavix and aspirin and discontinued the Plavix after 3 months.  ED Course:  Patient was evaluated for difficulty  speaking. Stroke work-up performed, MRI without contrast revealed several acute infarction of the left hemisphere extending from the posterior frontal cortical and subcortical brain to parieto-occipital cortical and subcortical brain watershed versus embolic etiology.  Petechial blood products in the large region of posterior frontal infarction and left parietal occipital infarction.  Because of these findings neurology as well as IMTS were consulted for further management.    Meds:  Current Meds  Medication Sig  . acetaZOLAMIDE (DIAMOX) 125 MG tablet Take 2 tablets (250 mg total) by mouth in the morning, at noon, in the evening, and at bedtime. (Patient taking differently: Take 125 mg by mouth in the morning, at noon, in the evening, and at bedtime.)  . aspirin 81 MG EC tablet Take 1 tablet (81 mg total) by mouth daily. Swallow whole. (Patient taking differently: Take 81 mg by mouth daily.)  . atorvastatin (LIPITOR) 20 MG tablet Take 1 tablet (20 mg total) by mouth daily.  Marland Kitchen azelastine (ASTELIN) 0.1 % nasal spray Place 1 spray into both nostrils 2 (two) times daily as needed for allergies.  . brimonidine (ALPHAGAN) 0.2 % ophthalmic solution Place 1 drop into both eyes 3 (three) times daily.  . dorzolamide-timolol (COSOPT) 22.3-6.8 MG/ML ophthalmic solution Place 1 drop into the right eye 2 (two) times daily.  . ferrous sulfate 325 (65 FE) MG tablet Take 325 mg by mouth daily.  Marland Kitchen FIASP FLEXTOUCH 100 UNIT/ML FlexTouch Pen Inject 18 Units into the skin daily as needed (  high blood sugar).  . gabapentin (NEURONTIN) 100 MG capsule Take 1 capsule (100 mg total) by mouth 3 (three) times daily as needed (nerve pain).  Marland Kitchen latanoprost (XALATAN) 0.005 % ophthalmic solution Place 1 drop into both eyes at bedtime.  Marland Kitchen loratadine (CLARITIN) 10 MG tablet Take 10 mg by mouth daily as needed for allergies.  Marland Kitchen losartan (COZAAR) 100 MG tablet Take 1 tablet (100 mg total) by mouth daily.  . meloxicam (MOBIC) 15 MG tablet  Take 15 mg by mouth daily as needed for pain.  . metFORMIN (GLUCOPHAGE) 1000 MG tablet Take 1 tablet (1,000 mg total) by mouth 2 (two) times daily with a meal.  . methocarbamol (ROBAXIN) 500 MG tablet Take 500 mg by mouth 3 (three) times daily as needed for muscle spasms.  . montelukast (SINGULAIR) 10 MG tablet TAKE 1 TABLET (10 MG TOTAL) BY MOUTH DAILY. (Patient taking differently: Take 10 mg by mouth at bedtime.)  . Multiple Vitamin (MULTIVITAMIN PO) Take 1 tablet by mouth daily.  Marland Kitchen OZEMPIC, 1 MG/DOSE, 4 MG/3ML SOPN Inject 1 mg into the skin once a week. On Wednesday  . pantoprazole (PROTONIX) 40 MG tablet Take 1 tablet (40 mg total) by mouth daily.  . polyethylene glycol (MIRALAX / GLYCOLAX) packet Take 17 g by mouth daily as needed for mild constipation.  . TRESIBA FLEXTOUCH 200 UNIT/ML FlexTouch Pen INJECT 76 UNITS INTO THE SKIN DAILY.    Past Medical History:  Diagnosis Date  . CVA (cerebral vascular accident) ([place])   . Diabetes mellitus without complication ([place])   . Hypertension   . Open-angle glaucoma     Past Surgical History:  Procedure Laterality Date  . ABDOMINAL HYSTERECTOMY     2016   Social:  Lives alone Occupation: Student Level of Function: Able to perform all her ADLs without assistance PCP: Dr. [name], MD Substances: Denies tobacco, alcohol, illicit substance use.  Denies taking any medications that are not prescribed to her  No family history on file.  Allergies: Allergies as of 07/23/2020 - Review Complete 07/23/2020  Allergen Reaction Noted  . Aloe Shortness Of Breath and Rash 05/10/2018  . Penicillins Hives, Itching, and Swelling 08/22/2014    Review of Systems: A complete ROS was negative except as per HPI.   OBJECTIVE:   Physical Exam: Blood pressure (!) 158/96, pulse 97, temperature 98.5 F (36.9 C), temperature source Oral, resp. rate 13, height 5\' 7"  (1.702 m), weight 113.4 kg, last menstrual period 04/24/2014, SpO2 100  %. Constitutional: Well-appearing female resting bed comfortably HENT: normocephalic atraumatic, mucous membranes moist Eyes: conjunctiva non-erythematous Neck: supple Cardiovascular: regular rate and rhythm, no m/r/g Pulmonary/Chest: normal work of breathing on room air, lungs clear to auscultation bilaterally Abdominal: soft, non-tender, non-distended MSK: normal bulk and tone.  Cool left lower extremity from mid shin to foot.  Posterior tibialis and dorsalis pedis pulses appreciated bilaterally. Neurological: alert & oriented x 3, patient without facial asymmetry.  Normal speech.throughout exam patient states she was having trouble "finding words." symmetrical smile.  Extraocular motion intact.  PERRL.  Patient without uvular deviation.  Right upper extremity strength 4 out of 5.  Left upper extremity strength 5 out of 5.  Normal finger-to-nose.  Normal heel-to-shin.  No pronator drift appreciated.  Sensation intact throughout. Skin: warm and dry Psych: Normal mood and thought process  Labs: CBC    Component Value Date/Time   WBC 8.5 07/23/2020 1155   RBC 3.93 07/23/2020 1155   HGB 10.5 (L) 07/23/2020  1208   HGB 11.7 06/05/2020 1520   HCT 31.0 (L) 07/23/2020 1208   HCT 35.8 06/05/2020 1520   PLT 286 07/23/2020 1155   PLT 332 06/05/2020 1520   MCV 85.0 07/23/2020 1155   MCV 82 06/05/2020 1520   MCH 26.5 07/23/2020 1155   MCHC 31.1 07/23/2020 1155   RDW 14.7 07/23/2020 1155   RDW 14.4 06/05/2020 1520   LYMPHSABS 1.7 07/23/2020 1155   LYMPHSABS 2.1 06/05/2020 1520   MONOABS 0.5 07/23/2020 1155   EOSABS 0.1 07/23/2020 1155   EOSABS 0.1 06/05/2020 1520   BASOSABS 0.0 07/23/2020 1155   BASOSABS 0.0 06/05/2020 1520     CMP     Component Value Date/Time   NA 142 07/23/2020 1208   NA 141 07/15/2020 1119   K 3.9 07/23/2020 1208   CL 116 (H) 07/23/2020 1208   CO2 13 (L) 07/23/2020 1155   GLUCOSE 122 (H) 07/23/2020 1208   BUN 18 07/23/2020 1208   BUN 25 (H) 07/15/2020 1119    CREATININE 1.00 07/23/2020 1208   CALCIUM 8.6 (L) 07/23/2020 1155   PROT 7.0 07/23/2020 1155   PROT 7.4 06/05/2020 1520   ALBUMIN 3.5 07/23/2020 1155   ALBUMIN 4.6 06/05/2020 1520   AST 18 07/23/2020 1155   ALT 15 07/23/2020 1155   ALKPHOS 124 07/23/2020 1155   BILITOT 0.4 07/23/2020 1155   BILITOT 0.3 06/05/2020 1520   GFRNONAA 58 (L) 07/23/2020 1155   GFRAA 71 04/24/2020 1501    Imaging: MR ANGIO HEAD WO CONTRAST  Result Date: 07/24/2020 CLINICAL DATA:  Difficulty speaking EXAM: MRA HEAD WITHOUT CONTRAST TECHNIQUE: Angiographic images of the Circle of Willis were obtained using MRA technique without intravenous contrast. COMPARISON:  Brain MRI 07/23/2020 FINDINGS: POSTERIOR CIRCULATION: --Vertebral arteries: Normal --Inferior cerebellar arteries: Normal. --Basilar artery: Normal. --Superior cerebellar arteries: Normal. --Posterior cerebral arteries: Normal. ANTERIOR CIRCULATION: --Intracranial internal carotid arteries: Normal. --Anterior cerebral arteries (ACA): Normal. --Middle cerebral arteries (MCA): There is short segment occlusion of the left MCA proximal M2 segments superior division. Moderate stenosis of the superior division of the right MCA proximal M2 segment. ANATOMIC VARIANTS: None IMPRESSION: 1. Short segment occlusion of the left MCA proximal M2 segment superior division. 2. Moderate stenosis of the superior division of the right MCA proximal M2 segment. 3. These results were called by telephone at the time of interpretation on 07/24/2020 at 1:03 am to provider [facility] , who verbally acknowledged these results. These results were communicated to Dr. Kerney Elbe at 1:03 am on 07/24/2020 by text page via the [facility] messaging system. Electronically Signed   By: Ulyses Jarred M.D.   On: 07/24/2020 01:04   MR BRAIN WO CONTRAST  Result Date: 07/23/2020 CLINICAL DATA:  Neurological deficit.  Acute stroke suspected. EXAM: MRI HEAD WITHOUT CONTRAST TECHNIQUE: Multiplanar,  multiecho pulse sequences of the brain and surrounding structures were obtained without intravenous contrast. COMPARISON:  CT 03/22/2020.  MRI 03/21/2020. FINDINGS: Brain: There are several acute infarctions within the left hemisphere extending from the posterior frontal cortical and subcortical brain to the parieto-occipital cortical and subcortical brain. Findings could be watershed or embolic. Mild swelling of the regions of acute infarction. Petechial blood products in the left parieto-occipital region infarctions. Minimal petechial blood products in the largest region of posterior frontal infarction. No focal abnormality affects the brainstem or cerebellum. Right cerebral hemisphere shows a few old small vessel infarctions. Left cerebral hemisphere shows old watershed infarctions which were acute in December of 2021. No hydrocephalus  or extra-axial collection. Vascular: Major vessels at the base of the brain show flow. Skull and upper cervical spine: Negative Sinuses/Orbits: Clear/normal Other: None IMPRESSION: 1. Several acute infarctions in the left hemisphere extending from the posterior frontal cortical and subcortical brain to the parieto-occipital cortical and subcortical brain. Findings could be watershed or embolic. Petechial blood products in the largest region of posterior frontal infarction and left parieto-occipital infarction. 2. 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 Electronically Signed   By: Nelson Chimes M.D.   On: 07/23/2020 17:28    EKG: personally reviewed my interpretation is rate of 100 bpm, normal sinus rhythm.  Normal axis.  Normal QRS complexes.  No ST wave changes.  Compared to prior from 03/24/20, no acute differences.   ASSESSMENT & PLAN:    Assessment & Plan by Problem: Active Problems:   Stroke (HCC)   Sharay Bellissimo is a 55 y.o. with pertinent PMH of type 2 diabetes, hypertension, open-angle glaucoma, previous CVA (03/2020, multiple left MCA territory embolic stroke)  with residual right-sided weakness who presented with difficulties with speech and admitted for acute CVA on hospital day 1  Acute CVA, multiple left MCA territory subacute ischemic infarctions Hx multiple left MCA territory embolic stroke 2/2 symptomatic MCA territory stenosis and hypertension Patient presented with difficult speech however this appears resolved upon my examination.  She did continue state that she did feel as though she was having difficulty finding words however.  MRI revealed several acute infarcts in the left hemisphere as per above.  Per neurology this consistent with embolic strokes and would be atypical for watershed infarcts.  Also noted to have petechial blood products in the largest region of the posterior frontal infarction left parieto-occipital infarction MRI head revealed short segment occlusion of the left MCA proximal M2 segment superior division.  Moderate stenosis.  Division of the right MCA proximal M2 segment.  She has multiple strict risk factors including diabetes hypertension prior stroke.  She has a history of HLD that is well treated. -Hemoglobin A1c 7.7 -lipid panel total cholesterol 108, LDL 61 -Will need fasting lipid panel to assess Tg's -Carotid ultrasound pending -PT/OT/speech evaluation -Restart Plavix and continue aspirin, benefits outweigh the risks per neurology. -Echocardiogram pending -Telemetry monitoring -Frequent neurochecks -Awaiting response from nurse if patient passed bedside swallow eval -Modified Rankin Scale - 0 points -Glasgow Coma Score of 15.   Essential hypertension Continue home blood pressure medication.  Type 2 diabetes A1c 7.7.  Current regimen of Tresiba 76 units daily, Ozempic 1 mg daily, metformin 1000 mg twice daily.  Fiasp 18 units daily as needed.  Continue gabapentin 100 mg 3 times daily for nerve pain. -SSI, Lantus 50 units daily during admission  Hyperlipidemia Currently on atorvastatin 20 mg daily.  Lipid  panel total cholesterol 108 LDL of 61.  Glaucoma History of glaucoma, on acetazolamide.  Neurology recommended consulting ophthalmology as strokes occurred after taking acetazolamide.  May need to be switched to alternative medicine per them. -Consider ophthalmology consult -Continue Xalatan solution both eyes daily, Cosopt 1 drop right eye twice daily, Alphagan 1 drop both eyes 3 times daily  Cool to touch LLE Patient is left lower extremity cool to touch. -Left lower extremity ultrasound ABI with without TBI  GERD Protonix 40 mg daily  Diet: NPO until she passes bedside swallow eval or SLP eval VTE: Enoxaparin IVF: None,None Code: Full  Prior to Admission Living Arrangement: Home, living alone Anticipated Discharge Location: Home Barriers to Discharge: further stroke work-up  Dispo: Admit patient to Inpatient with expected length of stay greater than 2 midnights.  Signed: Riesa Pope, MD Internal Medicine Resident PGY-1 Pager: [phone]  07/24/2020, 2:29 AM

## 2020-07-23 NOTE — Consult Note (Signed)
NEURO HOSPITALIST CONSULT NOTE   Requestig physician: Dr. Sherry Ruffing  Reason for Consult: Subacute strokes seen on MRI  History obtained from:  Patient and Chart    HPI:                                                                                                                                          Sharon Blankenship is an 55 y.o. female with DM, HTN, open angle glaucoma and right sided weakness from a prior stroke who presented to the ED via EMS following TIA symptoms on Tuesday night.  She states that she was talking to her someone on the phone and was having trouble forming her words, which resolved before going to bed. She had a headache transiently on Tuesday night as well, before going to bed. She woke up on Wednesday morning and at about 0800 had recurrence of the speech symptoms.  EMS was called and on their arrival at [place] felt that there were no residual deficits. symptoms and stroke scale negative. Initial CBG was 134 and BP was 134/82.   She had a stroke in December 2021 with right-sided symptoms including weakness and dysesthesia of the right arm and leg. She feels like those symptoms have gradually improved.  She was on Plavix for 3 months and now takes a baby aspirin daily.  She states that she has not followed up with Neurology since her stroke but does see her PCP regularly. She follows her blood sugars and states that they have been doing well.    EKG:  Sinus rhythm Borderline T abnormalities, diffuse leads  Past Medical History:  Diagnosis Date  . CVA (cerebral vascular accident) ([place])   . Diabetes mellitus without complication ([place])   . Hypertension   . Open-angle glaucoma     Past Surgical History:  Procedure Laterality Date  . ABDOMINAL HYSTERECTOMY     2016    No family history on file.            Social History:  reports that she has never smoked. She has never used smokeless tobacco. She reports that she does not drink alcohol and does  not use drugs.  Allergies  Allergen Reactions  . Aloe Shortness Of Breath and Rash    SOB, Swelling, Rash, Itching  . Penicillins Hives, Itching and Swelling    Did it involve swelling of the face/tongue/throat, SOB, or low BP? Y Did it involve sudden or severe rash/hives, skin peeling, or any reaction on the inside of your mouth or nose? N Did you need to seek medical attention at a hospital or doctor's office? Y When did it last happen? Several Years Ago If all above answers are "NO", may proceed with cephalosporin use.     MEDICATIONS:  No current facility-administered medications on file prior to encounter.   Current Outpatient Medications on File Prior to Encounter  Medication Sig Dispense Refill  . acetaZOLAMIDE (DIAMOX) 125 MG tablet Take 2 tablets (250 mg total) by mouth in the morning, at noon, in the evening, and at bedtime. (Patient taking differently: Take 125 mg by mouth in the morning, at noon, in the evening, and at bedtime.) 240 tablet 1  . aspirin 81 MG EC tablet Take 1 tablet (81 mg total) by mouth daily. Swallow whole. (Patient taking differently: Take 81 mg by mouth daily.) 30 tablet 11  . atorvastatin (LIPITOR) 20 MG tablet Take 1 tablet (20 mg total) by mouth daily. 90 tablet 3  . azelastine (ASTELIN) 0.1 % nasal spray Place 1 spray into both nostrils 2 (two) times daily as needed for allergies.    . brimonidine (ALPHAGAN) 0.2 % ophthalmic solution Place 1 drop into both eyes 3 (three) times daily.    . dorzolamide-timolol (COSOPT) 22.3-6.8 MG/ML ophthalmic solution Place 1 drop into the right eye 2 (two) times daily.    . ferrous sulfate 325 (65 FE) MG tablet Take 325 mg by mouth daily.    Marland Kitchen FIASP FLEXTOUCH 100 UNIT/ML FlexTouch Pen Inject 18 Units into the skin daily as needed (high blood sugar).    . gabapentin (NEURONTIN) 100 MG capsule Take  1 capsule (100 mg total) by mouth 3 (three) times daily as needed (nerve pain). 90 capsule 3  . latanoprost (XALATAN) 0.005 % ophthalmic solution Place 1 drop into both eyes at bedtime.    Marland Kitchen loratadine (CLARITIN) 10 MG tablet Take 10 mg by mouth daily as needed for allergies.    Marland Kitchen losartan (COZAAR) 100 MG tablet Take 1 tablet (100 mg total) by mouth daily. 30 tablet 3  . meloxicam (MOBIC) 15 MG tablet Take 15 mg by mouth daily as needed for pain.    . metFORMIN (GLUCOPHAGE) 1000 MG tablet Take 1 tablet (1,000 mg total) by mouth 2 (two) times daily with a meal. 120 tablet 2  . methocarbamol (ROBAXIN) 500 MG tablet Take 500 mg by mouth 3 (three) times daily as needed for muscle spasms.    . montelukast (SINGULAIR) 10 MG tablet TAKE 1 TABLET (10 MG TOTAL) BY MOUTH DAILY. (Patient taking differently: Take 10 mg by mouth at bedtime.) 30 tablet 2  . Multiple Vitamin (MULTIVITAMIN PO) Take 1 tablet by mouth daily.    Marland Kitchen OZEMPIC, 1 MG/DOSE, 4 MG/3ML SOPN Inject 1 mg into the skin once a week. On Wednesday 9 mL 0  . pantoprazole (PROTONIX) 40 MG tablet Take 1 tablet (40 mg total) by mouth daily. 90 tablet 0  . polyethylene glycol (MIRALAX / GLYCOLAX) packet Take 17 g by mouth daily as needed for mild constipation.    . TRESIBA FLEXTOUCH 200 UNIT/ML FlexTouch Pen INJECT 76 UNITS INTO THE SKIN DAILY. 18 mL 2  . Insulin Pen Needle 32G X 4 MM MISC USE AS DIRECTED TO INJECT DIABETES MEDICATION 5 TIMES DAILY. 200 each 11     Scheduled: .  stroke: mapping our early stages of recovery book   Does not apply Once  . atorvastatin  40 mg Oral Daily  . brimonidine  1 drop Both Eyes TID  . dorzolamide-timolol  1 drop Right Eye BID  . enoxaparin (LOVENOX) injection  40 mg Subcutaneous Q24H  . ferrous sulfate  325 mg Oral Daily  . insulin aspart  0-5 Units Subcutaneous QHS  . insulin aspart  0-9 Units Subcutaneous TID WC  . insulin glargine  50 Units Subcutaneous Daily  . latanoprost  1 drop Both Eyes QHS  .  montelukast  10 mg Oral QHS  . pantoprazole  40 mg Oral Daily    ROS:                                                                                                                                       No CP, SOB, nausea, new vision loss or dysarthria. States that her speech has returned to normal. Has chronic vision loss from her glaucoma. Other ROS as per HPI.   Blood pressure (!) 147/68, pulse 90, temperature 98.5 F (36.9 C), temperature source Oral, resp. rate 10, height 5\' 7"  (1.702 m), weight 113.4 kg, last menstrual period 04/24/2014, SpO2 100 %.   General Examination:                                                                                                       Physical Exam  HEENT-  [place]/AT    Lungs- Respirations unlabored Extremities- No edema  Neurological Examination Mental Status: Awake and alert. Thought content appropriate. Speech fluent with intact naming and comprehension. Able to follow all commands without error. No evidence for word finding difficulty. No dysarthria. Oriented x 5.  Cranial Nerves: II: Temporal visual fields intact with no extinction to DSS. PERRL.  III,IV, VI: No ptosis. EOMI.  V,VII: Smile symmetric. Facial temp sensation equal bilaterally.  VIII: Hearing intact to conversation IX,X: No hypophonia XI: Symmetric XII: Midline tongue extension Motor: Right : Upper extremity   5/5    Left:     Upper extremity   5/5  Lower extremity   5/5     Lower extremity   5/5 No pronator drift.  Sensory: Temp and light touch intact throughout, bilaterally. There is extinction on the right to DSS.  Deep Tendon Reflexes: 2+ and symmetric throughout. Positive Hoffman's sign on the right.  Cerebellar: No ataxia with FNF bilaterally  Gait: Deferred   Lab Results: Basic Metabolic Panel: Recent Labs  Lab 07/23/20 1155 07/23/20 1208  NA 139 142  K 3.8 3.9  CL 115* 116*  CO2 13*  --   GLUCOSE 127* 122*  BUN 17 18  CREATININE 1.14* 1.00  CALCIUM  8.6*  --     CBC: Recent Labs  Lab 07/23/20 1155 07/23/20 1208  WBC 8.5  --   NEUTROABS 6.1  --   HGB  10.4* 10.5*  HCT 33.4* 31.0*  MCV 85.0  --   PLT 286  --     Cardiac Enzymes: No results for input(s): CKTOTAL, CKMB, CKMBINDEX, TROPONINI in the last 168 hours.  Lipid Panel: No results for input(s): CHOL, TRIG, HDL, CHOLHDL, VLDL, LDLCALC in the last 168 hours.  Imaging: MR BRAIN WO CONTRAST  Result Date: 07/23/2020 CLINICAL DATA:  Neurological deficit.  Acute stroke suspected. EXAM: MRI HEAD WITHOUT CONTRAST TECHNIQUE: Multiplanar, multiecho pulse sequences of the brain and surrounding structures were obtained without intravenous contrast. COMPARISON:  CT 03/22/2020.  MRI 03/21/2020. FINDINGS: Brain: There are several acute infarctions within the left hemisphere extending from the posterior frontal cortical and subcortical brain to the parieto-occipital cortical and subcortical brain. Findings could be watershed or embolic. Mild swelling of the regions of acute infarction. Petechial blood products in the left parieto-occipital region infarctions. Minimal petechial blood products in the largest region of posterior frontal infarction. No focal abnormality affects the brainstem or cerebellum. Right cerebral hemisphere shows a few old small vessel infarctions. Left cerebral hemisphere shows old watershed infarctions which were acute in December of 2021. No hydrocephalus or extra-axial collection. Vascular: Major vessels at the base of the brain show flow. Skull and upper cervical spine: Negative Sinuses/Orbits: Clear/normal Other: None IMPRESSION: 1. Several acute infarctions in the left hemisphere extending from the posterior frontal cortical and subcortical brain to the parieto-occipital cortical and subcortical brain. Findings could be watershed or embolic. Petechial blood products in the largest region of posterior frontal infarction and left parieto-occipital infarction. 2. Old watershed  infarctions on the left. Electronically Signed   By: Nelson Chimes M.D.   On: 07/23/2020 17:28   TTE 03/23/20: 1. Left ventricular ejection fraction, by estimation, is 55 to 60%. The  left ventricle has normal function. The left ventricle has no regional  wall motion abnormalities. There is mild concentric left ventricular  hypertrophy. Left ventricular diastolic parameters are consistent with Grade I diastolic dysfunction (impaired relaxation).  2. Right ventricular systolic function is normal. The right ventricular  size is normal.  3. The mitral valve is degenerative. No evidence of mitral valve  regurgitation.  4. The aortic valve is tricuspid. There is mild calcification of the  aortic valve. There is mild thickening of the aortic valve. Aortic valve  regurgitation is not visualized.  5. Agitated saline contrast bubble study was negative, with no evidence  of any interatrial shunt.   Assessment: 55 year old female presenting with waxing and waning speech deficit. MRI brain reveals multiple left MCA territory subacute ischemic infarctions.  1. Exam reveals no speech deficit or focal weakness. There is extinction on the right to DSS and a positive Hoffman's sign on the right.   2. MRI brain: Several acute infarctions in the left hemisphere extending from the posterior frontal cortical and subcortical brain to the parieto-occipital cortical and subcortical brain. The appearance is most consistent with embolic strokes and would be atypical for watershed infarctions. There are petechial blood products in the largest region of posterior frontal infarction and left parieto-occipital infarction. Also noted are old watershed infarctions on the left. 3. MRA head: Short segment occlusion of the left MCA proximal M2 segment superior division. Moderate stenosis of the superior division of the right MCA proximal M2 segment. 4. Stroke risk factors: DM, HTN, prior stroke  Recommendations: 1. HgbA1c,  fasting lipid panel 2. Carotid ultrasound 3. PT consult, OT consult, Speech consult 4. Increase atorvastatin to 40 mg po qd  5. Add Plavix to ASA. Benefits for stroke prevention outweigh relatively low risk of hemorrhagic complications. The foci of hemorrhage seen on MRI appear most consistent with petechial hemorrhagic conversion and are small in size.  6. BP management. Out of the permissive HTN time window.  7. Patient states that her two strokes occurred after taking acetazolamide, which is a diuretic medication being used for her glaucoma. As watershed infarctions are on the DDx based on the Radiology report, she may need to be switched to an alternate medication for her glaucoma. Would consult Ophthalmology for this.   8. Blood glucose management.   9. Risk factor modification to include weight loss counseling 10. Telemetry monitoring 11. Frequent neuro checks 12. NPO until passes stroke swallow screen    Electronically signed: Dr. Kerney Elbe 07/23/2020, 7:31 PM

## 2020-07-23 NOTE — Hospital Course (Addendum)
Admitted 07/23/2020  Allergies: Aloe and Penicillins Pertinent Hx: ***CVA w right-sided hemiplegia. HTN, DM, open angle glaucoma  55 y.o. female p/w ***difficulty speaking, waxing and waning since just prior to bedtime last night.  *Acute stroke: Several acute infarctions in the left hemisphere per MRI, watershed or embolic per radiology.  Also petechial blood products. Stroke work-up per neurology.  has not followed up with neurology Consults: Neurology  Meds: *** pending neuro rec VTE ppx: SCDs IVF: none Diet: npo pending speech eval  [place] Signs *  []  f/u echo []  f/u neuro rec   O/N events:     Home meds: Acetazolamide, ASA, Lipitor, gabapentin, losartan, metformin, methocarbamol, montelukast, pantoprazole, Tyler Aas

## 2020-07-23 NOTE — ED Provider Notes (Signed)
[facility] EMERGENCY DEPARTMENT Provider Note   CSN: [id number] Arrival date & time: 07/23/20  1053     History No chief complaint on file.   Sharon Blankenship is a 55 y.o. female.  HPI She presents for evaluation of difficulty speaking, waxing and waning since just prior to bedtime last night.  She had a headache transiently last night, before going to bed.  She typically takes Tylenol for headaches with improvement.  She has been having headaches for the last several weeks, on and off in the evenings, prior to bedtime.  She had a stroke in December 2021, left brain, with right-sided symptoms including weakness and dysesthesia of the right arm and leg.  She feels like the symptoms have gradually improved.  She was transiently on Plavix for 3 months, now takes a baby aspirin daily.  She states she has not followed up with neurology, since her stroke but does see her PCP regularly.  She follows her blood sugar and states they have been doing well.  She denies shortness of breath, chest pain, nausea or vomiting.  There are no other known modifying factors.    Past Medical History:  Diagnosis Date  . CVA (cerebral vascular accident) ([place])   . Diabetes mellitus without complication ([place])   . Hypertension   . Open-angle glaucoma     Patient Active Problem List   Diagnosis Date Noted  . Constipation 07/17/2020  . GERD (gastroesophageal reflux disease) 06/06/2020  . Healthcare maintenance 06/06/2020  . High anion gap metabolic acidosis 67/34/1937  . Sinus tachycardia 05/10/2020  . Secondary open-angle glaucoma of both eyes, severe stage 04/25/2020  . Hypertension 04/25/2020  . DM (diabetes mellitus), type 2, uncontrolled with complications ([place]) 90/24/0973  . Hyperlipidemia 04/25/2020  . CVA (cerebral vascular accident) ([place]) 03/21/2020    Past Surgical History:  Procedure Laterality Date  . ABDOMINAL HYSTERECTOMY     2016     OB History   No obstetric history  on file.     No family history on file.  Social History   Tobacco Use  . Smoking status: Never Smoker  . Smokeless tobacco: Never Used  Substance Use Topics  . Alcohol use: Never  . Drug use: Never    Home Medications Prior to Admission medications   Medication Sig Start Date End Date Taking? Authorizing Provider  acetaZOLAMIDE (DIAMOX) 125 MG tablet Take 2 tablets (250 mg total) by mouth in the morning, at noon, in the evening, and at bedtime. Patient taking differently: Take 125 mg by mouth in the morning, at noon, in the evening, and at bedtime. 06/06/20 10/04/20 Yes Christian, Rylee, MD  aspirin 81 MG EC tablet Take 1 tablet (81 mg total) by mouth daily. Swallow whole. Patient taking differently: Take 81 mg by mouth daily. 06/06/20  Yes Christian, Rylee, MD  atorvastatin (LIPITOR) 20 MG tablet Take 1 tablet (20 mg total) by mouth daily. 06/06/20  Yes Christian, Rylee, MD  azelastine (ASTELIN) 0.1 % nasal spray Place 1 spray into both nostrils 2 (two) times daily as needed for allergies. 07/02/20  Yes [provider]  brimonidine (ALPHAGAN) 0.2 % ophthalmic solution Place 1 drop into both eyes 3 (three) times daily. 02/18/20  Yes [provider]  dorzolamide-timolol (COSOPT) 22.3-6.8 MG/ML ophthalmic solution Place 1 drop into the right eye 2 (two) times daily. 02/10/20  Yes [provider]  ferrous sulfate 325 (65 FE) MG tablet Take 325 mg by mouth daily.   Yes [provider]  FIASP FLEXTOUCH 100 UNIT/ML FlexTouch Pen Inject 18 Units into the skin daily as needed (high blood sugar). 03/21/20  Yes [provider]  gabapentin (NEURONTIN) 100 MG capsule Take 1 capsule (100 mg total) by mouth 3 (three) times daily as needed (nerve pain). 06/06/20  Yes Christian, Rylee, MD  latanoprost (XALATAN) 0.005 % ophthalmic solution Place 1 drop into both eyes at bedtime. 02/12/20  Yes [provider]  loratadine (CLARITIN) 10 MG tablet Take 10 mg by  mouth daily as needed for allergies. 01/17/20  Yes [provider]  losartan (COZAAR) 100 MG tablet Take 1 tablet (100 mg total) by mouth daily. 06/06/20 06/06/21 Yes Christian, Rylee, MD  meloxicam (MOBIC) 15 MG tablet Take 15 mg by mouth daily as needed for pain. 01/01/20  Yes [provider]  metFORMIN (GLUCOPHAGE) 1000 MG tablet Take 1 tablet (1,000 mg total) by mouth 2 (two) times daily with a meal. 06/06/20  Yes Christian, Rylee, MD  methocarbamol (ROBAXIN) 500 MG tablet Take 500 mg by mouth 3 (three) times daily as needed for muscle spasms. 03/04/20  Yes [provider]  montelukast (SINGULAIR) 10 MG tablet TAKE 1 TABLET (10 MG TOTAL) BY MOUTH DAILY. Patient taking differently: Take 10 mg by mouth at bedtime. 04/24/20 04/24/21 Yes [name], MD  Multiple Vitamin (MULTIVITAMIN PO) Take 1 tablet by mouth daily.   Yes [provider]  OZEMPIC, 1 MG/DOSE, 4 MG/3ML SOPN Inject 1 mg into the skin once a week. On Wednesday 06/06/20  Yes Christian, Rylee, MD  pantoprazole (PROTONIX) 40 MG tablet Take 1 tablet (40 mg total) by mouth daily. 07/15/20 07/15/21 Yes [name], MD  polyethylene glycol [facility] / Floria Raveling) packet Take 17 g by mouth daily as needed for mild constipation.   Yes [provider]  TRESIBA FLEXTOUCH 200 UNIT/ML FlexTouch Pen INJECT 76 UNITS INTO THE SKIN DAILY. 04/24/20 04/24/21 Yes [name], MD  Insulin Pen Needle 32G X 4 MM MISC USE AS DIRECTED TO INJECT DIABETES MEDICATION 5 TIMES DAILY. 07/15/20 07/15/21  [name], MD    Allergies    Aloe and Penicillins  Review of Systems   Review of Systems  All other systems reviewed and are negative.   Physical Exam Updated Vital Signs BP (!) 150/76   Pulse 90   Temp 98.5 F (36.9 C) (Oral)   Resp 15   Ht 5\' 7"  (1.702 m)   Wt 113.4 kg   LMP 04/24/2014   SpO2 100%   BMI 39.16 kg/m   Physical Exam Vitals and nursing note reviewed.  Constitutional:      General: She is  not in acute distress.    Appearance: She is well-developed. She is not ill-appearing, toxic-appearing or diaphoretic.  HENT:     Head: Normocephalic and atraumatic.     Right Ear: External ear normal.     Left Ear: External ear normal.  Eyes:     Conjunctiva/sclera: Conjunctivae normal.     Pupils: Pupils are equal, round, and reactive to light.  Neck:     Trachea: Phonation normal.  Cardiovascular:     Rate and Rhythm: Normal rate and regular rhythm.     Heart sounds: Normal heart sounds.  Pulmonary:     Effort: Pulmonary effort is normal.     Breath sounds: Normal breath sounds.  Abdominal:     Palpations: Abdomen is soft.     Tenderness: There is no abdominal tenderness.  Musculoskeletal:  General: Normal range of motion.     Cervical back: Normal range of motion and neck supple.  Skin:    General: Skin is warm and dry.  Neurological:     Mental Status: She is alert and oriented to person, place, and time.     Cranial Nerves: No cranial nerve deficit.     Sensory: No sensory deficit.     Motor: No abnormal muscle tone.     Coordination: Coordination normal.  Psychiatric:        Behavior: Behavior normal.        Thought Content: Thought content normal.        Judgment: Judgment normal.     ED Results / Procedures / Treatments   Labs (all labs ordered are listed, but only abnormal results are displayed) Labs Reviewed  CBC - Abnormal; Notable for the following components:      Result Value   Hemoglobin 10.4 (*)    HCT 33.4 (*)    All other components within normal limits  COMPREHENSIVE METABOLIC PANEL - Abnormal; Notable for the following components:   Chloride 115 (*)    CO2 13 (*)    Glucose, Bld 127 (*)    Creatinine, Ser 1.14 (*)    Calcium 8.6 (*)    GFR, Estimated 58 (*)    All other components within normal limits  I-STAT CHEM 8, ED - Abnormal; Notable for the following components:   Chloride 116 (*)    Glucose, Bld 122 (*)    TCO2 15 (*)     Hemoglobin 10.5 (*)    HCT 31.0 (*)    All other components within normal limits  RESP PANEL BY RT-PCR (FLU A&B, COVID) ARPGX2  PROTIME-INR  APTT  DIFFERENTIAL  ETHANOL  RAPID URINE DRUG SCREEN, HOSP PERFORMED  URINALYSIS, ROUTINE W REFLEX MICROSCOPIC  I-STAT BETA HCG BLOOD, ED ([facility], [facility], [facility] ONLY)    EKG EKG Interpretation  Date/Time:  Wednesday July 23 2020 11:49:40 EDT Ventricular Rate:  99 PR Interval:  148 QRS Duration: 90 QT Interval:  331 QTC Calculation: 425 R Axis:   60 Text Interpretation: Sinus rhythm Borderline T abnormalities, diffuse leads since last tracing no significant change Confirmed by Daleen Bo [phone]) on 07/23/2020 2:24:59 PM   Radiology No results found.  Procedures Procedures   Medications Ordered in ED Medications - No data to display  ED Course  I have reviewed the triage vital signs and the nursing notes.  Pertinent labs & imaging results that were available during my care of the patient were reviewed by me and considered in my medical decision making (see chart for details).    MDM Rules/Calculators/A&P                           Patient Vitals for the past 24 hrs:  BP Temp Temp src Pulse Resp SpO2 Height Weight  07/23/20 1530 (!) 150/76 -- -- 90 15 100 % -- --  07/23/20 1459 (!) 148/75 -- -- 93 13 100 % -- --  07/23/20 1330 (!) 166/107 -- -- 97 15 100 % -- --  07/23/20 1315 (!) 152/105 -- -- 94 18 100 % -- --  07/23/20 1300 (!) 161/96 -- -- 93 13 100 % -- --  07/23/20 1152 136/78 98.5 F (36.9 C) Oral 98 18 100 % -- --  07/23/20 1116 138/86 98.4 F (36.9 C) Oral 96 14 100 % -- --  07/23/20  1112 -- -- -- -- -- -- 5\' 7"  (1.702 m) 113.4 kg     Medical Decision Making:  This patient is presenting for evaluation of difficulty speaking, which does require a range of treatment options, and is a complaint that involves a high risk of morbidity and mortality. The differential diagnoses include CVA, metabolic disorder, nonspecific  intracranial abnormality. I decided to review old records, and in summary middle-aged female presenting with difficulty talking, waxing and waning, with a history of left brain stroke and right-sided hemiplegia.  I did not require additional historical information from anyone.  Clinical Laboratory Tests Ordered, included CBC, Metabolic panel and COVID and flu tests. Review indicates normal except chloride high, glucose high, hemoglobin low, CO2 low, creatinine high. Radiologic Tests Ordered, included MRI brain.    Critical Interventions-clinical evaluation, laboratory testing, MRI imaging, observation  After These Interventions, the Patient was reevaluated and was found to be stable in the ED.  MRI imaging ordered to rule out stroke.  If this is negative she can be discharged.  She has had a previous left brain stroke and is right-handed.  CRITICAL CARE-no Performed by: Daleen Bo  Nursing Notes Reviewed/ Care Coordinated Applicable Imaging Reviewed Interpretation of Laboratory Data incorporated into ED treatment   Plan-disposition by Dr. Sherry Ruffing, following MRI imaging.    Final Clinical Impression(s) / ED Diagnoses Final diagnoses:  Difficulty with speech    Rx / DC Orders ED Discharge Orders    None       Daleen Bo, MD 07/23/20 [phone]

## 2020-07-23 NOTE — ED Triage Notes (Signed)
Patient presents to ED via [facility] for having TIA symptoms last night.  Patient states that she was talking to her someone on the phone last night and was having trouble forming her words, it subsided before going to bed.  Patient then woke up this morning and around 0800 had the same symptoms of having trouble forming words.  EMS states that on arrival of 0945 this morning there were no symptoms and stroke scale negative.  Patient has right sided weakness from previous stroke.    CBG: 134 BP: 134/82

## 2020-07-23 NOTE — ED Provider Notes (Signed)
4:48 PM Care assumed from Dr. Eulis Foster.  At time of transfer care, patient is awaiting for MRI to look for etiology of speech abnormality today.  Plan of care is to reassess after imaging is completed to determine disposition.  MRI shows acute stroke.  I spoke to neurology who recommends admission for further stroke work-up.   Clinical Impression: 1. Difficulty with speech   2. Cerebrovascular accident (CVA), unspecified mechanism ([place])     Disposition: Admit  This note was prepared with assistance of Dragon voice recognition software. Occasional wrong-word or sound-a-like substitutions may have occurred due to the inherent limitations of voice recognition software.      Airiel Oblinger, Gwenyth Allegra, MD 07/24/20 [phone]

## 2020-07-24 ENCOUNTER — Inpatient Hospital Stay (HOSPITAL_COMMUNITY): Payer: Self-pay

## 2020-07-24 ENCOUNTER — Other Ambulatory Visit (HOSPITAL_COMMUNITY): Payer: Self-pay

## 2020-07-24 DIAGNOSIS — I639 Cerebral infarction, unspecified: Secondary | ICD-10-CM

## 2020-07-24 DIAGNOSIS — I6389 Other cerebral infarction: Secondary | ICD-10-CM

## 2020-07-24 DIAGNOSIS — E118 Type 2 diabetes mellitus with unspecified complications: Secondary | ICD-10-CM

## 2020-07-24 DIAGNOSIS — R479 Unspecified speech disturbances: Secondary | ICD-10-CM

## 2020-07-24 DIAGNOSIS — E1165 Type 2 diabetes mellitus with hyperglycemia: Secondary | ICD-10-CM

## 2020-07-24 DIAGNOSIS — E785 Hyperlipidemia, unspecified: Secondary | ICD-10-CM

## 2020-07-24 DIAGNOSIS — I517 Cardiomegaly: Secondary | ICD-10-CM | POA: Diagnosis present

## 2020-07-24 DIAGNOSIS — I10 Essential (primary) hypertension: Secondary | ICD-10-CM

## 2020-07-24 DIAGNOSIS — I63512 Cerebral infarction due to unspecified occlusion or stenosis of left middle cerebral artery: Secondary | ICD-10-CM

## 2020-07-24 LAB — GLUCOSE, CAPILLARY
Glucose-Capillary: 236 mg/dL — ABNORMAL HIGH (ref 70–99)
Glucose-Capillary: 174 mg/dL — ABNORMAL HIGH (ref 70–99)

## 2020-07-24 LAB — HEMOGLOBIN A1C
Hgb A1c MFr Bld: 7.7 % — ABNORMAL HIGH (ref 4.8–5.6)
Mean Plasma Glucose: 174.29 mg/dL

## 2020-07-24 LAB — ECHOCARDIOGRAM COMPLETE
AV Mean grad: 4 mmHg
AV Peak grad: 8.9 mmHg
Ao pk vel: 1.49 m/s
Area-P 1/2: 3.83 cm2
Calc EF: 51.7 %
Height: 67 in
S' Lateral: 2 cm
Single Plane A2C EF: 46.4 %
Single Plane A4C EF: 54.5 %
Weight: 4000 oz

## 2020-07-24 LAB — LIPID PANEL
Cholesterol: 108 mg/dL (ref 0–200)
HDL: 24 mg/dL — ABNORMAL LOW (ref >40)
LDL Cholesterol: 61 mg/dL (ref 0–99)
Total CHOL/HDL Ratio: 4.5 RATIO
Triglycerides: 113 mg/dL (ref <150)
VLDL: 23 mg/dL (ref 0–40)

## 2020-07-24 LAB — CBG MONITORING, ED
Glucose-Capillary: 119 mg/dL — ABNORMAL HIGH (ref 70–99)
Glucose-Capillary: 127 mg/dL — ABNORMAL HIGH (ref 70–99)
Glucose-Capillary: 153 mg/dL — ABNORMAL HIGH (ref 70–99)

## 2020-07-24 IMAGING — MR MR MRA HEAD W/O CM
1 series · 19 of 48 positions shown · non-contrast
Comparison: Brain MRI [DATE]

CLINICAL DATA: Difficulty speaking

EXAM:
MRA HEAD WITHOUT CONTRAST
TECHNIQUE: Angiographic images of the Circle of Willis were obtained using MRA
technique without intravenous contrast.

[Series 5: 3d cow · axial · 0.5mm · 0.41mm/px · z∈[-148,-67]mm · 19 of 172 slices shown]
[im 1/172]
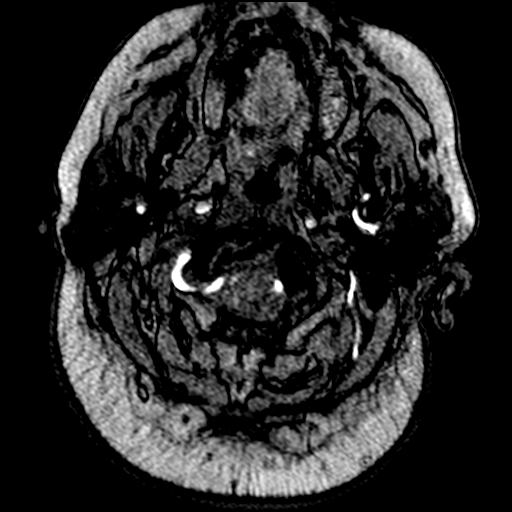
[im 4/172]
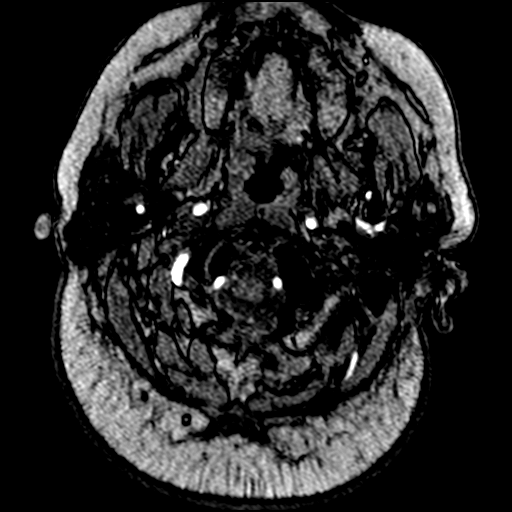
[im 8/172]
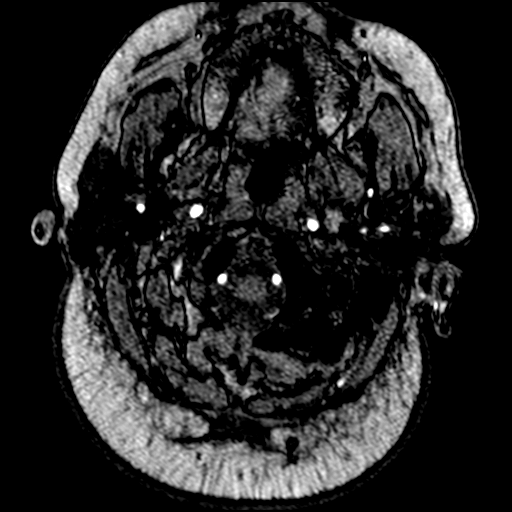
[im 11/172]
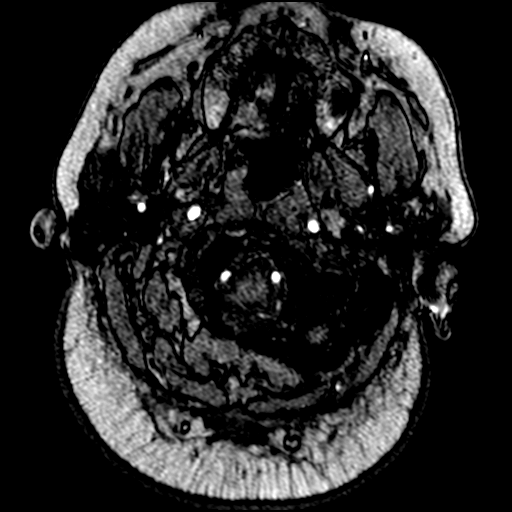
[im 15/172]
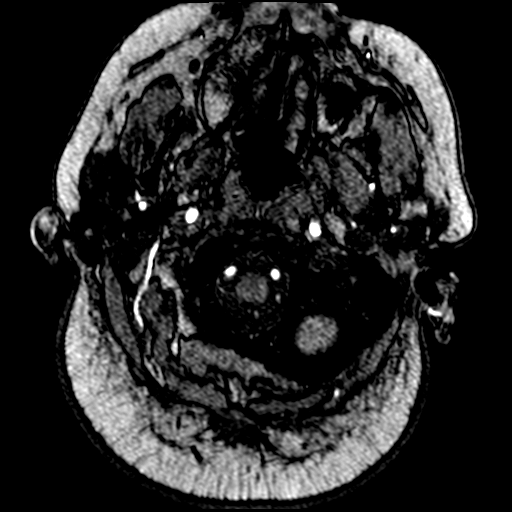
[im 19/172]
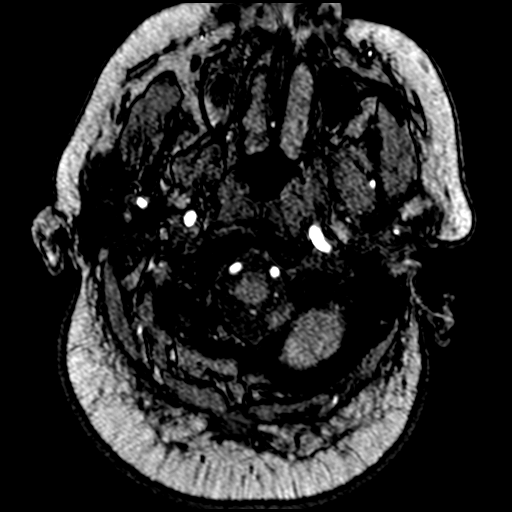
[im 22/172]
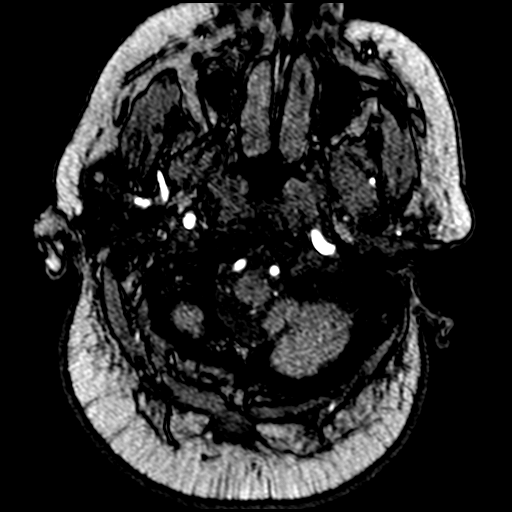
[im 26/172]
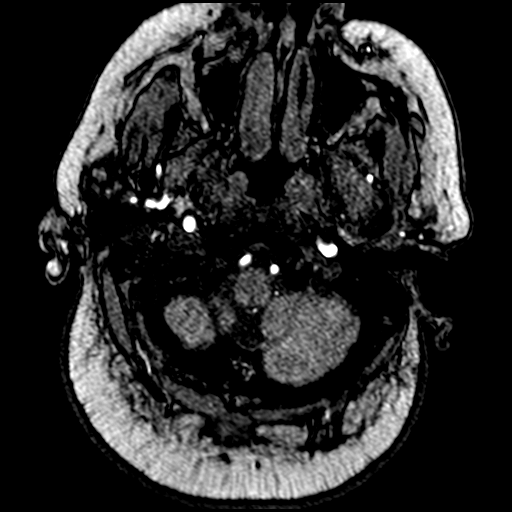
[im 30/172]
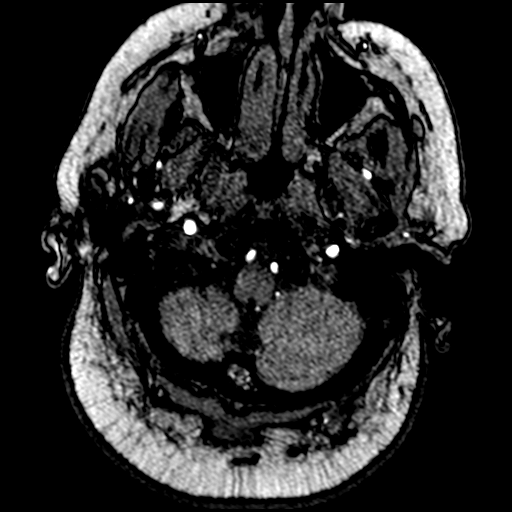
[im 33/172]
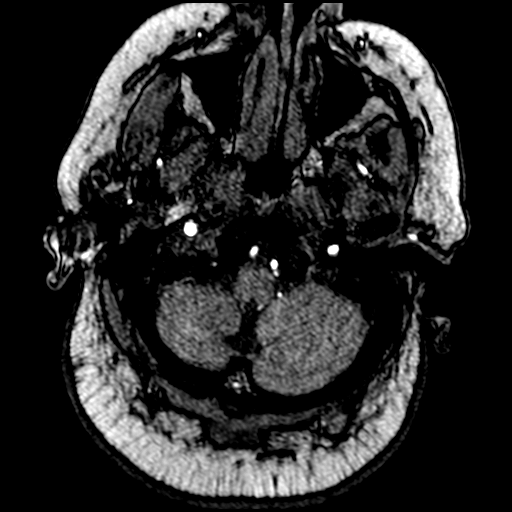
[im 37/172]
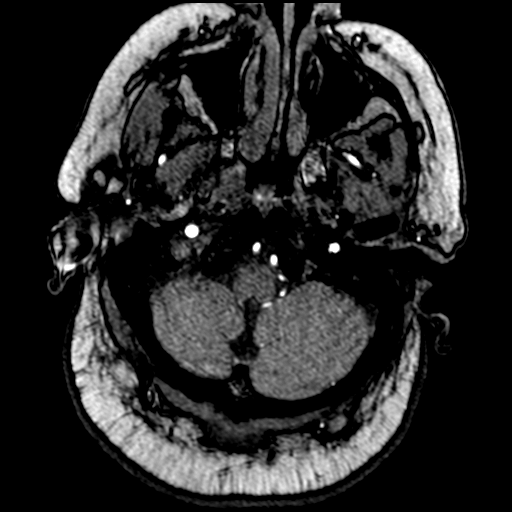
[im 55/172]
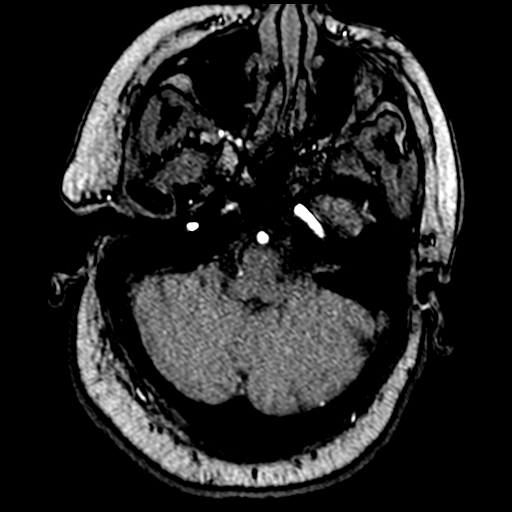
[im 77/172]
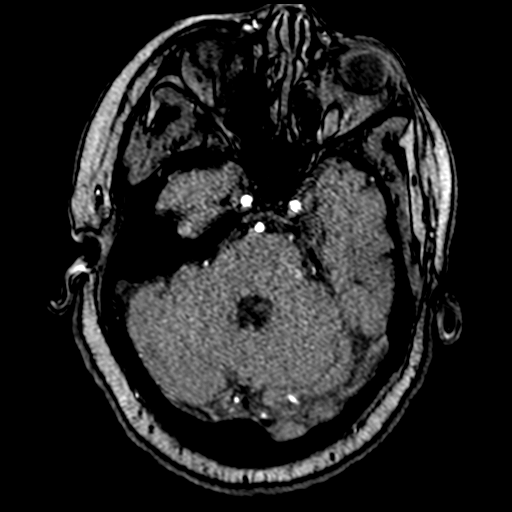
[im 88/172]
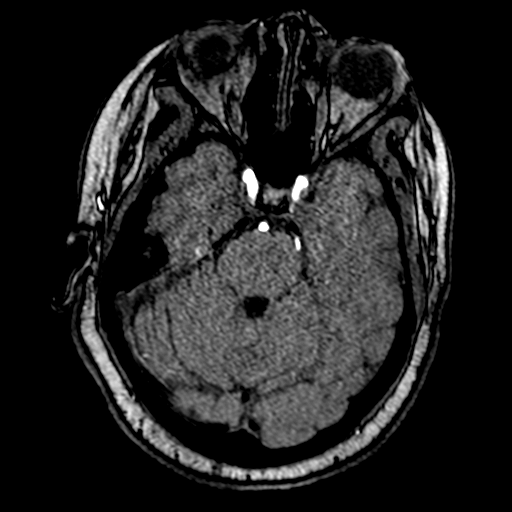
[im 99/172]
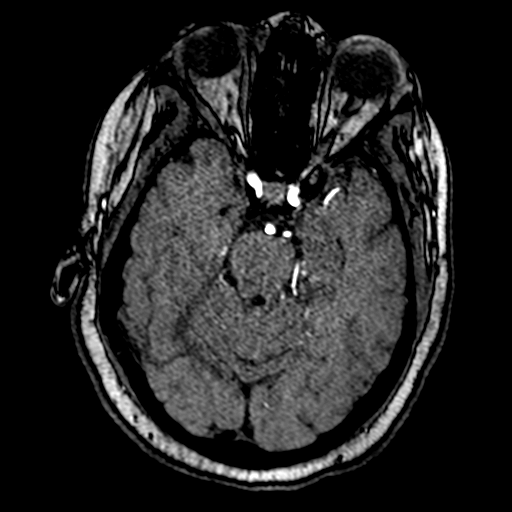
[im 121/172]
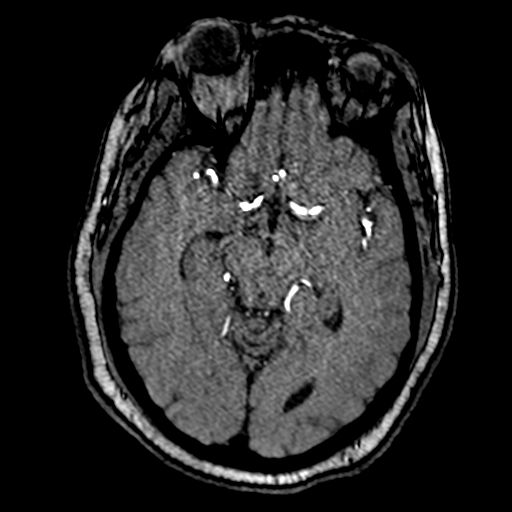
[im 142/172]
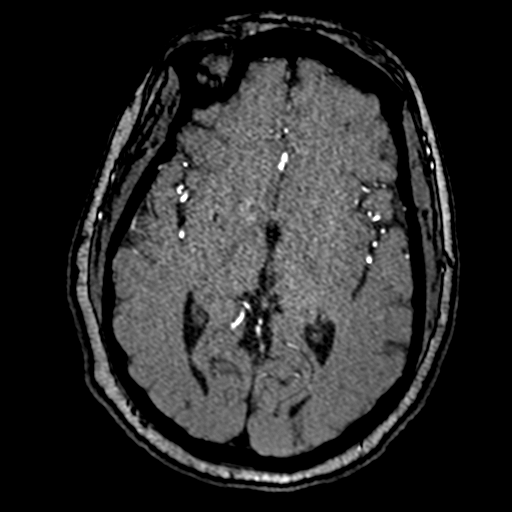
[im 146/172]
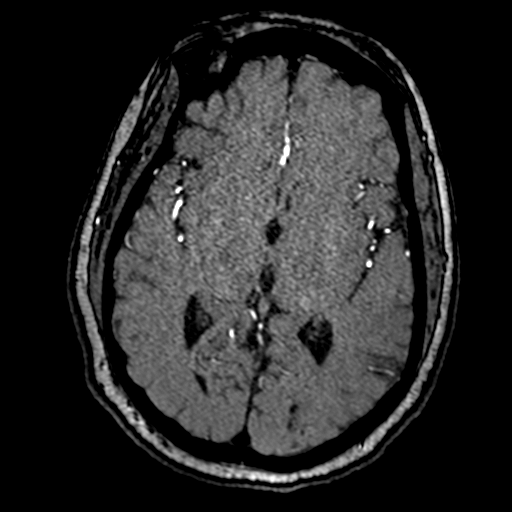
[im 164/172]
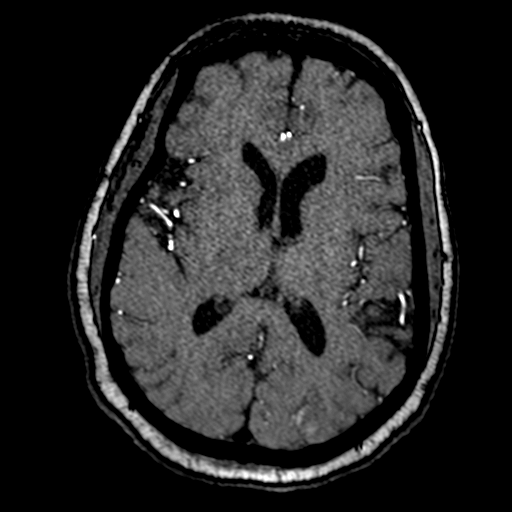

[19 of 48 positions shown; findings below may reference images not displayed]

FINDINGS: POSTERIOR CIRCULATION:

--Vertebral arteries: Normal

--Inferior cerebellar arteries: Normal.

--Basilar artery: Normal.

--Superior cerebellar arteries: Normal.

--Posterior cerebral arteries: Normal.

ANTERIOR CIRCULATION:

--Intracranial internal carotid arteries: Normal.

--Anterior cerebral arteries (ACA): Normal.

--Middle cerebral arteries (MCA): There is short segment occlusion
of the left MCA proximal M2 segments superior division. Moderate
stenosis of the superior division of the right MCA proximal M2
segment.

ANATOMIC VARIANTS: None
IMPRESSION: 1. Short segment occlusion of the left MCA proximal M2 segment
superior division.
2. Moderate stenosis of the superior division of the right MCA
proximal M2 segment.
3. These results were called by telephone at the time of
interpretation on [DATE] at [DATE] to provider AMNON
AMNON , who verbally acknowledged these results. These results
were communicated to Dr. AMNON at [DATE] on [DATE] by
text page via the [employer] messaging system.

## 2020-07-24 MED ORDER — ASPIRIN 81 MG PO CHEW: 81.0000 mg | CHEWABLE_TABLET | Freq: Every day | ORAL | Status: DC

## 2020-07-24 MED ORDER — SODIUM CHLORIDE 0.9 % IV SOLN: INTRAVENOUS | Status: AC

## 2020-07-24 MED ORDER — ASPIRIN 81 MG PO CHEW
81.0000 mg | CHEWABLE_TABLET | Freq: Every day | ORAL | Status: DC
  Administered 2020-07-24 – 2020-07-28 (×5): 81 mg via ORAL
  Filled 2020-07-24 (×5): qty 1

## 2020-07-24 MED ORDER — CLOPIDOGREL BISULFATE 75 MG PO TABS
75.0000 mg | ORAL_TABLET | Freq: Every day | ORAL | Status: DC
  Administered 2020-07-24: 75 mg via ORAL
  Filled 2020-07-24: qty 1

## 2020-07-24 MED ORDER — TICAGRELOR 90 MG PO TABS
90.0000 mg | ORAL_TABLET | Freq: Two times a day (BID) | ORAL | Status: DC
  Administered 2020-07-24 – 2020-07-28 (×8): 90 mg via ORAL
  Filled 2020-07-24 (×9): qty 1

## 2020-07-24 MED ORDER — ACETAZOLAMIDE 250 MG PO TABS
125.0000 mg | ORAL_TABLET | Freq: Three times a day (TID) | ORAL | Status: DC
  Filled 2020-07-24: qty 1

## 2020-07-24 MED ORDER — LOSARTAN POTASSIUM 50 MG PO TABS
100.0000 mg | ORAL_TABLET | Freq: Every day | ORAL | Status: DC
  Administered 2020-07-24 – 2020-07-30 (×6): 100 mg via ORAL
  Filled 2020-07-24 (×6): qty 2

## 2020-07-24 NOTE — Discharge Summary (Incomplete)
   Name: Sharon Blankenship MRN: [id number] DOB: May 16, 1965 55 y.o. PCP: [name], MD  Date of Admission: 07/23/2020 10:53 AM Date of Discharge:  Attending Physician: Sid Falcon, MD  Discharge Diagnosis: 1. Left MCA territory infarct  Discharge Medications: Allergies as of 07/24/2020      Reactions   Aloe Shortness Of Breath, Rash   SOB, Swelling, Rash, Itching   Penicillins Hives, Itching, Swelling   Did it involve swelling of the face/tongue/throat, SOB, or low BP? Y Did it involve sudden or severe rash/hives, skin peeling, or any reaction on the inside of your mouth or nose? N Did you need to seek medical attention at a hospital or doctor's office? Y When did it last happen? Several Years Ago If all above answers are "NO", may proceed with cephalosporin use.    Med Rec must be completed prior to using this [facility]***       Disposition and follow-up:   [name] was discharged from [facility] in {DISCHARGE CONDITION:19696} condition.  At the hospital follow up visit please address:  Left MCA territory infarcts. Had expressive dysphasia on admission which has since resolved.  Discharge medications: INCREASE lipitor to 40mg , START plavix 75mg  daily, continue aspirin 81mg  Follow up recommendations: none; continue to work on mitigating risk factors  Labs / imaging needed at time of follow-up: ***  Pending labs/ test needing follow-up: ***  Follow-up Appointments:   Hospital Course by problem list: 1. ***  Discharge Exam:   BP (!) 163/98   Pulse (!) 104   Temp 98 F (36.7 C) (Oral)   Resp (!) 22   Ht 5\' 7"  (1.702 m)   Wt 113.4 kg   LMP 04/24/2014   SpO2 100%   BMI 39.16 kg/m  Discharge exam: ***  Pertinent Labs, Studies, and Procedures:  ***  Discharge Instructions:   Signed: Mitzi Hansen, MD 07/24/2020, 12:14 PM   Pager: @[phone] @

## 2020-07-24 NOTE — Progress Notes (Signed)
STROKE TEAM PROGRESS NOTE   SUBJECTIVE (INTERVAL HISTORY) Her RN is at the bedside.  Overall her condition is completely resolved.  Patient has stroke in 03/2020 due to left M2 high-grade stenosis.  Currently has another left MCA stroke again likely due to left M2 high-grade stenosis versus short segment occlusion.  Likely need angiogram and potential stenting.  Discussed with patient she was hesitant, she asked me to talk to her sister Chrisal who works in our PACU.  I talked to her over the phone, she is in agreement and she will talk to patient also.  I also discussed with Dr. Estanislado Pandy and he agreed with the plan.   OBJECTIVE Temp:  [98 F (36.7 C)-98.1 F (36.7 C)] 98.1 F (36.7 C) (04/21 1537) Pulse Rate:  [89-104] 99 (04/21 1553) Resp:  [10-23] 16 (04/21 1553) BP: (119-163)/(60-99) 138/78 (04/21 1553) SpO2:  [99 %-100 %] 100 % (04/21 1515)  Recent Labs  Lab 07/24/20 0059 07/24/20 0756 07/24/20 1126  GLUCAP 153* 119* 127*   Recent Labs  Lab 07/23/20 1155 07/23/20 1208  NA 139 142  K 3.8 3.9  CL 115* 116*  CO2 13*  --   GLUCOSE 127* 122*  BUN 17 18  CREATININE 1.14* 1.00  CALCIUM 8.6*  --    Recent Labs  Lab 07/23/20 1155  AST 18  ALT 15  ALKPHOS 124  BILITOT 0.4  PROT 7.0  ALBUMIN 3.5   Recent Labs  Lab 07/23/20 1155 07/23/20 1208  WBC 8.5  --   NEUTROABS 6.1  --   HGB 10.4* 10.5*  HCT 33.4* 31.0*  MCV 85.0  --   PLT 286  --    No results for input(s): CKTOTAL, CKMB, CKMBINDEX, TROPONINI in the last 168 hours. Recent Labs    07/23/20 1155  LABPROT 14.2  INR 1.1   Recent Labs    07/23/20 1444  COLORURINE AMBER*  LABSPEC 1.017  PHURINE 5.0  GLUCOSEU NEGATIVE  HGBUR NEGATIVE  BILIRUBINUR NEGATIVE  KETONESUR NEGATIVE  PROTEINUR NEGATIVE  NITRITE NEGATIVE  LEUKOCYTESUR MODERATE*       Component Value Date/Time   CHOL 108 07/24/2020 0155   CHOL 133 04/24/2020 1501   TRIG 113 07/24/2020 0155   HDL 24 (L) 07/24/2020 0155   HDL 39 (L)  04/24/2020 1501   CHOLHDL 4.5 07/24/2020 0155   VLDL 23 07/24/2020 0155   LDLCALC 61 07/24/2020 0155   LDLCALC 74 04/24/2020 1501   Lab Results  Component Value Date   HGBA1C 7.7 (H) 07/24/2020      Component Value Date/Time   LABOPIA NONE DETECTED 07/23/2020 1444   COCAINSCRNUR NONE DETECTED 07/23/2020 1444   LABBENZ NONE DETECTED 07/23/2020 1444   AMPHETMU NONE DETECTED 07/23/2020 1444   THCU NONE DETECTED 07/23/2020 1444   LABBARB NONE DETECTED 07/23/2020 1444    No results for input(s): ETH in the last 168 hours.  I have personally reviewed the radiological images below and agree with the radiology interpretations.  MR ANGIO HEAD WO CONTRAST  Result Date: 07/24/2020 CLINICAL DATA:  Difficulty speaking EXAM: MRA HEAD WITHOUT CONTRAST TECHNIQUE: Angiographic images of the Circle of Willis were obtained using MRA technique without intravenous contrast. COMPARISON:  Brain MRI 07/23/2020 FINDINGS: POSTERIOR CIRCULATION: --Vertebral arteries: Normal --Inferior cerebellar arteries: Normal. --Basilar artery: Normal. --Superior cerebellar arteries: Normal. --Posterior cerebral arteries: Normal. ANTERIOR CIRCULATION: --Intracranial internal carotid arteries: Normal. --Anterior cerebral arteries (ACA): Normal. --Middle cerebral arteries (MCA): There is short segment occlusion of the left MCA  proximal M2 segments superior division. Moderate stenosis of the superior division of the right MCA proximal M2 segment. ANATOMIC VARIANTS: None IMPRESSION: 1. Short segment occlusion of the left MCA proximal M2 segment superior division. 2. Moderate stenosis of the superior division of the right MCA proximal M2 segment. 3. These results were called by telephone at the time of interpretation on 07/24/2020 at 1:03 am to provider [facility] , who verbally acknowledged these results. These results were communicated to Dr. Kerney Elbe at 1:03 am on 07/24/2020 by text page via the [facility] messaging system.  Electronically Signed   By: Ulyses Jarred M.D.   On: 07/24/2020 01:04   MR BRAIN WO CONTRAST  Result Date: 07/23/2020 CLINICAL DATA:  Neurological deficit.  Acute stroke suspected. EXAM: MRI HEAD WITHOUT CONTRAST TECHNIQUE: Multiplanar, multiecho pulse sequences of the brain and surrounding structures were obtained without intravenous contrast. COMPARISON:  CT 03/22/2020.  MRI 03/21/2020. FINDINGS: Brain: There are several acute infarctions within the left hemisphere extending from the posterior frontal cortical and subcortical brain to the parieto-occipital cortical and subcortical brain. Findings could be watershed or embolic. Mild swelling of the regions of acute infarction. Petechial blood products in the left parieto-occipital region infarctions. Minimal petechial blood products in the largest region of posterior frontal infarction. No focal abnormality affects the brainstem or cerebellum. Right cerebral hemisphere shows a few old small vessel infarctions. Left cerebral hemisphere shows old watershed infarctions which were acute in December of 2021. No hydrocephalus or extra-axial collection. Vascular: Major vessels at the base of the brain show flow. Skull and upper cervical spine: Negative Sinuses/Orbits: Clear/normal Other: None IMPRESSION: 1. Several acute infarctions in the left hemisphere extending from the posterior frontal cortical and subcortical brain to the parieto-occipital cortical and subcortical brain. Findings could be watershed or embolic. Petechial blood products in the largest region of posterior frontal infarction and left parieto-occipital infarction. 2. 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 Electronically Signed   By: Nelson Chimes M.D.   On: 07/23/2020 17:28   VAS [place] ABI WITH/WO TBI  Result Date: 07/24/2020 LOWER EXTREMITY DOPPLER STUDY Indications: LLE "cool" - BLE warm to touch upon examination. High Risk Factors: Hypertension, hyperlipidemia, Diabetes, no history of                     smoking, prior CVA.  Comparison Study: No previous exam Performing Technologist: Hill, Jody RVT, RDMS  Examination Guidelines: A complete evaluation includes at minimum, Doppler waveform signals and systolic blood pressure reading at the level of bilateral brachial, anterior tibial, and posterior tibial arteries, when vessel segments are accessible. Bilateral testing is considered an integral part of a complete examination. Photoelectric Plethysmograph (PPG) waveforms and toe systolic pressure readings are included as required and additional duplex testing as needed. Limited examinations for reoccurring indications may be performed as noted.  ABI Findings: +---------+------------------+-----+---------+---------------+ Right    Rt Pressure (mmHg)IndexWaveform Comment         +---------+------------------+-----+---------+---------------+ Brachial 145                    triphasic                +---------+------------------+-----+---------+---------------+ PTA                             biphasic noncompressible +---------+------------------+-----+---------+---------------+ DP       232  1.60 triphasic                +---------+------------------+-----+---------+---------------+ Great Toe153               1.06 Normal                   +---------+------------------+-----+---------+---------------+ +---------+------------------+-----+---------+---------------------------------+ Left     Lt Pressure (mmHg)IndexWaveform Comment                           +---------+------------------+-----+---------+---------------------------------+ Brachial 143                    biphasic                                   +---------+------------------+-----+---------+---------------------------------+ PTA                             triphasicnoncompressible                   +---------+------------------+-----+---------+---------------------------------+ DP       226                1.56 triphasic                                  +---------+------------------+-----+---------+---------------------------------+ Great Toe101               0.70 Normal   borderline normal to mild small                                            vessel disease                    +---------+------------------+-----+---------+---------------------------------+  Summary: Right: Resting right ankle-brachial index indicates noncompressible right lower extremity arteries. The right toe-brachial index is normal. Left: Resting left ankle-brachial index indicates noncompressible left lower extremity arteries. The left toe-brachial index is normal.  *See table(s) above for measurements and observations.    Preliminary    ECHOCARDIOGRAM COMPLETE  Result Date: 07/24/2020    ECHOCARDIOGRAM REPORT   Patient Name:   RAMONICA GRIGG Date of Exam: 07/24/2020 Medical Rec #:  [id number]     Height:       67.0 in Accession #:    [id number]    Weight:       250.0 lb Date of Birth:  16-Oct-1965     BSA:          2.223 m Patient Age:    10 years      BP:           158/89 mmHg Patient Gender: F             HR:           93 bpm. Exam Location:  Inpatient Procedure: 2D Echo, Cardiac Doppler and Color Doppler Indications:    CVA  History:        Patient has prior history of Echocardiogram examinations, most                 recent 03/23/2020. Stroke; Risk Factors:Hypertension and  Diabetes.  Sonographer:    Luisa Hart RDCS Referring Phys: [id number] Courtney Paris  Sonographer Comments: Patient is morbidly obese. No cardiac surgeries noted on chart IMPRESSIONS  1. Abnormal septal motion . Left ventricular ejection fraction, by estimation, is 55 to 60%. The left ventricle has normal function. The left ventricle has no regional wall motion abnormalities. There is mild left ventricular hypertrophy. Left ventricular diastolic parameters were normal.  2. Right ventricular systolic function is normal.  The right ventricular size is normal. There is normal pulmonary artery systolic pressure.  3. The mitral valve is abnormal. No evidence of mitral valve regurgitation. No evidence of mitral stenosis.  4. The aortic valve is tricuspid. Aortic valve regurgitation is not visualized. Mild to moderate aortic valve sclerosis/calcification is present, without any evidence of aortic stenosis.  5. The inferior vena cava is normal in size with greater than 50% respiratory variability, suggesting right atrial pressure of 3 mmHg. FINDINGS  Left Ventricle: Abnormal septal motion. Left ventricular ejection fraction, by estimation, is 55 to 60%. The left ventricle has normal function. The left ventricle has no regional wall motion abnormalities. The left ventricular internal cavity size was normal in size. There is mild left ventricular hypertrophy. Left ventricular diastolic parameters were normal. Right Ventricle: The right ventricular size is normal. No increase in right ventricular wall thickness. Right ventricular systolic function is normal. There is normal pulmonary artery systolic pressure. The tricuspid regurgitant velocity is 2.48 m/s, and  with an assumed right atrial pressure of 3 mmHg, the estimated right ventricular systolic pressure is 94.7 mmHg. Left Atrium: Left atrial size was normal in size. Right Atrium: Right atrial size was normal in size. Pericardium: There is no evidence of pericardial effusion. Mitral Valve: The mitral valve is abnormal. There is moderate thickening of the mitral valve leaflet(s). There is moderate calcification of the mitral valve leaflet(s). Mild mitral annular calcification. No evidence of mitral valve regurgitation. No evidence of mitral valve stenosis. Tricuspid Valve: The tricuspid valve is normal in structure. Tricuspid valve regurgitation is trivial. No evidence of tricuspid stenosis. Aortic Valve: The aortic valve is tricuspid. Aortic valve regurgitation is not visualized. Mild to  moderate aortic valve sclerosis/calcification is present, without any evidence of aortic stenosis. Aortic valve mean gradient measures 4.0 mmHg. Aortic valve peak gradient measures 8.9 mmHg. Pulmonic Valve: The pulmonic valve was normal in structure. Pulmonic valve regurgitation is trivial. No evidence of pulmonic stenosis. Aorta: The aortic root is normal in size and structure. Venous: The inferior vena cava is normal in size with greater than 50% respiratory variability, suggesting right atrial pressure of 3 mmHg. IAS/Shunts: The interatrial septum was not well visualized.  LEFT VENTRICLE PLAX 2D LVIDd:         3.20 cm     Diastology LVIDs:         2.00 cm     LV e' medial:    5.66 cm/s LV PW:         1.40 cm     LV E/e' medial:  13.9 LV IVS:        1.25 cm     LV e' lateral:   7.72 cm/s                            LV E/e' lateral: 10.2  LV Volumes (MOD) LV vol d, MOD A2C: 51.1 ml LV vol d, MOD A4C: 33.4 ml LV vol s, MOD A2C: 27.4  ml LV vol s, MOD A4C: 15.2 ml LV SV MOD A2C:     23.7 ml LV SV MOD A4C:     33.4 ml LV SV MOD BP:      22.0 ml RIGHT VENTRICLE RV S prime:     11.10 cm/s TAPSE (M-mode): 1.6 cm LEFT ATRIUM             Index       RIGHT ATRIUM          Index LA diam:        2.90 cm 1.30 cm/m  RA Area:     7.14 cm LA Vol (A2C):   25.9 ml 11.65 ml/m RA Volume:   10.20 ml 4.59 ml/m LA Vol (A4C):   32.8 ml 14.75 ml/m LA Biplane Vol: 29.5 ml 13.27 ml/m  AORTIC VALVE                   PULMONIC VALVE AV Vmax:           149.00 cm/s PV Vmax:       0.87 m/s AV Vmean:          95.800 cm/s PV Vmean:      71.200 cm/s AV VTI:            0.238 m     PV VTI:        0.199 m AV Peak Grad:      8.9 mmHg    PV Peak grad:  3.0 mmHg AV Mean Grad:      4.0 mmHg    PV Mean grad:  2.0 mmHg LVOT Vmax:         93.30 cm/s LVOT Vmean:        55.700 cm/s LVOT VTI:          0.147 m LVOT/AV VTI ratio: 0.62  AORTA Ao Root diam: 3.40 cm Ao Asc diam:  3.20 cm MITRAL VALVE                TRICUSPID VALVE MV Area (PHT): 3.83 cm     TR  Peak grad:   24.6 mmHg MV Decel Time: 198 msec     TR Vmax:        248.00 cm/s MV E velocity: 78.80 cm/s MV A velocity: 108.00 cm/s  SHUNTS MV E/A ratio:  0.73         Systemic VTI: 0.15 m Jenkins Rouge MD Electronically signed by Jenkins Rouge MD Signature Date/Time: 07/24/2020/11:57:51 AM    Final    VAS US CAROTID (at [facility] and [facility] only)  Result Date: 07/24/2020 Carotid Arterial Duplex Study Indications:       CVA and Speech disturbance. Risk Factors:      Hypertension, hyperlipidemia, Diabetes, no history of                    smoking, prior CVA. Limitations        Today's exam was limited due to the body habitus of the                    patient. Comparison Study:  No previous exams. CTA performed 03/22/20. Performing Technologist: Rogelia Rohrer  Examination Guidelines: A complete evaluation includes B-mode imaging, spectral Doppler, color Doppler, and power Doppler as needed of all accessible portions of each vessel. Bilateral testing is considered an integral part of a complete examination. Limited examinations for reoccurring indications may be performed as noted.  Right Carotid  Findings: +----------+--------+--------+--------+------------------+------------------+           PSV cm/sEDV cm/sStenosisPlaque DescriptionComments           +----------+--------+--------+--------+------------------+------------------+ CCA Prox  91      18                                                   +----------+--------+--------+--------+------------------+------------------+ CCA Distal62      17                                intimal thickening +----------+--------+--------+--------+------------------+------------------+ ICA Prox  80      30                                                   +----------+--------+--------+--------+------------------+------------------+ ICA Distal57      22                                                    +----------+--------+--------+--------+------------------+------------------+ ECA       92      14                                                   +----------+--------+--------+--------+------------------+------------------+ +----------+--------+-------+----------------+-------------------+           PSV cm/sEDV cmsDescribe        Arm Pressure (mmHG) +----------+--------+-------+----------------+-------------------+ [id number]             Multiphasic, WNL                    +----------+--------+-------+----------------+-------------------+ +---------+--------+--+--------+--+---------+ VertebralPSV cm/s41EDV cm/s12Antegrade +---------+--------+--+--------+--+---------+  Left Carotid Findings: +----------+--------+--------+--------+------------------+------------------+           PSV cm/sEDV cm/sStenosisPlaque DescriptionComments           +----------+--------+--------+--------+------------------+------------------+ CCA Prox  77      14                                intimal thickening +----------+--------+--------+--------+------------------+------------------+ CCA Distal68      14                                intimal thickening +----------+--------+--------+--------+------------------+------------------+ ICA Prox  70      27                                                   +----------+--------+--------+--------+------------------+------------------+ ICA Distal81      33                                                   +----------+--------+--------+--------+------------------+------------------+  ECA       76      11                                                   +----------+--------+--------+--------+------------------+------------------+ +----------+--------+--------+----------------+-------------------+           PSV cm/sEDV cm/sDescribe        Arm Pressure (mmHG) +----------+--------+--------+----------------+-------------------+  [id number]              Multiphasic, WNL                    +----------+--------+--------+----------------+-------------------+ +---------+--------+--+--------+--+---------+ VertebralPSV cm/s58EDV cm/s14Antegrade +---------+--------+--+--------+--+---------+   Summary: Right Carotid: The extracranial vessels were near-normal with only minimal wall                thickening or plaque. Left Carotid: The extracranial vessels were near-normal with only minimal wall               thickening or plaque. Vertebrals:  Bilateral vertebral arteries demonstrate antegrade flow. Subclavians: Normal flow hemodynamics were seen in bilateral subclavian              arteries. *See table(s) above for measurements and observations.     Preliminary     PHYSICAL EXAM  Temp:  [98 F (36.7 C)-98.1 F (36.7 C)] 98.1 F (36.7 C) (04/21 1537) Pulse Rate:  [89-104] 99 (04/21 1553) Resp:  [10-23] 16 (04/21 1553) BP: (119-163)/(60-99) 138/78 (04/21 1553) SpO2:  [99 %-100 %] 100 % (04/21 1515)  General - morbid obesity, well developed, in no apparent distress.  Ophthalmologic - fundi not visualized due to noncooperation.  Cardiovascular - Regular rhythm and rate.  Mental Status -  Level of arousal and orientation to time, place, and person were intact. Language including expression, naming, repetition, comprehension was assessed and found intact.  Cranial Nerves II - XII - II - Visual field intact OU. III, IV, VI - Extraocular movements intact. V - Facial sensation intact bilaterally. VII - Facial movement intact bilaterally. VIII - Hearing & vestibular intact bilaterally. X - Palate elevates symmetrically. XI - Chin turning & shoulder shrug intact bilaterally. XII - Tongue protrusion intact.  Motor Strength - The patient's strength was normal in all extremities and pronator drift was absent.  Bulk was normal and fasciculations were absent.   Motor Tone - Muscle tone was assessed at the neck and  appendages and was normal.  Reflexes - The patient's reflexes were symmetrical in all extremities and she had no pathological reflexes  Sensory - Light touch, temperature/pinprick were assessed and were symmetrical.    Coordination - The patient had normal movements in the hands with no ataxia or dysmetria.  Tremor was absent.  Gait and Station - deferred.   ASSESSMENT/PLAN Ms. Sharon Blankenship is a 55 y.o. 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 No tPA given due to outside window.    Stroke:  left MCA infarct, likely due to left M2 short segment occlusion, large vessel disease source  MRI multifocal left MCA infarcts  MRA left M2 high-grade stenosis versus segmental occlusion  Carotid Doppler unremarkable  2D Echo EF 55 to 60%  Plan for diagnostic angiogram in a.m.  LDL 61  HgbA1c 7.7  Lovenox for VTE prophylaxis  aspirin 81 mg  daily prior to admission, now on aspirin 81 mg daily and Brilinta (ticagrelor) 90 mg bid.  Patient counseled to be compliant with her antithrombotic medications  Ongoing aggressive stroke risk factor management  Therapy recommendations: Pending  Disposition: Pending  History of stroke  03/2020 admitted for right upper extremity numbness weakness, slurred speech and difficulty walking.  CT left frontal infarct.  MRI showed left MCA territory, CR, MCA/ACA and MCA/PCA watershed area infarcts.  MRI showed severe left M2 stenosis.  CTA head and neck left ICA atherosclerosis with plaque, left M2 severe stenosis.  LDL 159, A1c 6.6.  EF 55 to 60%.  Discharged on DAPT and Lipitor.  Open angle glaucoma  Was on Diamox 250 TID  plan for eye surgery 08/12/2020  Diamox (although IV more than po) can cause " reverse Robinhood phenomena" that can worsen left MCA perfusion, current off meds, recommend to hold if OK with ophthalmology until MCA stenting  done  Diabetes  HgbA1c 7.7 goal < 7.0  Uncontrolled  Currently on Lantus  CBG monitoring  SSI  DM education and close PCP follow up  Hypertension . Stable . Avoid low BP  Long term BP goal 130-150 given left M2 high-grade stenosis  Hyperlipidemia  Home meds: Lipitor 20  LDL 61, goal < 70  Now on Lipitor 40  Continue statin at discharge  Other Stroke Risk Factors  Obesity, Body mass index is 39.16 kg/m.   Other Active Problems    Hospital day # 1  I discussed with Dr. Estanislado Pandy and teaching service. I spent  35 minutes in total face-to-face time with the patient, more than 50% of which was spent in counseling and coordination of care, reviewing test results, images and medication, and discussing the diagnosis, treatment plan and potential prognosis. This patient's care requiresreview of multiple databases, neurological assessment, discussion with family, other specialists and medical decision making of high complexity. I had long discussion with patient at bedside and sister on the phone, updated pt current condition, treatment plan and potential prognosis, and answered all the questions.  They expressed understanding and appreciation.    Rosalin Hawking, MD PhD Stroke Neurology 07/24/2020 6:29 PM    To contact Stroke Continuity provider, please refer to [email or web address] After hours, contact General Neurology

## 2020-07-24 NOTE — ED Notes (Signed)
Patient ambulatory to restroom with a strong, steady gait.

## 2020-07-24 NOTE — Progress Notes (Signed)
*  PRELIMINARY RESULTS* Echocardiogram 2D Echocardiogram has been performed.  Luisa Hart 07/24/2020, 11:07 AM

## 2020-07-24 NOTE — Progress Notes (Signed)
Carotid duplex and ABI have been completed.  Results can be found under chart review under CV PROC. 07/24/2020 12:15 PM Clancy Leiner RVT, RDMS

## 2020-07-24 NOTE — ED Notes (Signed)
Pt sleeping on right side. Denies any complaints or needs at this time.

## 2020-07-24 NOTE — ED Notes (Signed)
Patient transported to MRI 

## 2020-07-24 NOTE — ED Notes (Addendum)
Report received from [facility] RN. Patient resting in cart at this time with no complaints. Updated on plan of care and agreeable at this time. VSS and cycling. Will continue to monitor patient until room becomes available. No cognitive deficits noted by this Probation officer.

## 2020-07-24 NOTE — Progress Notes (Signed)
HD#1 Subjective:   Admitted yesterday evening.  This morning, patient states she is feeling much better. States she is feeling less confused and talking better in the sense of not having as much problems getting words out. She reports she noticed after starting the Diamox that her blood pressures were lower. She denies new weakness, dizziness, lightheadedness, numbness, tingling. States she sees podiatry for Charcot's of her right foot.  Objective:   Vital signs in last 24 hours: Vitals:   07/24/20 0140 07/24/20 0325 07/24/20 0400 07/24/20 0600  BP:  120/78 (!) 131/93 (!) 144/82  Pulse: 97 95 (!) 103 91  Resp: 13 18 (!) 23 15  Temp:      TempSrc:      SpO2: 100% 99% 100% 100%  Weight:      Height:       Physical Exam Constitutional: well-appearing woman sitting up in bed, in no acute distress HENT: normocephalic atraumatic, mucous membranes moist Eyes: conjunctiva non-erythematous Cardiovascular: regular rate and rhythm, no m/r/g Pulmonary/Chest: normal work of breathing on room air, lungs clear to auscultation bilaterally Abdominal: soft, non-tender, non-distended Neurological: alert & oriented to person, place, time, and situation; CN II-XII intact except visual fields narrow bilaterally. No pronator drift. No dysmetria with finger-to-nose. 5/5 strength in bilateral upper and lower extremities. Gait assessment deferred. Able to name pen, watch, the color purple.  Skin: warm and dry. DP pulses 2+ bilaterally. Trace pitting edema to the knee of the R lower leg.  Pertinent Labs: CBC Latest Ref Rng & Units 07/23/2020 07/23/2020 06/05/2020  WBC 4.0 - 10.5 K/uL - 8.5 8.7  Hemoglobin 12.0 - 15.0 g/dL 10.5(L) 10.4(L) 11.7  Hematocrit 36.0 - 46.0 % 31.0(L) 33.4(L) 35.8  Platelets 150 - 400 K/uL - 286 332    CMP Latest Ref Rng & Units 07/23/2020 07/23/2020 07/15/2020  Glucose 70 - 99 mg/dL 122(H) 127(H) 143(H)  BUN 6 - 20 mg/dL 18 17 25(H)  Creatinine 0.44 - 1.00 mg/dL 1.00 1.14(H)  1.24(H)  Sodium 135 - 145 mmol/L 142 139 141  Potassium 3.5 - 5.1 mmol/L 3.9 3.8 4.6  Chloride 98 - 111 mmol/L 116(H) 115(H) 108(H)  CO2 22 - 32 mmol/L - 13(L) 15(L)  Calcium 8.9 - 10.3 mg/dL - 8.6(L) 9.3  Total Protein 6.5 - 8.1 g/dL - 7.0 -  Total Bilirubin 0.3 - 1.2 mg/dL - 0.4 -  Alkaline Phos 38 - 126 U/L - 124 -  AST 15 - 41 U/L - 18 -  ALT 0 - 44 U/L - 15 -    Imaging: MR ANGIO HEAD WO CONTRAST  Result Date: 07/24/2020 CLINICAL DATA:  Difficulty speaking EXAM: MRA HEAD WITHOUT CONTRAST TECHNIQUE: Angiographic images of the Circle of Willis were obtained using MRA technique without intravenous contrast. COMPARISON:  Brain MRI 07/23/2020 FINDINGS: POSTERIOR CIRCULATION: --Vertebral arteries: Normal --Inferior cerebellar arteries: Normal. --Basilar artery: Normal. --Superior cerebellar arteries: Normal. --Posterior cerebral arteries: Normal. ANTERIOR CIRCULATION: --Intracranial internal carotid arteries: Normal. --Anterior cerebral arteries (ACA): Normal. --Middle cerebral arteries (MCA): There is short segment occlusion of the left MCA proximal M2 segments superior division. Moderate stenosis of the superior division of the right MCA proximal M2 segment. ANATOMIC VARIANTS: None IMPRESSION: 1. Short segment occlusion of the left MCA proximal M2 segment superior division. 2. Moderate stenosis of the superior division of the right MCA proximal M2 segment. 3. These results were called by telephone at the time of interpretation on 07/24/2020 at 1:03 am to provider [facility] , who  verbally acknowledged these results. These results were communicated to Dr. Kerney Elbe at 1:03 am on 07/24/2020 by text page via the [facility] messaging system. Electronically Signed   By: Ulyses Jarred M.D.   On: 07/24/2020 01:04   MR BRAIN WO CONTRAST  Result Date: 07/23/2020 CLINICAL DATA:  Neurological deficit.  Acute stroke suspected. EXAM: MRI HEAD WITHOUT CONTRAST TECHNIQUE: Multiplanar, multiecho pulse  sequences of the brain and surrounding structures were obtained without intravenous contrast. COMPARISON:  CT 03/22/2020.  MRI 03/21/2020. FINDINGS: Brain: There are several acute infarctions within the left hemisphere extending from the posterior frontal cortical and subcortical brain to the parieto-occipital cortical and subcortical brain. Findings could be watershed or embolic. Mild swelling of the regions of acute infarction. Petechial blood products in the left parieto-occipital region infarctions. Minimal petechial blood products in the largest region of posterior frontal infarction. No focal abnormality affects the brainstem or cerebellum. Right cerebral hemisphere shows a few old small vessel infarctions. Left cerebral hemisphere shows old watershed infarctions which were acute in December of 2021. No hydrocephalus or extra-axial collection. Vascular: Major vessels at the base of the brain show flow. Skull and upper cervical spine: Negative Sinuses/Orbits: Clear/normal Other: None IMPRESSION: 1. Several acute infarctions in the left hemisphere extending from the posterior frontal cortical and subcortical brain to the parieto-occipital cortical and subcortical brain. Findings could be watershed or embolic. Petechial blood products in the largest region of posterior frontal infarction and left parieto-occipital infarction. 2. 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 Electronically Signed   By: Nelson Chimes M.D.   On: 07/23/2020 17:28   VAS [place] ABI WITH/WO TBI  Result Date: 07/24/2020 LOWER EXTREMITY DOPPLER STUDY Indications: LLE "cool" - BLE warm to touch upon examination. High Risk Factors: Hypertension, hyperlipidemia, Diabetes, no history of                    smoking, prior CVA.  Comparison Study: No previous exam Performing Technologist: Hill, Jody RVT, RDMS  Examination Guidelines: A complete evaluation includes at minimum, Doppler waveform signals and systolic blood pressure reading at the level of  bilateral brachial, anterior tibial, and posterior tibial arteries, when vessel segments are accessible. Bilateral testing is considered an integral part of a complete examination. Photoelectric Plethysmograph (PPG) waveforms and toe systolic pressure readings are included as required and additional duplex testing as needed. Limited examinations for reoccurring indications may be performed as noted.  ABI Findings: +---------+------------------+-----+---------+---------------+ Right    Rt Pressure (mmHg)IndexWaveform Comment         +---------+------------------+-----+---------+---------------+ Brachial 145                    triphasic                +---------+------------------+-----+---------+---------------+ PTA                             biphasic noncompressible +---------+------------------+-----+---------+---------------+ DP       232               1.60 triphasic                +---------+------------------+-----+---------+---------------+ Marchelle Gearing               1.06 Normal                   +---------+------------------+-----+---------+---------------+ +---------+------------------+-----+---------+---------------------------------+ Left     Lt Pressure (mmHg)IndexWaveform Comment                           +---------+------------------+-----+---------+---------------------------------+  Brachial 143                    biphasic                                   +---------+------------------+-----+---------+---------------------------------+ PTA                             triphasicnoncompressible                   +---------+------------------+-----+---------+---------------------------------+ DP       226               1.56 triphasic                                  +---------+------------------+-----+---------+---------------------------------+ Great Toe101               0.70 Normal   borderline normal to mild small                                             vessel disease                    +---------+------------------+-----+---------+---------------------------------+  Summary: Right: Resting right ankle-brachial index indicates noncompressible right lower extremity arteries. The right toe-brachial index is normal. Left: Resting left ankle-brachial index indicates noncompressible left lower extremity arteries. The left toe-brachial index is normal.  *See table(s) above for measurements and observations.    Preliminary    ECHOCARDIOGRAM COMPLETE  Result Date: 07/24/2020    ECHOCARDIOGRAM REPORT   Patient Name:   Sharon Blankenship Date of Exam: 07/24/2020 Medical Rec #:  [id number]     Height:       67.0 in Accession #:    [id number]    Weight:       250.0 lb Date of Birth:  December 04, 1965     BSA:          2.223 m Patient Age:    7 years      BP:           158/89 mmHg Patient Gender: F             HR:           93 bpm. Exam Location:  Inpatient Procedure: 2D Echo, Cardiac Doppler and Color Doppler Indications:    CVA  History:        Patient has prior history of Echocardiogram examinations, most                 recent 03/23/2020. Stroke; Risk Factors:Hypertension and                 Diabetes.  Sonographer:    Luisa Hart RDCS Referring Phys: [id number] Courtney Paris  Sonographer Comments: Patient is morbidly obese. No cardiac surgeries noted on chart IMPRESSIONS  1. Abnormal septal motion . Left ventricular ejection fraction, by estimation, is 55 to 60%. The left ventricle has normal function. The left ventricle has no regional wall motion abnormalities. There is mild left ventricular hypertrophy. Left ventricular diastolic parameters were normal.  2. Right ventricular systolic function is normal. The right ventricular size is normal. There is normal  pulmonary artery systolic pressure.  3. The mitral valve is abnormal. No evidence of mitral valve regurgitation. No evidence of mitral stenosis.  4. The aortic valve is tricuspid. Aortic valve  regurgitation is not visualized. Mild to moderate aortic valve sclerosis/calcification is present, without any evidence of aortic stenosis.  5. The inferior vena cava is normal in size with greater than 50% respiratory variability, suggesting right atrial pressure of 3 mmHg. FINDINGS  Left Ventricle: Abnormal septal motion. Left ventricular ejection fraction, by estimation, is 55 to 60%. The left ventricle has normal function. The left ventricle has no regional wall motion abnormalities. The left ventricular internal cavity size was normal in size. There is mild left ventricular hypertrophy. Left ventricular diastolic parameters were normal. Right Ventricle: The right ventricular size is normal. No increase in right ventricular wall thickness. Right ventricular systolic function is normal. There is normal pulmonary artery systolic pressure. The tricuspid regurgitant velocity is 2.48 m/s, and  with an assumed right atrial pressure of 3 mmHg, the estimated right ventricular systolic pressure is 78.5 mmHg. Left Atrium: Left atrial size was normal in size. Right Atrium: Right atrial size was normal in size. Pericardium: There is no evidence of pericardial effusion. Mitral Valve: The mitral valve is abnormal. There is moderate thickening of the mitral valve leaflet(s). There is moderate calcification of the mitral valve leaflet(s). Mild mitral annular calcification. No evidence of mitral valve regurgitation. No evidence of mitral valve stenosis. Tricuspid Valve: The tricuspid valve is normal in structure. Tricuspid valve regurgitation is trivial. No evidence of tricuspid stenosis. Aortic Valve: The aortic valve is tricuspid. Aortic valve regurgitation is not visualized. Mild to moderate aortic valve sclerosis/calcification is present, without any evidence of aortic stenosis. Aortic valve mean gradient measures 4.0 mmHg. Aortic valve peak gradient measures 8.9 mmHg. Pulmonic Valve: The pulmonic valve was normal in  structure. Pulmonic valve regurgitation is trivial. No evidence of pulmonic stenosis. Aorta: The aortic root is normal in size and structure. Venous: The inferior vena cava is normal in size with greater than 50% respiratory variability, suggesting right atrial pressure of 3 mmHg. IAS/Shunts: The interatrial septum was not well visualized.  LEFT VENTRICLE PLAX 2D LVIDd:         3.20 cm     Diastology LVIDs:         2.00 cm     LV e' medial:    5.66 cm/s LV PW:         1.40 cm     LV E/e' medial:  13.9 LV IVS:        1.25 cm     LV e' lateral:   7.72 cm/s                            LV E/e' lateral: 10.2  LV Volumes (MOD) LV vol d, MOD A2C: 51.1 ml LV vol d, MOD A4C: 33.4 ml LV vol s, MOD A2C: 27.4 ml LV vol s, MOD A4C: 15.2 ml LV SV MOD A2C:     23.7 ml LV SV MOD A4C:     33.4 ml LV SV MOD BP:      22.0 ml RIGHT VENTRICLE RV S prime:     11.10 cm/s TAPSE (M-mode): 1.6 cm LEFT ATRIUM             Index       RIGHT ATRIUM          Index LA diam:  2.90 cm 1.30 cm/m  RA Area:     7.14 cm LA Vol (A2C):   25.9 ml 11.65 ml/m RA Volume:   10.20 ml 4.59 ml/m LA Vol (A4C):   32.8 ml 14.75 ml/m LA Biplane Vol: 29.5 ml 13.27 ml/m  AORTIC VALVE                   PULMONIC VALVE AV Vmax:           149.00 cm/s PV Vmax:       0.87 m/s AV Vmean:          95.800 cm/s PV Vmean:      71.200 cm/s AV VTI:            0.238 m     PV VTI:        0.199 m AV Peak Grad:      8.9 mmHg    PV Peak grad:  3.0 mmHg AV Mean Grad:      4.0 mmHg    PV Mean grad:  2.0 mmHg LVOT Vmax:         93.30 cm/s LVOT Vmean:        55.700 cm/s LVOT VTI:          0.147 m LVOT/AV VTI ratio: 0.62  AORTA Ao Root diam: 3.40 cm Ao Asc diam:  3.20 cm MITRAL VALVE                TRICUSPID VALVE MV Area (PHT): 3.83 cm     TR Peak grad:   24.6 mmHg MV Decel Time: 198 msec     TR Vmax:        248.00 cm/s MV E velocity: 78.80 cm/s MV A velocity: 108.00 cm/s  SHUNTS MV E/A ratio:  0.73         Systemic VTI: 0.15 m Jenkins Rouge MD Electronically signed by Jenkins Rouge MD Signature Date/Time: 07/24/2020/11:57:51 AM    Final    VAS US CAROTID (at [facility] and [facility] only)  Result Date: 07/24/2020 Carotid Arterial Duplex Study Indications:       CVA and Speech disturbance. Risk Factors:      Hypertension, hyperlipidemia, Diabetes, no history of                    smoking, prior CVA. Limitations        Today's exam was limited due to the body habitus of the                    patient. Comparison Study:  No previous exams. CTA performed 03/22/20. Performing Technologist: Rogelia Rohrer  Examination Guidelines: A complete evaluation includes B-mode imaging, spectral Doppler, color Doppler, and power Doppler as needed of all accessible portions of each vessel. Bilateral testing is considered an integral part of a complete examination. Limited examinations for reoccurring indications may be performed as noted.  Right Carotid Findings: +----------+--------+--------+--------+------------------+------------------+           PSV cm/sEDV cm/sStenosisPlaque DescriptionComments           +----------+--------+--------+--------+------------------+------------------+ CCA Prox  91      18                                                   +----------+--------+--------+--------+------------------+------------------+ CCA Distal62      17  intimal thickening +----------+--------+--------+--------+------------------+------------------+ ICA Prox  80      30                                                   +----------+--------+--------+--------+------------------+------------------+ ICA Distal57      22                                                   +----------+--------+--------+--------+------------------+------------------+ ECA       92      14                                                   +----------+--------+--------+--------+------------------+------------------+  +----------+--------+-------+----------------+-------------------+           PSV cm/sEDV cmsDescribe        Arm Pressure (mmHG) +----------+--------+-------+----------------+-------------------+ [id number]             Multiphasic, WNL                    +----------+--------+-------+----------------+-------------------+ +---------+--------+--+--------+--+---------+ VertebralPSV cm/s41EDV cm/s12Antegrade +---------+--------+--+--------+--+---------+  Left Carotid Findings: +----------+--------+--------+--------+------------------+------------------+           PSV cm/sEDV cm/sStenosisPlaque DescriptionComments           +----------+--------+--------+--------+------------------+------------------+ CCA Prox  77      14                                intimal thickening +----------+--------+--------+--------+------------------+------------------+ CCA Distal68      14                                intimal thickening +----------+--------+--------+--------+------------------+------------------+ ICA Prox  70      27                                                   +----------+--------+--------+--------+------------------+------------------+ ICA Distal81      33                                                   +----------+--------+--------+--------+------------------+------------------+ ECA       76      11                                                   +----------+--------+--------+--------+------------------+------------------+ +----------+--------+--------+----------------+-------------------+           PSV cm/sEDV cm/sDescribe        Arm Pressure (mmHG) +----------+--------+--------+----------------+-------------------+ [id number]              Multiphasic, WNL                    +----------+--------+--------+----------------+-------------------+ +---------+--------+--+--------+--+---------+  VertebralPSV cm/s58EDV cm/s14Antegrade  +---------+--------+--+--------+--+---------+   Summary: Right Carotid: The extracranial vessels were near-normal with only minimal wall                thickening or plaque. Left Carotid: The extracranial vessels were near-normal with only minimal wall               thickening or plaque. Vertebrals:  Bilateral vertebral arteries demonstrate antegrade flow. Subclavians: Normal flow hemodynamics were seen in bilateral subclavian              arteries. *See table(s) above for measurements and observations.     Preliminary     Assessment/Plan:   Active Problems:   Secondary open-angle glaucoma of both eyes, severe stage   Hypertension   DM (diabetes mellitus), type 2, uncontrolled with complications ([place])   Hyperlipidemia   Stroke ([place])   Mild concentric left ventricular hypertrophy (LVH)   Patient Summary:  Shanice Poznanski is a 55 y.o. 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  Acute CVA, multiple left MCA territory subacute ischemic infarctions 2/2 MCA territory stenosis Hx multiple left MCA territory embolic stroke (36/6440) 2/2 symptomatic MCA territory stenosis and hypertension Patient states she feels back to baseline after initially presenting with difficulty with word finding. No focal deficits on exam. She reports neuropathy and Charcot's foot on the right. MRI head revealed short segment occlusion of the left MCA proximal M2 segment superior division. Moderate stenosis of the superior division of the right MCA proximal M2 segment. Per neurology, given her repeat stroke, this presentation represents medical management failure and patient will likely require stent placement. It is suspected that Diamox could have predisposed to these strokes due to reversed robin hood syndrome (RRHS) since  increased intravascular CO2 causes dilation of healthy vessels, however will lead to steel syndrome if unhealthy vessels do not dilate. Given this, I have reached out to patient's [facility] ophthalmologist and am waiting for call back to discuss safety of continuing to hold Diamox. Regardless, patient will likely require stent placement. Echocardiogram demonstrated normal EF and mild LVH, no suggestion of cardioembolic source. Carotid ultrasound normal.   - Neurology following, appreciate their expertise - Likely stent placement - Holding Diamox, waiting for call back from patient's glaucoma specialist to determine safety of holding vs need to start a different medication - Hemoglobin A1c 7.7, inpatient management as below - PT/OT eval pending - Continue DAPT - Telemetry monitoring - Frequent neurochecks  Essential hypertension Patient's BP have been 110s-150/60s-90s on home losartan 100 mg daily only. Will continue to monitor. - Patient out of window for permissive hypertension - Continue home losartan 100 mg daily - Holding home Diamox 250 mg 3 times daily as above  Type 2 diabetes A1c 7.7.  Current regimen of Tresiba 76 units daily, Ozempic 1 mg daily, metformin 1000 mg twice daily.  Fiasp 18 units daily as needed.  Continue gabapentin 100 mg 3 times daily for nerve pain. -SSI, Lantus 50 units daily during admission  Hyperlipidemia - continue home atorvastatin 20 mg daily.  Lipid panel total cholesterol 108 LDL of 61.  Glaucoma History of glaucoma, on acetazolamide 250 mg 3 times daily. Holding as above. - Contacting patient's glaucoma specialist as above - Continue Xalatan solution both eyes daily, Cosopt 1 drop right eye twice daily, Alphagan 1 drop both  eyes 3 times daily  Cool to touch LLE History of Charcot foot Patient is left lower extremity not cool to touch on my evaluation this morning, and DP pulses 2+ bilaterally. However in setting of patient's DM and report of  Charcot foot, ABIs were obtained and demonstrated noncompressible right and left lower extremity arteries, bilateral normal TBI.   Diet: Heart Healthy IVF: None VTE: Enoxaparin Code: Full PT/OT recs: Pending    Please contact the on call pager after 5 pm and on weekends at [phone].  Alexandria Lodge, MD PGY-1 Internal Medicine Teaching Service Pager: [phone] 07/24/2020

## 2020-07-24 NOTE — ED Notes (Signed)
Pt is A&O X4, does state "things come to my brain a little more slowly at times." Otherwise, no cognitive deficits noted by this observer.

## 2020-07-25 ENCOUNTER — Inpatient Hospital Stay (HOSPITAL_COMMUNITY): Payer: Medicaid Other

## 2020-07-25 DIAGNOSIS — H4053X3 Glaucoma secondary to other eye disorders, bilateral, severe stage: Secondary | ICD-10-CM

## 2020-07-25 DIAGNOSIS — N179 Acute kidney failure, unspecified: Secondary | ICD-10-CM

## 2020-07-25 DIAGNOSIS — Z8673 Personal history of transient ischemic attack (TIA), and cerebral infarction without residual deficits: Secondary | ICD-10-CM

## 2020-07-25 HISTORY — PX: IR ANGIO INTRA EXTRACRAN SEL COM CAROTID INNOMINATE BILAT MOD SED: IMG5360

## 2020-07-25 HISTORY — PX: IR 3D INDEPENDENT WKST: IMG2385

## 2020-07-25 HISTORY — PX: IR US GUIDE VASC ACCESS RIGHT: IMG2390

## 2020-07-25 HISTORY — PX: IR ANGIO VERTEBRAL SEL VERTEBRAL UNI R MOD SED: IMG5368

## 2020-07-25 LAB — CBC WITH DIFFERENTIAL/PLATELET
Abs Immature Granulocytes: 0.02 10*3/uL (ref 0.00–0.07)
Basophils Absolute: 0 10*3/uL (ref 0.0–0.1)
Basophils Relative: 1 %
Eosinophils Absolute: 0.1 10*3/uL (ref 0.0–0.5)
Eosinophils Relative: 2 %
HCT: 30.4 % — ABNORMAL LOW (ref 36.0–46.0)
Hemoglobin: 9.9 g/dL — ABNORMAL LOW (ref 12.0–15.0)
Immature Granulocytes: 0 %
Lymphocytes Relative: 21 %
Lymphs Abs: 1.3 10*3/uL (ref 0.7–4.0)
MCH: 26.5 pg (ref 26.0–34.0)
MCHC: 32.6 g/dL (ref 30.0–36.0)
MCV: 81.3 fL (ref 80.0–100.0)
Monocytes Absolute: 0.6 10*3/uL (ref 0.1–1.0)
Monocytes Relative: 9 %
Neutro Abs: 4 10*3/uL (ref 1.7–7.7)
Neutrophils Relative %: 67 %
Platelets: 256 10*3/uL (ref 150–400)
RBC: 3.74 MIL/uL — ABNORMAL LOW (ref 3.87–5.11)
RDW: 14.5 % (ref 11.5–15.5)
WBC: 6 10*3/uL (ref 4.0–10.5)
nRBC: 0 % (ref 0.0–0.2)

## 2020-07-25 LAB — BASIC METABOLIC PANEL
Anion gap: 9 (ref 5–15)
BUN: 16 mg/dL (ref 6–20)
CO2: 18 mmol/L — ABNORMAL LOW (ref 22–32)
Calcium: 8.9 mg/dL (ref 8.9–10.3)
Chloride: 115 mmol/L — ABNORMAL HIGH (ref 98–111)
Creatinine, Ser: 1.49 mg/dL — ABNORMAL HIGH (ref 0.44–1.00)
GFR, Estimated: 42 mL/min — ABNORMAL LOW (ref >60)
Glucose, Bld: 135 mg/dL — ABNORMAL HIGH (ref 70–99)
Potassium: 3.8 mmol/L (ref 3.5–5.1)
Sodium: 142 mmol/L (ref 135–145)

## 2020-07-25 LAB — GLUCOSE, CAPILLARY
Glucose-Capillary: 165 mg/dL — ABNORMAL HIGH (ref 70–99)
Glucose-Capillary: 120 mg/dL — ABNORMAL HIGH (ref 70–99)
Glucose-Capillary: 89 mg/dL (ref 70–99)
Glucose-Capillary: 117 mg/dL — ABNORMAL HIGH (ref 70–99)

## 2020-07-25 LAB — MRSA PCR SCREENING: MRSA by PCR: NEGATIVE

## 2020-07-25 IMAGING — XA IR ANGIO INTRA EXTRACRAN SEL COM CAROTID INNOMINATE BILAT MOD SE
12 of 16 series · 12 of 24 positions shown · IV contrast (IODINE)
Comparison: CTA of the head and neck of [DATE].

CLINICAL DATA: Intermittent right-sided numbness, tingling and
weakness with expressive aphasia. Severe stenosis of the left middle
cerebral artery on MRA of the head.

EXAM:
BILATERAL COMMON CAROTID AND INNOMINATE ANGIOGRAPHY; IR ULTRASOUND
GUIDANCE VASC ACCESS RIGHT; WORKSTATION 3D RECONSTRUCTION; IR ANGIO
VERTEBRAL SEL VERTEBRAL UNI RIGHT MOD SED
TECHNIQUE: Informed written consent was obtained from the patient after a
thorough discussion of the procedural risks, benefits and
alternatives. All questions were addressed. Maximal Sterile Barrier
Technique was utilized including caps, mask, sterile gowns, sterile
gloves, sterile drape, hand hygiene and skin antiseptic. A timeout
was performed prior to the initiation of the procedure.

[Series 1: cerebral · 1 of 3 slices shown (1 of 12)]
[im 1/3]
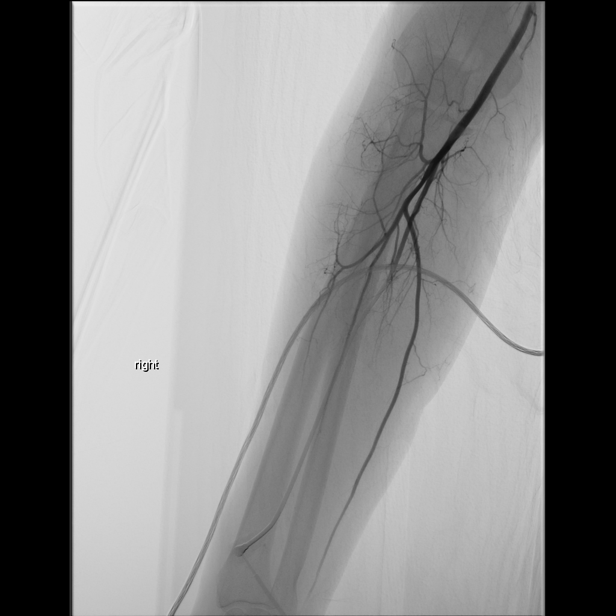

[Series 3: cerebral · 1 of 12 slices shown (2 of 12)]
[im 1/12]
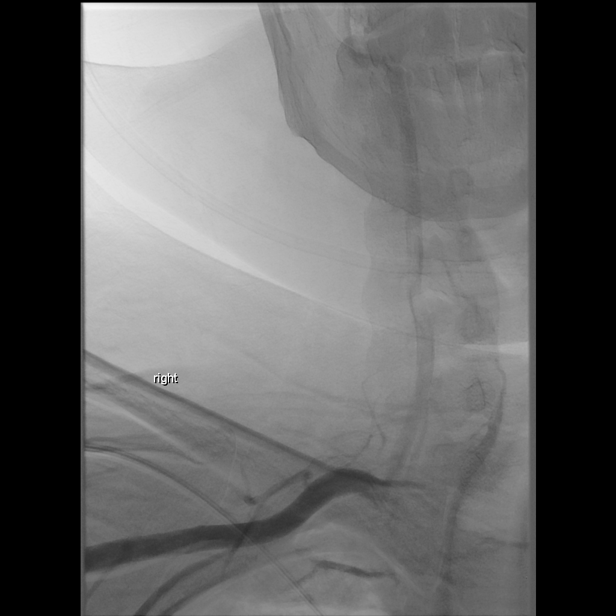

[Series 4: cerebral · 1 of 24 slices shown (3 of 12)]
[im 24/24]
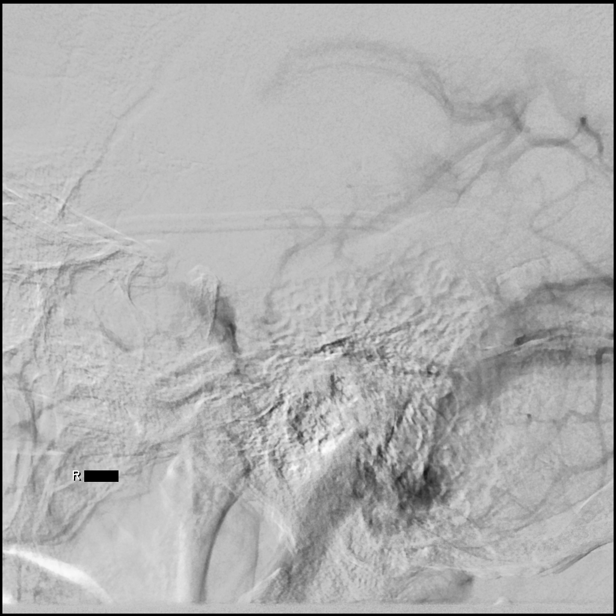

[Series 5: cerebral · 1 of 25 slices shown (4 of 12)]
[im 13/25]
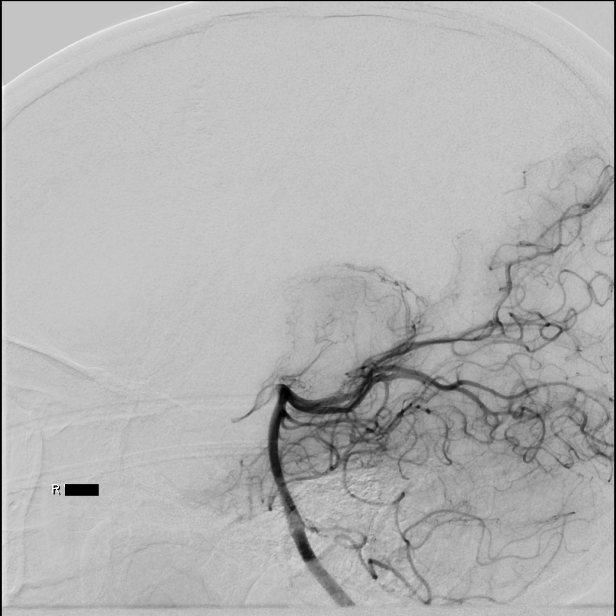

[Series 6: cerebral · 1 of 12 slices shown (5 of 12)]
[im 1/12]
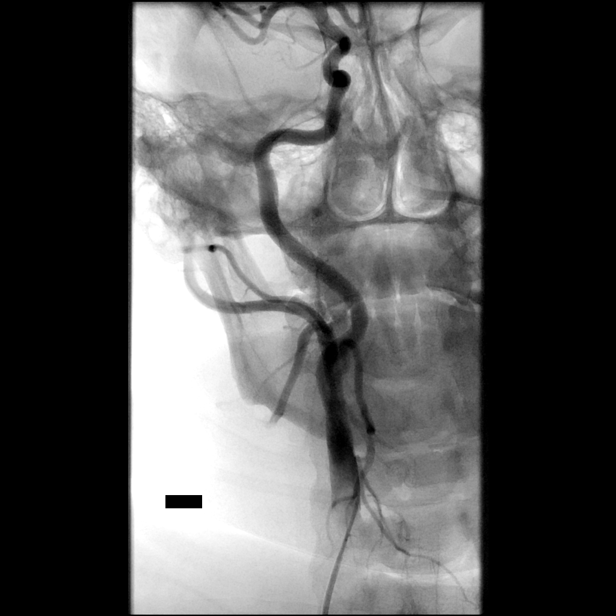

[Series 7: cerebral · 1 of 20 slices shown (6 of 12)]
[im 20/20]
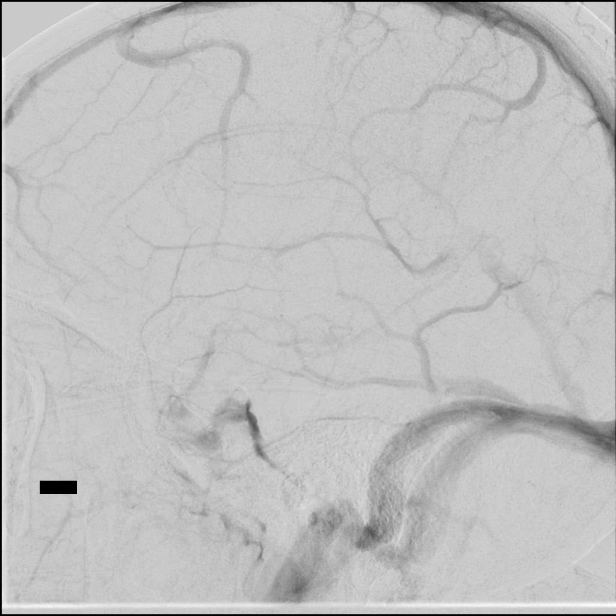

[Series 8: cerebral · 1 of 18 slices shown (7 of 12)]
[im 18/18]
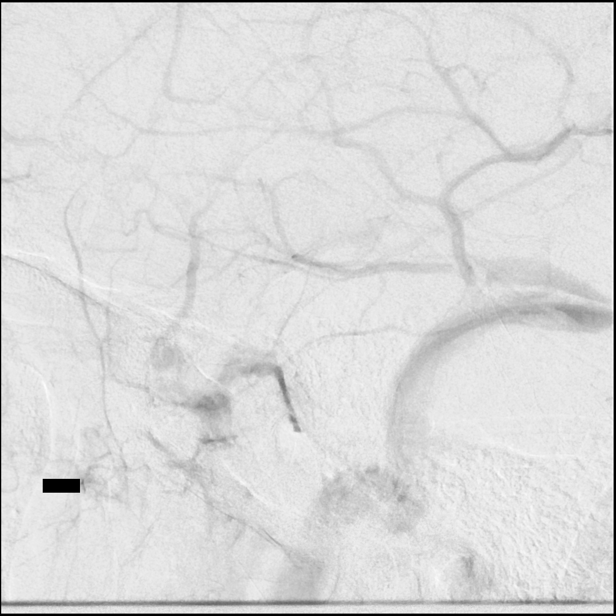

[Series 9: cerebral · 1 of 14 slices shown (8 of 12)]
[im 14/14]
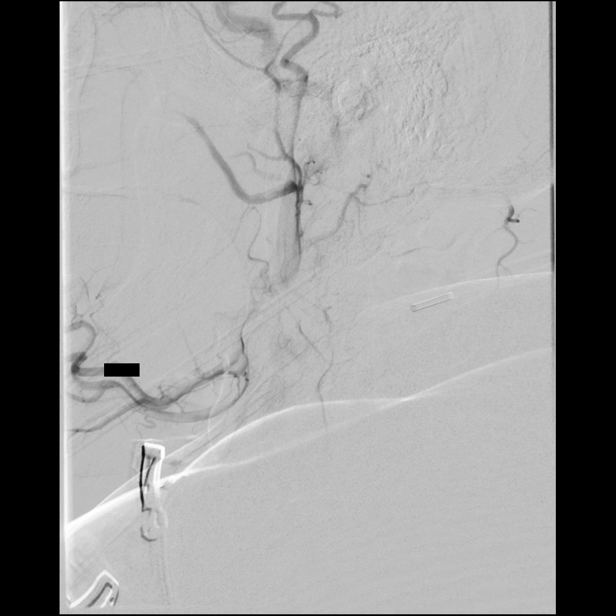

[Series 10: cerebral · 1 of 24 slices shown (9 of 12)]
[im 12/24]
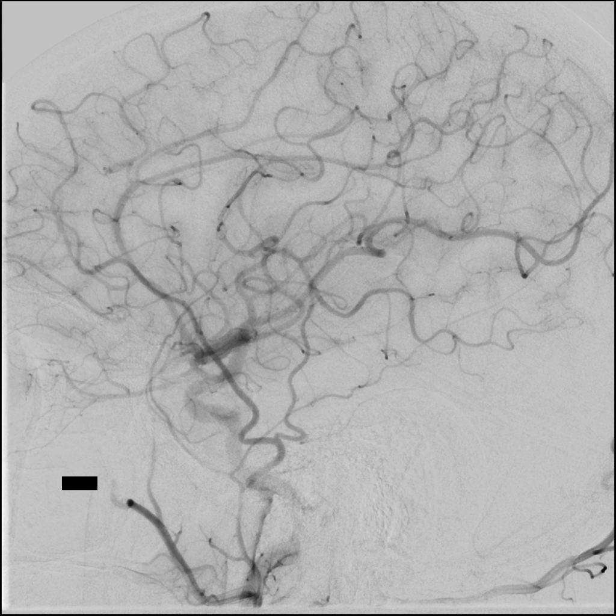

[Series 11: cerebral · 1 of 10 slices shown (10 of 12)]
[im 1/10]
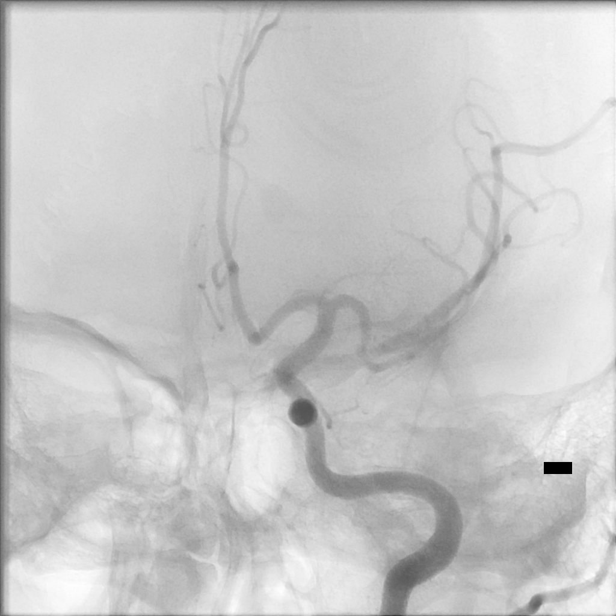

[Series 13: cerebral · 1 of 9 slices shown (11 of 12)]
[im 1/9]
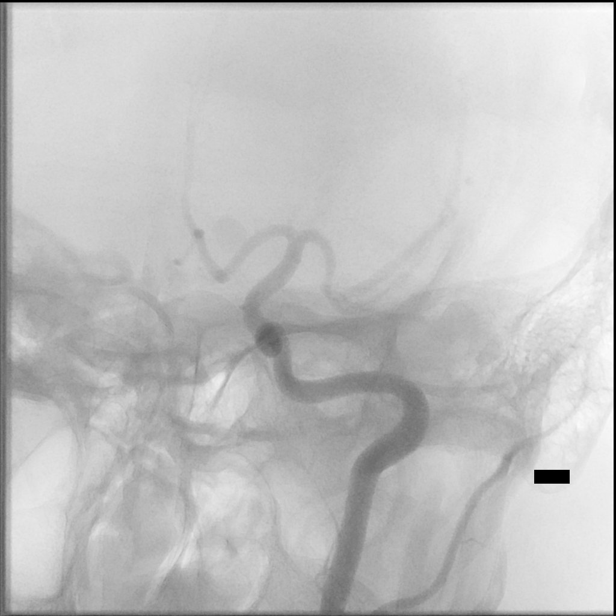

[Series 16: cerebral · 1 of 10 slices shown (12 of 12)]
[im 1/10]
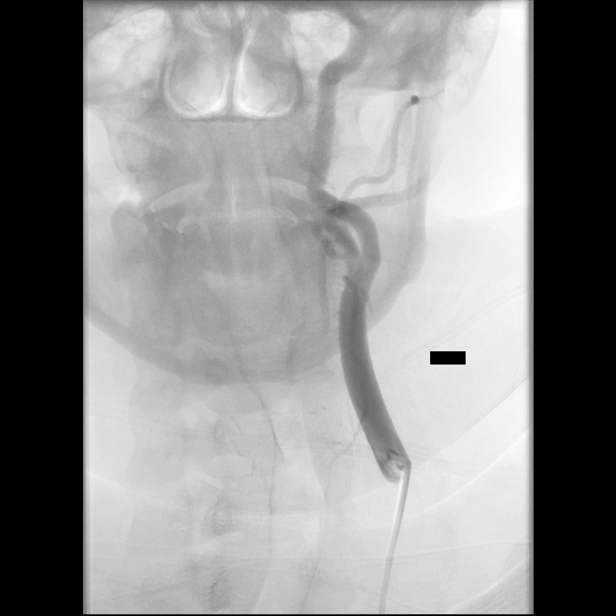

[12 of 24 positions shown; findings below may reference images not displayed]

MEDICATIONS:
Heparin 1,000 units IV. No antibiotic was administered within 1 hour
of the procedure.

ANESTHESIA/SEDATION:
Versed 1 mg IV; Fentanyl 25 mcg IV

Moderate Sedation Time:  44 minutes

The patient was continuously monitored during the procedure by the
interventional radiology nurse under my direct supervision.

CONTRAST:  75 mL Omnipaque 300

FLUOROSCOPY TIME:  Fluoroscopy Time: 9 minutes 6 seconds ([3L] mGy).

COMPLICATIONS:
None immediate.
The right forearm to the wrist was prepped and draped in the usual
sterile manner.

The right radial artery was then identified with ultrasound and its
morphology documented.

A dorsal palmar anastomosis was verified to be present.

Using ultrasound guidance, transradial access was obtained over a
micropuncture set. Over a 0.018 inch guidewire, a [DATE] radial sheath
was then inserted.

The obturator, and micro guidewire were removed. Good aspiration
obtained from the side port of the sheath. A cocktail of 200 mcg of
nitroglycerin, [3L] units of heparin, and 2.5 mg of verapamil was
then infused through the sheath and diluted form without event.

A right radial arteriogram was obtained.

Over a 0.035 inch Roadrunner guidewire, BADILLO 2 diagnostic
catheter was then advanced through the aortic arch and selective
cannulation was performed of the right vertebral artery, the right
common carotid artery and the left common carotid artery. Following
the procedure, hemostasis at the right radial puncture site was
achieved with a wrist band. Distal right radial pulse was verified
to be present.
FINDINGS: Dominant right vertebral origin is widely patent.

The vessel is seen to opacify to the cranial skull base.

Wide patency is seen of the right vertebrobasilar junction, and the
right posterior-inferior cerebellar artery.

A right posteroinferior cerebellar artery/anterior inferior complex
is seen.

The basilar artery, the posterior cerebral arteries, the superior
cerebellar arteries and the anterior-inferior cerebellar arteries
opacify into the capillary and venous phases. Unopacified blood is
seen in the basilar artery from the contralateral vertebral artery.

The left common carotid arteriogram demonstrates the left external
carotid artery and its major branches to be widely patent.

The left internal carotid artery at the bulb has approximately 50%
stenosis without evidence of ulcerations or of intraluminal filling
defects.

The vessel is seen to opacify to the cranial skull base.

The petrous, cavernous and supraclinoid segments are widely patent.

Left anterior cerebral artery opacifies into the capillary and
venous phases.

The left middle cerebral artery demonstrates severe high-grade
stenosis at the origin of the dominant inferior division. This was
verified on a 3D rotational arteriogram with subsequent reformation
on a separate workstation.

Right common carotid arteriogram demonstrates the right external
carotid artery and its major branches to be widely patent.

The right internal carotid artery at the bulb has minimal
arteriosclerotic disease without evidence of ulcerations or of
significant stenosis.

The vessel is seen to opacify to the cranial skull base. The
petrous, cavernous and supraclinoid segments are widely patent.

The right middle cerebral artery demonstrates mild stenosis in its
mid M1 segment. More distally the trifurcation branches opacify into
the capillary and venous phases. The right anterior cerebral artery
opacifies into the capillary and venous phases.
IMPRESSION: Severe high-grade stenosis of the dominant inferior division of the
left middle cerebral artery at its origin.

PLAN:
Findings reviewed with patient and referring stroke BADILLO. Option of
endovascular revascularization versus aggressive medical management
was reviewed in detail. The procedure, alternatives, and potential
complications of new ischemic event and/or hemorrhage of 1-2%, with
potential for neurological deterioration, and death were reviewed
with the patient, and her sister. Patient has decided to proceed
with endovascular revascularization.

## 2020-07-25 MED ORDER — HEPARIN SODIUM (PORCINE) 1000 UNIT/ML IJ SOLN
INTRAMUSCULAR | Status: AC
  Filled 2020-07-25: qty 1

## 2020-07-25 MED ORDER — VERAPAMIL HCL 2.5 MG/ML IV SOLN
INTRAVENOUS | Status: AC | PRN
  Administered 2020-07-25: 2.5 mg via INTRAVENOUS

## 2020-07-25 MED ORDER — FENTANYL CITRATE (PF) 100 MCG/2ML IJ SOLN
INTRAMUSCULAR | Status: AC | PRN
  Administered 2020-07-25: 25 ug via INTRAVENOUS

## 2020-07-25 MED ORDER — IOHEXOL 300 MG/ML  SOLN
100.0000 mL | Freq: Once | INTRAMUSCULAR | Status: AC | PRN
  Administered 2020-07-25: 20 mL via INTRA_ARTERIAL

## 2020-07-25 MED ORDER — VERAPAMIL HCL 2.5 MG/ML IV SOLN
INTRAVENOUS | Status: AC
  Filled 2020-07-25: qty 2

## 2020-07-25 MED ORDER — HEPARIN SODIUM (PORCINE) 1000 UNIT/ML IJ SOLN
INTRAMUSCULAR | Status: AC | PRN
  Administered 2020-07-25: 2000 [IU] via INTRAVENOUS

## 2020-07-25 MED ORDER — MIDAZOLAM HCL 2 MG/2ML IJ SOLN
INTRAMUSCULAR | Status: AC | PRN
  Administered 2020-07-25: 1 mg via INTRAVENOUS

## 2020-07-25 MED ORDER — SODIUM CHLORIDE (PF) 0.9 % IJ SOLN
INTRAVENOUS | Status: AC | PRN
  Administered 2020-07-25: 100 ug via INTRA_ARTERIAL
  Administered 2020-07-25: 200 ug via INTRA_ARTERIAL

## 2020-07-25 MED ORDER — SODIUM CHLORIDE 0.9 % IV SOLN: INTRAVENOUS | Status: AC

## 2020-07-25 MED ORDER — FENTANYL CITRATE (PF) 100 MCG/2ML IJ SOLN
INTRAMUSCULAR | Status: AC
  Filled 2020-07-25: qty 2

## 2020-07-25 MED ORDER — IOHEXOL 300 MG/ML  SOLN
150.0000 mL | Freq: Once | INTRAMUSCULAR | Status: AC | PRN
  Administered 2020-07-25: 75 mL via INTRA_ARTERIAL

## 2020-07-25 MED ORDER — LIDOCAINE HCL 1 % IJ SOLN
INTRAMUSCULAR | Status: AC
  Filled 2020-07-25: qty 20

## 2020-07-25 MED ORDER — NITROGLYCERIN 1 MG/10 ML FOR IR/CATH LAB
INTRA_ARTERIAL | Status: AC
  Filled 2020-07-25: qty 10

## 2020-07-25 MED ORDER — MIDAZOLAM HCL 2 MG/2ML IJ SOLN
INTRAMUSCULAR | Status: AC
  Filled 2020-07-25: qty 2

## 2020-07-25 NOTE — Progress Notes (Signed)
Patient scheduled for cerebral arteriogram with intervention Monday 07/28/20 with anesthesia at 10 am.   IR will see patient and obtain consent Monday morning. Orders will be placed for patient to be NPO at midnight on 07/28/20; AM lab orders placed.  Please call IR with any questions.  Soyla Dryer, [place] [phone] 07/25/2020, 3:11 PM

## 2020-07-25 NOTE — TOC Initial Note (Signed)
Transition of Care [facility]) - Initial/Assessment Note    Patient Details  Name: Sharon Blankenship MRN: [id number] Date of Birth: Mar 24, 1966  Transition of Care [facility]) CM/SW Contact:    Pollie Friar, RN Phone Number: 07/25/2020, 3:45 PM  Clinical Narrative:                 Patient lives at home alone. She uses [employer] for her transportation needs. She sees [facility] for PCP. Recommendations are for [facility] services. Pt having a procedure on Monday. CM will f/u on Monday to see if qualifies for charity [facility].  TOC following.  Expected Discharge Plan: [place] Barriers to Discharge: Continued Medical Work up,Inadequate or no insurance   Patient Goals and CMS Choice   CMS Medicare.gov Compare Post Acute Care list provided to:: Patient Choice offered to / list presented to : Patient  Expected Discharge Plan and Services Expected Discharge Plan: [place]   Discharge Planning Services: CM Consult Post Acute Care Choice: [place] arrangements for the past 2 months: Apartment                           HH Arranged: PT          Prior Living Arrangements/Services Living arrangements for the past 2 months: Apartment Lives with:: Self Patient language and need for interpreter reviewed:: Yes Do you feel safe going back to the place where you live?: Yes            Criminal Activity/Legal Involvement Pertinent to Current Situation/Hospitalization: No - Comment as needed  Activities of Daily Living Home Assistive Devices/Equipment: None ADL Screening (condition at time of admission) Patient's cognitive ability adequate to safely complete daily activities?: Yes Is the patient deaf or have difficulty hearing?: No Does the patient have difficulty seeing, even when wearing glasses/contacts?: Yes Does the patient have difficulty concentrating, remembering, or making decisions?: No Patient able to express need for assistance with ADLs?:  Yes Does the patient have difficulty dressing or bathing?: No Independently performs ADLs?: Yes (appropriate for developmental age) Does the patient have difficulty walking or climbing stairs?: No Weakness of Legs: None Weakness of Arms/Hands: None  Permission Sought/Granted                  Emotional Assessment Appearance:: Appears stated age Attitude/Demeanor/Rapport: Apprehensive Affect (typically observed): Flat Orientation: : Oriented to Self,Oriented to Place,Oriented to  Time,Oriented to Situation   Psych Involvement: No (comment)  Admission diagnosis:  Stroke [facility]) [I63.9] Cerebrovascular accident (CVA), unspecified mechanism ([place]) [I63.9] Difficulty with speech [R47.9] Patient Active Problem List   Diagnosis Date Noted  . Mild concentric left ventricular hypertrophy (LVH) 07/24/2020  . Stroke ([place]) 07/23/2020  . Constipation 07/17/2020  . GERD (gastroesophageal reflux disease) 06/06/2020  . Healthcare maintenance 06/06/2020  . High anion gap metabolic acidosis 98/33/8250  . Sinus tachycardia 05/10/2020  . Secondary open-angle glaucoma of both eyes, severe stage 04/25/2020  . Hypertension 04/25/2020  . DM (diabetes mellitus), type 2, uncontrolled with complications ([place]) 53/97/6734  . Hyperlipidemia 04/25/2020  . CVA (cerebral vascular accident) ([place]) 03/21/2020   PCP:  [name], MD Pharmacy:   [facility] [address], [place] [address] [place] [address] Phone: [phone] Fax: [phone]     Social Determinants of Health (SDOH) Interventions    Readmission Risk Interventions No flowsheet data found.

## 2020-07-25 NOTE — Evaluation (Signed)
Physical Therapy Evaluation Patient Details Name: Sharon Blankenship MRN: [id number] DOB: 1965/04/09 Today's Date: 07/25/2020   History of Present Illness  55 y.o. female presents to [facility] ED on 07/23/2020 with reports of word finding difficulties and headache. 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 MRI on 4/20 demonstrates Several acute infarctions in the left hemisphere extending from the posterior frontal cortical and subcortical brain to the parieto-occipital cortical and subcortical brain. MRA shows left M2 stenosis vs occlusion. PMH includes several acute infarctions in the left hemisphere extending from the posterior frontal cortical and subcortical brain to the parieto-occipital cortical and subcortical brain.  Clinical Impression  Pt demonstrates ability to perform bed mobility and transfers, requiring no physical assistance. Pt displays some mild deficits in balance and dynamic gait tasks, requiring frequent stops to scan environment before proceeding during gait. When challenged with dynamic gait tasks, pt presents with instability and decreased gait speed in stepping over and avoiding obstacles. Pt reports unfamiliar environments normally result in her reduced gait speed due to her chronic visual field deficits. Pt will benefit from acute PT to address balance deficits and improve gait speed for safe ambulation.   Follow Up Recommendations Home health PT    Equipment Recommendations  None recommended by PT    Recommendations for Other Services       Precautions / Restrictions Precautions Precautions: None Restrictions Weight Bearing Restrictions: No      Mobility  Bed Mobility Overal bed mobility: Independent                  Transfers Overall transfer level: Independent Equipment used: None                Ambulation/Gait Ambulation/Gait assistance: Counsellor (Feet): 350 Feet Assistive device: None Gait  Pattern/deviations: Step-through pattern;Decreased stride length;Drifts right/left Gait velocity: reduced Gait velocity interpretation: 1.31 - 2.62 ft/sec, indicative of limited community ambulator General Gait Details: Pt reports reduced speed in unfamiliar environments secondary to chronic visual deficits. Pt states that this is normal for her.  Stairs            Wheelchair Mobility    Modified Rankin (Stroke Patients Only) Modified Rankin (Stroke Patients Only) Pre-Morbid Rankin Score: No significant disability Modified Rankin: No significant disability     Balance Overall balance assessment: Mild deficits observed, not formally tested                                           Pertinent Vitals/Pain Pain Assessment: No/denies pain    Home Living Family/patient expects to be discharged to:: Private residence Living Arrangements: Alone Available Help at Discharge: Family;Available PRN/intermittently Type of Home: Apartment Home Access: Level entry     Home Layout: One level Home Equipment: None      Prior Function Level of Independence: Independent         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     Hand Dominance        Extremity/Trunk Assessment   Upper Extremity Assessment Upper Extremity Assessment: Defer to OT evaluation    Lower Extremity Assessment Lower Extremity Assessment: Overall WFL for tasks assessed    Cervical / Trunk Assessment Cervical / Trunk Assessment: Normal  Communication   Communication: No difficulties  Cognition Arousal/Alertness: Awake/alert Behavior During Therapy: WFL for tasks assessed/performed Overall Cognitive Status:  Within Functional Limits for tasks assessed                                        General Comments General comments (skin integrity, edema, etc.): Pt reports to be independent prior to hospital admission and currently is an Engineer, maintenance. Pt reports ambulating longer  [place]ances [place] prior to chronic visual deficits, but is more limited recently.    Exercises     Assessment/Plan    PT Assessment Patient needs continued PT services  PT Problem List Decreased balance;Decreased mobility       PT Treatment Interventions Gait training;Functional mobility training;Therapeutic activities;Therapeutic exercise;Balance training;Patient/family education    PT Goals (Current goals can be found in the Care Plan section)  Acute Rehab PT Goals Patient Stated Goal: To be able to walk longer distances and return home. PT Goal Formulation: With patient Time For Goal Achievement: 08/08/20 Potential to Achieve Goals: Good Additional Goals Additional Goal #1: Pt will score greater than 19/24 on DGI to improve safety and balance during gait activities.    Frequency Min 4X/week   Barriers to discharge        Co-evaluation               AM-PAC PT "6 Clicks" Mobility  Outcome Measure Help needed turning from your back to your side while in a flat bed without using bedrails?: None Help needed moving from lying on your back to sitting on the side of a flat bed without using bedrails?: None Help needed moving to and from a bed to a chair (including a wheelchair)?: None Help needed standing up from a chair using your arms (e.g., wheelchair or bedside chair)?: None Help needed to walk in hospital room?: A Little Help needed climbing 3-5 steps with a railing? : A Little 6 Click Score: 22    End of Session Equipment Utilized During Treatment: Gait belt Activity Tolerance: Patient tolerated treatment well Patient left: in chair;with call bell/phone within reach Nurse Communication: Mobility status PT Visit Diagnosis: Unsteadiness on feet (R26.81);Other abnormalities of gait and mobility (R26.89);Other symptoms and signs involving the nervous system (R29.898)    Time: 6387-5643 PT Time Calculation (min) (ACUTE ONLY): 16 min   Charges:   PT  Evaluation $PT Eval Low Complexity: 1 Low          Acute Rehab  Pager: [phone]   Garwin Brothers, SPT  07/25/2020, 9:46 AM

## 2020-07-25 NOTE — Evaluation (Signed)
Occupational Therapy Evaluation Patient Details Name: Sharon Blankenship MRN: [id number] DOB: 1965-09-25 Today's Date: 07/25/2020    History of Present Illness 55 y.o. female presents to [facility] ED on 07/23/2020 with reports of word finding difficulties and headache. 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 MRI on 4/20 demonstrates Several acute infarctions in the left hemisphere extending from the posterior frontal cortical and subcortical brain to the parieto-occipital cortical and subcortical brain. MRA shows left M2 stenosis vs occlusion. PMH includes several acute infarctions in the left hemisphere extending from the posterior frontal cortical and subcortical brain to the parieto-occipital cortical and subcortical brain.   Clinical Impression   Pt. Was seen for OT evaluation. Pt. Does not have any residual weakness in UE. Pt. Has history of visual field impairment. Pt. States her visual fields has not changed. Pt. Does scan environment to avoid hazards. Pt. Was able to preform ADLs and amb to commode without assist. Pt. States she feels like she is at baseline. One time evaluation and pt. Will not be seen again.      Follow Up Recommendations  No OT follow up    Equipment Recommendations  None recommended by OT    Recommendations for Other Services       Precautions / Restrictions Precautions Precautions: Fall Precaution Comments: Difficulties with dynamic balance tasks and peripheral visual field deficits. Restrictions Weight Bearing Restrictions: No      Mobility Bed Mobility Overal bed mobility: Independent                  Transfers Overall transfer level: Independent Equipment used: None                  Balance Overall balance assessment:  (Pt. did not have any LOB with mobility in the room)                                         ADL either performed or assessed with clinical judgement   ADL  Overall ADL's : At baseline                                       General ADL Comments: Pt. states she feels able to care for herself.     Vision Baseline Vision/History:  (Pt. has preexisting feild cuts and pt. does scan her environment.) Patient Visual Report: No change from baseline Vision Assessment?: Yes Eye Alignment: Within Functional Limits Ocular Range of Motion: Within Functional Limits Tracking/Visual Pursuits: Able to track stimulus in all quads without difficulty Visual Fields: Right visual field deficit;Left visual field deficit Additional Comments: Pt. states her visual feilds are the same as before.     Perception     Praxis      Pertinent Vitals/Pain Pain Assessment: No/denies pain     Hand Dominance     Extremity/Trunk Assessment Upper Extremity Assessment Upper Extremity Assessment: Overall WFL for tasks assessed   Lower Extremity Assessment Lower Extremity Assessment: Defer to PT evaluation   Cervical / Trunk Assessment Cervical / Trunk Assessment: Normal   Communication Communication Communication: No difficulties   Cognition Arousal/Alertness: Awake/alert Behavior During Therapy: WFL for tasks assessed/performed Overall Cognitive Status: Within Functional Limits for tasks assessed  General Comments  Pt reports to be independent prior to hospital admission and currently is an Engineer, maintenance. Pt reports amb[place] park prior to chronic visual deficits, but is more limited recently.    Exercises     Shoulder Instructions      Home Living Family/patient expects to be discharged to:: Private residence Living Arrangements: Alone Available Help at Discharge: Family;Available PRN/intermittently Type of Home: Apartment Home Access: Level entry     Home Layout: One level     Bathroom Shower/Tub: Teacher, early years/pre: Standard Bathroom  Accessibility: No   Home Equipment: None          Prior Functioning/Environment Level of Independence: Independent        Comments: Pt is a Teacher, music at [employer].        OT Problem List:        OT Treatment/Interventions:      OT Goals(Current goals can be found in the care plan section) Acute Rehab OT Goals Patient Stated Goal: no goals stated  OT Frequency:     Barriers to D/C:            Co-evaluation              AM-PAC OT "6 Clicks" Daily Activity     Outcome Measure Help from another person eating meals?: None Help from another person taking care of personal grooming?: None Help from another person toileting, which includes using toliet, bedpan, or urinal?: None Help from another person bathing (including washing, rinsing, drying)?: None Help from another person to put on and taking off regular upper body clothing?: None Help from another person to put on and taking off regular lower body clothing?: None 6 Click Score: 24   End of Session Nurse Communication:  (ok therapy)  Activity Tolerance: Patient tolerated treatment well Patient left:  (she was taken for testing.)                   Time: 9381-0175 OT Time Calculation (min): 23 min Charges:  OT General Charges $OT Visit: 1 Visit OT Evaluation $OT Eval Low Complexity: 1 Low  Reece Packer OT/L   Sharif Rendell 07/25/2020, 1:07 PM

## 2020-07-25 NOTE — Progress Notes (Signed)
HD#2 Subjective:   No acute events overnight.  This morning, patient states she continues to think and speak clearly, at her baseline. She denies new numbness or weakness. She expresses understanding of the plan to obtain more imaging today.  Objective:   Vital signs in last 24 hours: Vitals:   07/24/20 2127 07/24/20 2317 07/25/20 0406 07/25/20 0727  BP: 140/78 136/86 (!) 142/87 116/65  Pulse: 94 (!) 105 96 85  Resp: 18 17  18   Temp: 99 F (37.2 C) 98.3 F (36.8 C) 97.7 F (36.5 C) 97.8 F (36.6 C)  TempSrc: Oral Oral Oral Oral  SpO2: 100% 98% 100% 100%  Weight:      Height:       Physical Exam Constitutional: well-appearing woman lying in bed, awake and alert, in no acute distress HENT: normocephalic atraumatic, mucous membranes moist Eyes: conjunctiva non-erythematous Cardiovascular: regular rate and rhythm, no m/r/g Pulmonary/Chest: normal work of breathing on room air Neurological: alert & oriented to person, place, time, and situation. 5/5 strength in bilateral upper and lower extremities. Follows and participates in conversation appropriately. Skin: warm and dry  Pertinent Labs: CBC Latest Ref Rng & Units 07/25/2020 07/23/2020 07/23/2020  WBC 4.0 - 10.5 K/uL 6.0 - 8.5  Hemoglobin 12.0 - 15.0 g/dL 9.9(L) 10.5(L) 10.4(L)  Hematocrit 36.0 - 46.0 % 30.4(L) 31.0(L) 33.4(L)  Platelets 150 - 400 K/uL 256 - 286    CMP Latest Ref Rng & Units 07/25/2020 07/23/2020 07/23/2020  Glucose 70 - 99 mg/dL 135(H) 122(H) 127(H)  BUN 6 - 20 mg/dL 16 18 17   Creatinine 0.44 - 1.00 mg/dL 1.49(H) 1.00 1.14(H)  Sodium 135 - 145 mmol/L 142 142 139  Potassium 3.5 - 5.1 mmol/L 3.8 3.9 3.8  Chloride 98 - 111 mmol/L 115(H) 116(H) 115(H)  CO2 22 - 32 mmol/L 18(L) - 13(L)  Calcium 8.9 - 10.3 mg/dL 8.9 - 8.6(L)  Total Protein 6.5 - 8.1 g/dL - - 7.0  Total Bilirubin 0.3 - 1.2 mg/dL - - 0.4  Alkaline Phos 38 - 126 U/L - - 124  AST 15 - 41 U/L - - 18  ALT 0 - 44 U/L - - 15    Imaging: no  new imaging  Assessment/Plan:   Active Problems:   Secondary open-angle glaucoma of both eyes, severe stage   Hypertension   DM (diabetes mellitus), type 2, uncontrolled with complications (HCC)   Hyperlipidemia   Stroke ([place])   Mild concentric left ventricular hypertrophy (LVH)   Patient Summary:  Sharon Blankenship is a 55 y.o. 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  Acute CVA, multiple left MCA territory subacute ischemic infarctions 2/2 MCA territory stenosis Hx multiple left MCA territory embolic stroke (93/2671) 2/2 symptomatic MCA territory stenosis and hypertension Patient remains at her neurologic baseline. Spoke with her glaucoma specialist yesterday who supports holding Diamox as the risk of continuing outweighs the benefit at this time. Plan for image-guided diagnostic cerebral arteriogram today in preparation for likely MCA stenting this hospitalization.  - Neurology following, appreciate their expertise - Diagnostic cerebral arteriogram today, appreciate IR's expertise - Holding Diamox, patient's ophthalmologist agrees risk of continuing outweighs benefit - Long term BP goal is systolic [id number] given left M2 high-grade stenosis - PT/OT eval pending - Continue DAPT - Telemetry monitoring - Frequent neurochecks  Essential hypertension Patient's BP  have been mostly 130s-140s/60s-90s on home losartan 100 mg daily only. Will continue to monitor. - Long term BP goal is systolic [id number] given left M2 high-grade stenosis - Continue home losartan 100 mg daily - Holding home Diamox 250 mg 3 times daily as above  AKI Serum creatinine 1.49 this morning, increased from 1.14 on admission. Baseline appears to be 1-1.2. Patient states she has not been drinking as much fluid as she  should. Suspect pre-renal in setting of mild dehydration. Will increase maintenance fluid rate ahead of arteriogram today and monitor closely. - NS @ 100 cc/hr - AM BMP  Type 2 diabetes A1c 7.7.  Current regimen of Tresiba 76 units daily, Ozempic 1 mg daily, metformin 1000 mg twice daily.  Fiasp 18 units daily as needed.  Continue gabapentin 100 mg 3 times daily for nerve pain. -SSI, Lantus 50 units daily during admission  Hyperlipidemia continue home atorvastatin 20 mg daily.  Lipid panel total cholesterol 108 LDL of 61.  Glaucoma History of glaucoma, on acetazolamide 250 mg 3 times daily. Holding as above. - Continue Xalatan solution both eyes daily, Cosopt 1 drop right eye twice daily, Alphagan 1 drop both eyes 3 times daily   Diet: Heart Healthy IVF: NS @ 100 cc/hr for 24 hrs VTE: Enoxaparin Code: Full PT/OT recs: Pending    Please contact the on call pager after 5 pm and on weekends at [phone].  Alexandria Lodge, MD PGY-1 Internal Medicine Teaching Service Pager: [phone] 07/25/2020

## 2020-07-25 NOTE — Consult Note (Signed)
Chief Complaint: Patient was seen in consultation today for left MCA occlusion.   Referring Physician(s): Dr. Erlinda Hong  Supervising Physician: Luanne Bras  Patient Status: [facility] - In-pt  History of Present Illness: Sharon Blankenship is a 55 y.o. female with a medical history significant for DM, HTN, glaucoma and a stroke December 2021. She presented to the [facility] ED 07/23/20 with complaints of difficulty speaking and intermittent headaches.   MR Angio Head 07/23/20 IMPRESSION: 1. Short segment occlusion of the left MCA proximal M2 segment superior division. 2. Moderate stenosis of the superior division of the right MCA proximal M2 segment.  MR Brain 07/23/20 IMPRESSION: 1. Several acute infarctions in the left hemisphere extending from the posterior frontal cortical and subcortical brain to the parieto-occipital cortical and subcortical brain. Findings could be watershed or embolic. Petechial blood products in the largest region of posterior frontal infarction and left parieto-occipital infarction. 2. 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  Neuro Interventional Radiology has been asked to evaluate this patient for an image-guided diagnostic cerebral arteriogram to further evaluate left MCA occlusion. This case has been reviewed and procedure approved by Dr. Estanislado Pandy.   Past Medical History:  Diagnosis Date  . CVA (cerebral vascular accident) ([place])   . Diabetes mellitus without complication ([place])   . Hypertension   . Open-angle glaucoma     Past Surgical History:  Procedure Laterality Date  . ABDOMINAL HYSTERECTOMY     2016    Allergies: Aloe and Penicillins  Medications: Prior to Admission medications   Medication Sig Start Date End Date Taking? Authorizing Provider  acetaZOLAMIDE (DIAMOX) 125 MG tablet Take 2 tablets (250 mg total) by mouth in the morning, at noon, in the evening, and at bedtime. Patient taking differently: Take 125 mg by mouth in the morning,  at noon, in the evening, and at bedtime. 06/06/20 10/04/20 Yes Christian, Rylee, MD  aspirin 81 MG EC tablet Take 1 tablet (81 mg total) by mouth daily. Swallow whole. Patient taking differently: Take 81 mg by mouth daily. 06/06/20  Yes Christian, Rylee, MD  atorvastatin (LIPITOR) 20 MG tablet Take 1 tablet (20 mg total) by mouth daily. 06/06/20  Yes Christian, Rylee, MD  azelastine (ASTELIN) 0.1 % nasal spray Place 1 spray into both nostrils 2 (two) times daily as needed for allergies. 07/02/20  Yes [provider]  brimonidine (ALPHAGAN) 0.2 % ophthalmic solution Place 1 drop into both eyes 3 (three) times daily. 02/18/20  Yes [provider]  dorzolamide-timolol (COSOPT) 22.3-6.8 MG/ML ophthalmic solution Place 1 drop into the right eye 2 (two) times daily. 02/10/20  Yes [provider]  ferrous sulfate 325 (65 FE) MG tablet Take 325 mg by mouth daily.   Yes [provider]  FIASP FLEXTOUCH 100 UNIT/ML FlexTouch Pen Inject 18 Units into the skin daily as needed (high blood sugar). 03/21/20  Yes [provider]  gabapentin (NEURONTIN) 100 MG capsule Take 1 capsule (100 mg total) by mouth 3 (three) times daily as needed (nerve pain). 06/06/20  Yes Christian, Rylee, MD  latanoprost (XALATAN) 0.005 % ophthalmic solution Place 1 drop into both eyes at bedtime. 02/12/20  Yes [provider]  loratadine (CLARITIN) 10 MG tablet Take 10 mg by mouth daily as needed for allergies. 01/17/20  Yes [provider]  losartan (COZAAR) 100 MG tablet Take 1 tablet (100 mg total) by mouth daily. 06/06/20 06/06/21 Yes Christian, Rylee, MD  meloxicam (MOBIC) 15 MG tablet Take 15 mg by mouth daily  as needed for pain. 01/01/20  Yes [provider]  metFORMIN (GLUCOPHAGE) 1000 MG tablet Take 1 tablet (1,000 mg total) by mouth 2 (two) times daily with a meal. 06/06/20  Yes Christian, Rylee, MD  methocarbamol (ROBAXIN) 500 MG tablet Take 500 mg by mouth 3 (three) times  daily as needed for muscle spasms. 03/04/20  Yes [provider]  montelukast (SINGULAIR) 10 MG tablet TAKE 1 TABLET (10 MG TOTAL) BY MOUTH DAILY. Patient taking differently: Take 10 mg by mouth at bedtime. 04/24/20 04/24/21 Yes [name], MD  Multiple Vitamin (MULTIVITAMIN PO) Take 1 tablet by mouth daily.   Yes [provider]  OZEMPIC, 1 MG/DOSE, 4 MG/3ML SOPN Inject 1 mg into the skin once a week. On Wednesday 06/06/20  Yes Christian, Rylee, MD  pantoprazole (PROTONIX) 40 MG tablet Take 1 tablet (40 mg total) by mouth daily. 07/15/20 07/15/21 Yes [name], MD  polyethylene glycol [facility] Floria Raveling) packet Take 17 g by mouth daily as needed for mild constipation.   Yes [provider]  TRESIBA FLEXTOUCH 200 UNIT/ML FlexTouch Pen INJECT 76 UNITS INTO THE SKIN DAILY. 04/24/20 04/24/21 Yes [name], MD  Insulin Pen Needle 32G X 4 MM MISC USE AS DIRECTED TO INJECT DIABETES MEDICATION 5 TIMES DAILY. 07/15/20 07/15/21  [name], MD     No family history on file.  Social History   Socioeconomic History  . Marital status: Single    Spouse name: Not on file  . Number of children: Not on file  . Years of education: Not on file  . Highest education level: Not on file  Occupational History  . Not on file  Tobacco Use  . Smoking status: Never Smoker  . Smokeless tobacco: Never Used  Substance and Sexual Activity  . Alcohol use: Never  . Drug use: Never  . Sexual activity: Not on file  Other Topics Concern  . Not on file  Social History Narrative   Lives in [place] with roommate. Sister lives close by. 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  All other systems are negative.  Review of Systems  Constitutional: Negative for appetite change and fatigue.  Eyes: Positive for visual disturbance.       History of glaucoma  Respiratory: Negative for cough and shortness of breath.   Gastrointestinal: Negative for abdominal pain, diarrhea, nausea and vomiting.  Musculoskeletal: Negative for back pain.  Neurological: Positive for headaches.    Vital Signs: BP 116/65 (BP Location: Right Arm)   Pulse 85   Temp 97.8 F (36.6 C) (Oral)   Resp 18   Ht 5\' 7"  (1.702 m)   Wt 250 lb (113.4 kg)   LMP 04/24/2014   SpO2 100%   BMI 39.16 kg/m   Physical Exam Constitutional:      General: She is not in acute distress.    Appearance: She is not ill-appearing.  HENT:     Mouth/Throat:     Mouth: Mucous membranes are moist.     Pharynx: Oropharynx is clear.  Cardiovascular:  Rate and Rhythm: Normal rate and regular rhythm.     Pulses: Normal pulses.     Heart sounds: Normal heart sounds.  Pulmonary:     Effort: Pulmonary effort is normal.     Breath sounds: Normal breath sounds.  Abdominal:     General: Bowel sounds are normal.     Palpations: Abdomen is soft.  Neurological:     Mental Status: She is alert and oriented to person, place, and time.     Imaging: MR ANGIO HEAD WO CONTRAST  Result Date: 07/24/2020 CLINICAL DATA:  Difficulty speaking EXAM: MRA HEAD WITHOUT CONTRAST TECHNIQUE: Angiographic images of the Circle of Willis were obtained using MRA technique without intravenous contrast. COMPARISON:  Brain MRI 07/23/2020 FINDINGS: POSTERIOR CIRCULATION: --Vertebral arteries: Normal --Inferior cerebellar arteries: Normal. --Basilar artery: Normal. --Superior cerebellar arteries: Normal. --Posterior cerebral arteries: Normal. ANTERIOR CIRCULATION: --Intracranial internal carotid arteries: Normal. --Anterior cerebral arteries (ACA): Normal. --Middle cerebral  arteries (MCA): There is short segment occlusion of the left MCA proximal M2 segments superior division. Moderate stenosis of the superior division of the right MCA proximal M2 segment. ANATOMIC VARIANTS: None IMPRESSION: 1. Short segment occlusion of the left MCA proximal M2 segment superior division. 2. Moderate stenosis of the superior division of the right MCA proximal M2 segment. 3. These results were called by telephone at the time of interpretation on 07/24/2020 at 1:03 am to provider [facility] , who verbally acknowledged these results. These results were communicated to Dr. Kerney Elbe at 1:03 am on 07/24/2020 by text page via the [facility] messaging system. Electronically Signed   By: Ulyses Jarred M.D.   On: 07/24/2020 01:04   MR BRAIN WO CONTRAST  Result Date: 07/23/2020 CLINICAL DATA:  Neurological deficit.  Acute stroke suspected. EXAM: MRI HEAD WITHOUT CONTRAST TECHNIQUE: Multiplanar, multiecho pulse sequences of the brain and surrounding structures were obtained without intravenous contrast. COMPARISON:  CT 03/22/2020.  MRI 03/21/2020. FINDINGS: Brain: There are several acute infarctions within the left hemisphere extending from the posterior frontal cortical and subcortical brain to the parieto-occipital cortical and subcortical brain. Findings could be watershed or embolic. Mild swelling of the regions of acute infarction. Petechial blood products in the left parieto-occipital region infarctions. Minimal petechial blood products in the largest region of posterior frontal infarction. No focal abnormality affects the brainstem or cerebellum. Right cerebral hemisphere shows a few old small vessel infarctions. Left cerebral hemisphere shows old watershed infarctions which were acute in December of 2021. No hydrocephalus or extra-axial collection. Vascular: Major vessels at the base of the brain show flow. Skull and upper cervical spine: Negative Sinuses/Orbits: Clear/normal Other: None  IMPRESSION: 1. Several acute infarctions in the left hemisphere extending from the posterior frontal cortical and subcortical brain to the parieto-occipital cortical and subcortical brain. Findings could be watershed or embolic. Petechial blood products in the largest region of posterior frontal infarction and left parieto-occipital infarction. 2. 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 Electronically Signed   By: Nelson Chimes M.D.   On: 07/23/2020 17:28   VAS [place] ABI WITH/WO TBI  Result Date: 07/24/2020 LOWER EXTREMITY DOPPLER STUDY Indications: LLE "cool" - BLE warm to touch upon examination. High Risk Factors: Hypertension, hyperlipidemia, Diabetes, no history of                    smoking, prior CVA.  Comparison Study: No previous exam Performing Technologist: Hill, Jody RVT, RDMS  Examination Guidelines: A complete evaluation includes at minimum, Doppler waveform signals and  systolic blood pressure reading at the level of bilateral brachial, anterior tibial, and posterior tibial arteries, when vessel segments are accessible. Bilateral testing is considered an integral part of a complete examination. Photoelectric Plethysmograph (PPG) waveforms and toe systolic pressure readings are included as required and additional duplex testing as needed. Limited examinations for reoccurring indications may be performed as noted.  ABI Findings: +---------+------------------+-----+---------+---------------+ Right    Rt Pressure (mmHg)IndexWaveform Comment         +---------+------------------+-----+---------+---------------+ Brachial 145                    triphasic                +---------+------------------+-----+---------+---------------+ PTA                             biphasic noncompressible +---------+------------------+-----+---------+---------------+ DP       232               1.60 triphasic                +---------+------------------+-----+---------+---------------+ Marchelle Gearing                1.06 Normal                   +---------+------------------+-----+---------+---------------+ +---------+------------------+-----+---------+---------------------------------+ Left     Lt Pressure (mmHg)IndexWaveform Comment                           +---------+------------------+-----+---------+---------------------------------+ Brachial 143                    biphasic                                   +---------+------------------+-----+---------+---------------------------------+ PTA                             triphasicnoncompressible                   +---------+------------------+-----+---------+---------------------------------+ DP       226               1.56 triphasic                                  +---------+------------------+-----+---------+---------------------------------+ Great Toe101               0.70 Normal   borderline normal to mild small                                            vessel disease                    +---------+------------------+-----+---------+---------------------------------+  Summary: Right: Resting right ankle-brachial index indicates noncompressible right lower extremity arteries. The right toe-brachial index is normal. Left: Resting left ankle-brachial index indicates noncompressible left lower extremity arteries. The left toe-brachial index is normal.  *See table(s) above for measurements and observations.  Electronically signed by Harold Barban MD on 07/24/2020 at 9:28:50 PM.   Final    ECHOCARDIOGRAM COMPLETE  Result Date: 07/24/2020    ECHOCARDIOGRAM REPORT   Patient Name:  Sharon Blankenship Date of Exam: 07/24/2020 Medical Rec #:  [id number]     Height:       67.0 in Accession #:    [id number]    Weight:       250.0 lb Date of Birth:  01-13-1966     BSA:          2.223 m Patient Age:    89 years      BP:           158/89 mmHg Patient Gender: F             HR:           93 bpm. Exam Location:  Inpatient Procedure: 2D Echo,  Cardiac Doppler and Color Doppler Indications:    CVA  History:        Patient has prior history of Echocardiogram examinations, most                 recent 03/23/2020. Stroke; Risk Factors:Hypertension and                 Diabetes.  Sonographer:    Luisa Hart RDCS Referring Phys: [id number] Courtney Paris  Sonographer Comments: Patient is morbidly obese. No cardiac surgeries noted on chart IMPRESSIONS  1. Abnormal septal motion . Left ventricular ejection fraction, by estimation, is 55 to 60%. The left ventricle has normal function. The left ventricle has no regional wall motion abnormalities. There is mild left ventricular hypertrophy. Left ventricular diastolic parameters were normal.  2. Right ventricular systolic function is normal. The right ventricular size is normal. There is normal pulmonary artery systolic pressure.  3. The mitral valve is abnormal. No evidence of mitral valve regurgitation. No evidence of mitral stenosis.  4. The aortic valve is tricuspid. Aortic valve regurgitation is not visualized. Mild to moderate aortic valve sclerosis/calcification is present, without any evidence of aortic stenosis.  5. The inferior vena cava is normal in size with greater than 50% respiratory variability, suggesting right atrial pressure of 3 mmHg. FINDINGS  Left Ventricle: Abnormal septal motion. Left ventricular ejection fraction, by estimation, is 55 to 60%. The left ventricle has normal function. The left ventricle has no regional wall motion abnormalities. The left ventricular internal cavity size was normal in size. There is mild left ventricular hypertrophy. Left ventricular diastolic parameters were normal. Right Ventricle: The right ventricular size is normal. No increase in right ventricular wall thickness. Right ventricular systolic function is normal. There is normal pulmonary artery systolic pressure. The tricuspid regurgitant velocity is 2.48 m/s, and  with an assumed right atrial pressure of 3  mmHg, the estimated right ventricular systolic pressure is 0000000 mmHg. Left Atrium: Left atrial size was normal in size. Right Atrium: Right atrial size was normal in size. Pericardium: There is no evidence of pericardial effusion. Mitral Valve: The mitral valve is abnormal. There is moderate thickening of the mitral valve leaflet(s). There is moderate calcification of the mitral valve leaflet(s). Mild mitral annular calcification. No evidence of mitral valve regurgitation. No evidence of mitral valve stenosis. Tricuspid Valve: The tricuspid valve is normal in structure. Tricuspid valve regurgitation is trivial. No evidence of tricuspid stenosis. Aortic Valve: The aortic valve is tricuspid. Aortic valve regurgitation is not visualized. Mild to moderate aortic valve sclerosis/calcification is present, without any evidence of aortic stenosis. Aortic valve mean gradient measures 4.0 mmHg. Aortic valve peak gradient measures 8.9 mmHg. Pulmonic Valve: The pulmonic valve was normal in structure. Pulmonic valve regurgitation is  trivial. No evidence of pulmonic stenosis. Aorta: The aortic root is normal in size and structure. Venous: The inferior vena cava is normal in size with greater than 50% respiratory variability, suggesting right atrial pressure of 3 mmHg. IAS/Shunts: The interatrial septum was not well visualized.  LEFT VENTRICLE PLAX 2D LVIDd:         3.20 cm     Diastology LVIDs:         2.00 cm     LV e' medial:    5.66 cm/s LV PW:         1.40 cm     LV E/e' medial:  13.9 LV IVS:        1.25 cm     LV e' lateral:   7.72 cm/s                            LV E/e' lateral: 10.2  LV Volumes (MOD) LV vol d, MOD A2C: 51.1 ml LV vol d, MOD A4C: 33.4 ml LV vol s, MOD A2C: 27.4 ml LV vol s, MOD A4C: 15.2 ml LV SV MOD A2C:     23.7 ml LV SV MOD A4C:     33.4 ml LV SV MOD BP:      22.0 ml RIGHT VENTRICLE RV S prime:     11.10 cm/s TAPSE (M-mode): 1.6 cm LEFT ATRIUM             Index       RIGHT ATRIUM          Index LA diam:         2.90 cm 1.30 cm/m  RA Area:     7.14 cm LA Vol (A2C):   25.9 ml 11.65 ml/m RA Volume:   10.20 ml 4.59 ml/m LA Vol (A4C):   32.8 ml 14.75 ml/m LA Biplane Vol: 29.5 ml 13.27 ml/m  AORTIC VALVE                   PULMONIC VALVE AV Vmax:           149.00 cm/s PV Vmax:       0.87 m/s AV Vmean:          95.800 cm/s PV Vmean:      71.200 cm/s AV VTI:            0.238 m     PV VTI:        0.199 m AV Peak Grad:      8.9 mmHg    PV Peak grad:  3.0 mmHg AV Mean Grad:      4.0 mmHg    PV Mean grad:  2.0 mmHg LVOT Vmax:         93.30 cm/s LVOT Vmean:        55.700 cm/s LVOT VTI:          0.147 m LVOT/AV VTI ratio: 0.62  AORTA Ao Root diam: 3.40 cm Ao Asc diam:  3.20 cm MITRAL VALVE                TRICUSPID VALVE MV Area (PHT): 3.83 cm     TR Peak grad:   24.6 mmHg MV Decel Time: 198 msec     TR Vmax:        248.00 cm/s MV E velocity: 78.80 cm/s MV A velocity: 108.00 cm/s  SHUNTS MV E/A ratio:  0.73  Systemic VTI: 0.15 m Jenkins Rouge MD Electronically signed by Jenkins Rouge MD Signature Date/Time: 07/24/2020/11:57:51 AM    Final    VAS US CAROTID (at [facility] and [place] only)  Result Date: 07/24/2020 Carotid Arterial Duplex Study Indications:       CVA and Speech disturbance. Risk Factors:      Hypertension, hyperlipidemia, Diabetes, no history of                    smoking, prior CVA. Limitations        Today's exam was limited due to the body habitus of the                    patient. Comparison Study:  No previous exams. CTA performed 03/22/20. Performing Technologist: Rogelia Rohrer  Examination Guidelines: A complete evaluation includes B-mode imaging, spectral Doppler, color Doppler, and power Doppler as needed of all accessible portions of each vessel. Bilateral testing is considered an integral part of a complete examination. Limited examinations for reoccurring indications may be performed as noted.  Right Carotid Findings: +----------+--------+--------+--------+------------------+------------------+            PSV cm/sEDV cm/sStenosisPlaque DescriptionComments           +----------+--------+--------+--------+------------------+------------------+ CCA Prox  91      18                                                   +----------+--------+--------+--------+------------------+------------------+ CCA Distal62      17                                intimal thickening +----------+--------+--------+--------+------------------+------------------+ ICA Prox  80      30                                                   +----------+--------+--------+--------+------------------+------------------+ ICA Distal57      22                                                   +----------+--------+--------+--------+------------------+------------------+ ECA       92      14                                                   +----------+--------+--------+--------+------------------+------------------+ +----------+--------+-------+----------------+-------------------+           PSV cm/sEDV cmsDescribe        Arm Pressure (mmHG) +----------+--------+-------+----------------+-------------------+ [id number]             Multiphasic, WNL                    +----------+--------+-------+----------------+-------------------+ +---------+--------+--+--------+--+---------+ VertebralPSV cm/s41EDV cm/s12Antegrade +---------+--------+--+--------+--+---------+  Left Carotid Findings: +----------+--------+--------+--------+------------------+------------------+           PSV cm/sEDV cm/sStenosisPlaque DescriptionComments           +----------+--------+--------+--------+------------------+------------------+ CCA Prox  77      14                                intimal thickening +----------+--------+--------+--------+------------------+------------------+ CCA Distal68      14                                intimal thickening  +----------+--------+--------+--------+------------------+------------------+ ICA Prox  70      27                                                   +----------+--------+--------+--------+------------------+------------------+ ICA Distal81      33                                                   +----------+--------+--------+--------+------------------+------------------+ ECA       76      11                                                   +----------+--------+--------+--------+------------------+------------------+ +----------+--------+--------+----------------+-------------------+           PSV cm/sEDV cm/sDescribe        Arm Pressure (mmHG) +----------+--------+--------+----------------+-------------------+ [id number]              Multiphasic, WNL                    +----------+--------+--------+----------------+-------------------+ +---------+--------+--+--------+--+---------+ VertebralPSV cm/s58EDV cm/s14Antegrade +---------+--------+--+--------+--+---------+   Summary: Right Carotid: The extracranial vessels were near-normal with only minimal wall                thickening or plaque. Left Carotid: The extracranial vessels were near-normal with only minimal wall               thickening or plaque. Vertebrals:  Bilateral vertebral arteries demonstrate antegrade flow. Subclavians: Normal flow hemodynamics were seen in bilateral subclavian              arteries. *See table(s) above for measurements and observations.  Electronically signed by Harold Barban MD on 07/24/2020 at 9:28:20 PM.    Final     Labs:  CBC: Recent Labs    03/23/20 0739 06/05/20 1520 07/23/20 1155 07/23/20 1208 07/25/20 0530  WBC 5.4 8.7 8.5  --  6.0  HGB 9.8* 11.7 10.4* 10.5* 9.9*  HCT 30.0* 35.8 33.4* 31.0* 30.4*  PLT 255 332 286  --  256    COAGS: Recent Labs    07/23/20 1155  INR 1.1  APTT 36    BMP: Recent Labs    03/23/20 0739 03/23/20 0739 04/24/20 1501  06/05/20 1520 07/15/20 1119 07/23/20 1155 07/23/20 1208 07/25/20 0530  NA 143   < > 140 141 141 139 142 142  K 3.2*  --  4.9 4.5 4.6 3.8 3.9 3.8  CL 114*  --  104 109* 108* 115* 116* 115*  CO2 17*  --  21 14* 15* 13*  --  18*  GLUCOSE 127*   < >  139* 119* 143* 127* 122* 135*  BUN 17   < > 16 19 25* 17 18 16   CALCIUM 9.2  --  9.2 9.6 9.3 8.6*  --  8.9  CREATININE 1.12*  --  1.04* 1.28* 1.24* 1.14* 1.00 1.49*  GFRNONAA 59*  --  62  --   --  58*  --  42*  GFRAA  --   --  71  --   --   --   --   --    < > = values in this interval not displayed.    LIVER FUNCTION TESTS: Recent Labs    03/21/20 1157 06/05/20 1520 07/23/20 1155  BILITOT 0.9 0.3 0.4  AST 29 12 18   ALT 61* 13 15  ALKPHOS 136* 183* 124  PROT 7.6 7.4 7.0  ALBUMIN 3.7 4.6 3.5    TUMOR MARKERS: No results for input(s): AFPTM, CEA, CA199, CHROMGRNA in the last 8760 hours.  Assessment and Plan:  Left MCA occlusion: Sharon Blankenship, 55 year old female, is tentatively scheduled today for an image-guided diagnostic cerebral arteriogram.   Risks and benefits of this procedure were discussed with the patient including, but not limited to bleeding, infection, vascular injury or contrast induced renal failure.  This interventional procedure involves the use of X-rays and because of the nature of the planned procedure, it is possible that we will have prolonged use of X-ray fluoroscopy.  Potential radiation risks to you include (but are not limited to) the following: - A slightly elevated risk for cancer  several years later in life. This risk is typically less than 0.5% percent. This risk is low in comparison to the normal incidence of human cancer, which is 33% for women and 50% for men according to the [place]. - Radiation induced injury can include skin redness, resembling a rash, tissue breakdown / ulcers and hair loss (which can be temporary or permanent).   The likelihood of either of these occurring  depends on the difficulty of the procedure and whether you are sensitive to radiation due to previous procedures, disease, or genetic conditions.   IF your procedure requires a prolonged use of radiation, you will be notified and given written instructions for further action.  It is your responsibility to monitor the irradiated area for the 2 weeks following the procedure and to notify your physician if you are concerned that you have suffered a radiation induced injury.    All of the patient's questions were answered, patient is agreeable to proceed. She has been NPO. Labs and vitals have been reviewed.   Consent signed and in chart.  Thank you for this interesting consult.  I greatly enjoyed meeting Sharon Blankenship and look forward to participating in their care.  A copy of this report was sent to the requesting provider on this date.  Electronically Signed: Soyla Dryer, AGACNP-BC [phone] 07/25/2020, 9:36 AM   I spent a total of 20 Minutes    in face to face in clinical consultation, greater than 50% of which was counseling/coordinating care for image-guided diagnostic cerebral arteriogram.

## 2020-07-25 NOTE — Progress Notes (Signed)
STROKE TEAM PROGRESS NOTE   SUBJECTIVE (INTERVAL HISTORY) Her sister is at the bedside.  Patient had cerebral angiogram with Dr. Fletcher Anon for today and showed severe left MCA superior division stenosis at the origin.  Plan for left MCA stenting next Monday.  Patient and sister are in agreement.   OBJECTIVE Temp:  [97.7 F (36.5 C)-99 F (37.2 C)] 98 F (36.7 C) (04/22 1118) Pulse Rate:  [80-105] 90 (04/22 1830) Cardiac Rhythm: Normal sinus rhythm (04/22 1440) Resp:  [12-18] 18 (04/22 1500) BP: (111-145)/(65-92) 129/77 (04/22 1830) SpO2:  [98 %-100 %] 100 % (04/22 1830)  Recent Labs  Lab 07/24/20 1859 07/24/20 2112 07/25/20 0620 07/25/20 1117 07/25/20 1635  GLUCAP 236* 174* 165* 120* 89   Recent Labs  Lab 07/23/20 1155 07/23/20 1208 07/25/20 0530  NA 139 142 142  K 3.8 3.9 3.8  CL 115* 116* 115*  CO2 13*  --  18*  GLUCOSE 127* 122* 135*  BUN 17 18 16   CREATININE 1.14* 1.00 1.49*  CALCIUM 8.6*  --  8.9   Recent Labs  Lab 07/23/20 1155  AST 18  ALT 15  ALKPHOS 124  BILITOT 0.4  PROT 7.0  ALBUMIN 3.5   Recent Labs  Lab 07/23/20 1155 07/23/20 1208 07/25/20 0530  WBC 8.5  --  6.0  NEUTROABS 6.1  --  4.0  HGB 10.4* 10.5* 9.9*  HCT 33.4* 31.0* 30.4*  MCV 85.0  --  81.3  PLT 286  --  256   No results for input(s): CKTOTAL, CKMB, CKMBINDEX, TROPONINI in the last 168 hours. Recent Labs    07/23/20 1155  LABPROT 14.2  INR 1.1   Recent Labs    07/23/20 1444  COLORURINE AMBER*  LABSPEC 1.017  PHURINE 5.0  GLUCOSEU NEGATIVE  HGBUR NEGATIVE  BILIRUBINUR NEGATIVE  KETONESUR NEGATIVE  PROTEINUR NEGATIVE  NITRITE NEGATIVE  LEUKOCYTESUR MODERATE*       Component Value Date/Time   CHOL 108 07/24/2020 0155   CHOL 133 04/24/2020 1501   TRIG 113 07/24/2020 0155   HDL 24 (L) 07/24/2020 0155   HDL 39 (L) 04/24/2020 1501   CHOLHDL 4.5 07/24/2020 0155   VLDL 23 07/24/2020 0155   LDLCALC 61 07/24/2020 0155   LDLCALC 74 04/24/2020 1501   Lab Results   Component Value Date   HGBA1C 7.7 (H) 07/24/2020      Component Value Date/Time   LABOPIA NONE DETECTED 07/23/2020 1444   COCAINSCRNUR NONE DETECTED 07/23/2020 1444   LABBENZ NONE DETECTED 07/23/2020 1444   AMPHETMU NONE DETECTED 07/23/2020 1444   THCU NONE DETECTED 07/23/2020 1444   LABBARB NONE DETECTED 07/23/2020 1444    No results for input(s): ETH in the last 168 hours.  I have personally reviewed the radiological images below and agree with the radiology interpretations.  MR ANGIO HEAD WO CONTRAST  Result Date: 07/24/2020 CLINICAL DATA:  Difficulty speaking EXAM: MRA HEAD WITHOUT CONTRAST TECHNIQUE: Angiographic images of the Circle of Willis were obtained using MRA technique without intravenous contrast. COMPARISON:  Brain MRI 07/23/2020 FINDINGS: POSTERIOR CIRCULATION: --Vertebral arteries: Normal --Inferior cerebellar arteries: Normal. --Basilar artery: Normal. --Superior cerebellar arteries: Normal. --Posterior cerebral arteries: Normal. ANTERIOR CIRCULATION: --Intracranial internal carotid arteries: Normal. --Anterior cerebral arteries (ACA): Normal. --Middle cerebral arteries (MCA): There is short segment occlusion of the left MCA proximal M2 segments superior division. Moderate stenosis of the superior division of the right MCA proximal M2 segment. ANATOMIC VARIANTS: None IMPRESSION: 1. Short segment occlusion of the left MCA proximal  M2 segment superior division. 2. Moderate stenosis of the superior division of the right MCA proximal M2 segment. 3. These results were called by telephone at the time of interpretation on 07/24/2020 at 1:03 am to provider [facility] , who verbally acknowledged these results. These results were communicated to Dr. Kerney Elbe at 1:03 am on 07/24/2020 by text page via the [facility] messaging system. Electronically Signed   By: Ulyses Jarred M.D.   On: 07/24/2020 01:04   MR BRAIN WO CONTRAST  Result Date: 07/23/2020 CLINICAL DATA:  Neurological  deficit.  Acute stroke suspected. EXAM: MRI HEAD WITHOUT CONTRAST TECHNIQUE: Multiplanar, multiecho pulse sequences of the brain and surrounding structures were obtained without intravenous contrast. COMPARISON:  CT 03/22/2020.  MRI 03/21/2020. FINDINGS: Brain: There are several acute infarctions within the left hemisphere extending from the posterior frontal cortical and subcortical brain to the parieto-occipital cortical and subcortical brain. Findings could be watershed or embolic. Mild swelling of the regions of acute infarction. Petechial blood products in the left parieto-occipital region infarctions. Minimal petechial blood products in the largest region of posterior frontal infarction. No focal abnormality affects the brainstem or cerebellum. Right cerebral hemisphere shows a few old small vessel infarctions. Left cerebral hemisphere shows old watershed infarctions which were acute in December of 2021. No hydrocephalus or extra-axial collection. Vascular: Major vessels at the base of the brain show flow. Skull and upper cervical spine: Negative Sinuses/Orbits: Clear/normal Other: None IMPRESSION: 1. Several acute infarctions in the left hemisphere extending from the posterior frontal cortical and subcortical brain to the parieto-occipital cortical and subcortical brain. Findings could be watershed or embolic. Petechial blood products in the largest region of posterior frontal infarction and left parieto-occipital infarction. 2. 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 Electronically Signed   By: Nelson Chimes M.D.   On: 07/23/2020 17:28   VAS [place] ABI WITH/WO TBI  Result Date: 07/24/2020 LOWER EXTREMITY DOPPLER STUDY Indications: LLE "cool" - BLE warm to touch upon examination. High Risk Factors: Hypertension, hyperlipidemia, Diabetes, no history of                    smoking, prior CVA.  Comparison Study: No previous exam Performing Technologist: Hill, Jody RVT, RDMS  Examination Guidelines: A complete  evaluation includes at minimum, Doppler waveform signals and systolic blood pressure reading at the level of bilateral brachial, anterior tibial, and posterior tibial arteries, when vessel segments are accessible. Bilateral testing is considered an integral part of a complete examination. Photoelectric Plethysmograph (PPG) waveforms and toe systolic pressure readings are included as required and additional duplex testing as needed. Limited examinations for reoccurring indications may be performed as noted.  ABI Findings: +---------+------------------+-----+---------+---------------+ Right    Rt Pressure (mmHg)IndexWaveform Comment         +---------+------------------+-----+---------+---------------+ Brachial 145                    triphasic                +---------+------------------+-----+---------+---------------+ PTA                             biphasic noncompressible +---------+------------------+-----+---------+---------------+ DP       232               1.60 triphasic                +---------+------------------+-----+---------+---------------+ Marchelle Gearing  1.06 Normal                   +---------+------------------+-----+---------+---------------+ +---------+------------------+-----+---------+---------------------------------+ Left     Lt Pressure (mmHg)IndexWaveform Comment                           +---------+------------------+-----+---------+---------------------------------+ Brachial 143                    biphasic                                   +---------+------------------+-----+---------+---------------------------------+ PTA                             triphasicnoncompressible                   +---------+------------------+-----+---------+---------------------------------+ DP       226               1.56 triphasic                                  +---------+------------------+-----+---------+---------------------------------+  Great Toe101               0.70 Normal   borderline normal to mild small                                            vessel disease                    +---------+------------------+-----+---------+---------------------------------+  Summary: Right: Resting right ankle-brachial index indicates noncompressible right lower extremity arteries. The right toe-brachial index is normal. Left: Resting left ankle-brachial index indicates noncompressible left lower extremity arteries. The left toe-brachial index is normal.  *See table(s) above for measurements and observations.  Electronically signed by Harold Barban MD on 07/24/2020 at 9:28:50 PM.   Final    ECHOCARDIOGRAM COMPLETE  Result Date: 07/24/2020    ECHOCARDIOGRAM REPORT   Patient Name:   THERESSA PIEDRA Date of Exam: 07/24/2020 Medical Rec #:  [id number]     Height:       67.0 in Accession #:    [id number]    Weight:       250.0 lb Date of Birth:  1965-11-25     BSA:          2.223 m Patient Age:    16 years      BP:           158/89 mmHg Patient Gender: F             HR:           93 bpm. Exam Location:  Inpatient Procedure: 2D Echo, Cardiac Doppler and Color Doppler Indications:    CVA  History:        Patient has prior history of Echocardiogram examinations, most                 recent 03/23/2020. Stroke; Risk Factors:Hypertension and                 Diabetes.  Sonographer:    [place] Referring Phys: [id number] Courtney Paris  Sonographer Comments: Patient is  morbidly obese. No cardiac surgeries noted on chart IMPRESSIONS  1. Abnormal septal motion . Left ventricular ejection fraction, by estimation, is 55 to 60%. The left ventricle has normal function. The left ventricle has no regional wall motion abnormalities. There is mild left ventricular hypertrophy. Left ventricular diastolic parameters were normal.  2. Right ventricular systolic function is normal. The right ventricular size is normal. There is normal pulmonary artery systolic  pressure.  3. The mitral valve is abnormal. No evidence of mitral valve regurgitation. No evidence of mitral stenosis.  4. The aortic valve is tricuspid. Aortic valve regurgitation is not visualized. Mild to moderate aortic valve sclerosis/calcification is present, without any evidence of aortic stenosis.  5. The inferior vena cava is normal in size with greater than 50% respiratory variability, suggesting right atrial pressure of 3 mmHg. FINDINGS  Left Ventricle: Abnormal septal motion. Left ventricular ejection fraction, by estimation, is 55 to 60%. The left ventricle has normal function. The left ventricle has no regional wall motion abnormalities. The left ventricular internal cavity size was normal in size. There is mild left ventricular hypertrophy. Left ventricular diastolic parameters were normal. Right Ventricle: The right ventricular size is normal. No increase in right ventricular wall thickness. Right ventricular systolic function is normal. There is normal pulmonary artery systolic pressure. The tricuspid regurgitant velocity is 2.48 m/s, and  with an assumed right atrial pressure of 3 mmHg, the estimated right ventricular systolic pressure is 0000000 mmHg. Left Atrium: Left atrial size was normal in size. Right Atrium: Right atrial size was normal in size. Pericardium: There is no evidence of pericardial effusion. Mitral Valve: The mitral valve is abnormal. There is moderate thickening of the mitral valve leaflet(s). There is moderate calcification of the mitral valve leaflet(s). Mild mitral annular calcification. No evidence of mitral valve regurgitation. No evidence of mitral valve stenosis. Tricuspid Valve: The tricuspid valve is normal in structure. Tricuspid valve regurgitation is trivial. No evidence of tricuspid stenosis. Aortic Valve: The aortic valve is tricuspid. Aortic valve regurgitation is not visualized. Mild to moderate aortic valve sclerosis/calcification is present, without any evidence  of aortic stenosis. Aortic valve mean gradient measures 4.0 mmHg. Aortic valve peak gradient measures 8.9 mmHg. Pulmonic Valve: The pulmonic valve was normal in structure. Pulmonic valve regurgitation is trivial. No evidence of pulmonic stenosis. Aorta: The aortic root is normal in size and structure. Venous: The inferior vena cava is normal in size with greater than 50% respiratory variability, suggesting right atrial pressure of 3 mmHg. IAS/Shunts: The interatrial septum was not well visualized.  LEFT VENTRICLE PLAX 2D LVIDd:         3.20 cm     Diastology LVIDs:         2.00 cm     LV e' medial:    5.66 cm/s LV PW:         1.40 cm     LV E/e' medial:  13.9 LV IVS:        1.25 cm     LV e' lateral:   7.72 cm/s                            LV E/e' lateral: 10.2  LV Volumes (MOD) LV vol d, MOD A2C: 51.1 ml LV vol d, MOD A4C: 33.4 ml LV vol s, MOD A2C: 27.4 ml LV vol s, MOD A4C: 15.2 ml LV SV MOD A2C:     23.7 ml LV SV  MOD A4C:     33.4 ml LV SV MOD BP:      22.0 ml RIGHT VENTRICLE RV S prime:     11.10 cm/s TAPSE (M-mode): 1.6 cm LEFT ATRIUM             Index       RIGHT ATRIUM          Index LA diam:        2.90 cm 1.30 cm/m  RA Area:     7.14 cm LA Vol (A2C):   25.9 ml 11.65 ml/m RA Volume:   10.20 ml 4.59 ml/m LA Vol (A4C):   32.8 ml 14.75 ml/m LA Biplane Vol: 29.5 ml 13.27 ml/m  AORTIC VALVE                   PULMONIC VALVE AV Vmax:           149.00 cm/s PV Vmax:       0.87 m/s AV Vmean:          95.800 cm/s PV Vmean:      71.200 cm/s AV VTI:            0.238 m     PV VTI:        0.199 m AV Peak Grad:      8.9 mmHg    PV Peak grad:  3.0 mmHg AV Mean Grad:      4.0 mmHg    PV Mean grad:  2.0 mmHg LVOT Vmax:         93.30 cm/s LVOT Vmean:        55.700 cm/s LVOT VTI:          0.147 m LVOT/AV VTI ratio: 0.62  AORTA Ao Root diam: 3.40 cm Ao Asc diam:  3.20 cm MITRAL VALVE                TRICUSPID VALVE MV Area (PHT): 3.83 cm     TR Peak grad:   24.6 mmHg MV Decel Time: 198 msec     TR Vmax:        248.00 cm/s  MV E velocity: 78.80 cm/s MV A velocity: 108.00 cm/s  SHUNTS MV E/A ratio:  0.73         Systemic VTI: 0.15 m Jenkins Rouge MD Electronically signed by Jenkins Rouge MD Signature Date/Time: 07/24/2020/11:57:51 AM    Final    VAS US CAROTID (at [facility] and [facility] only)  Result Date: 07/24/2020 Carotid Arterial Duplex Study Indications:       CVA and Speech disturbance. Risk Factors:      Hypertension, hyperlipidemia, Diabetes, no history of                    smoking, prior CVA. Limitations        Today's exam was limited due to the body habitus of the                    patient. Comparison Study:  No previous exams. CTA performed 03/22/20. Performing Technologist: Rogelia Rohrer  Examination Guidelines: A complete evaluation includes B-mode imaging, spectral Doppler, color Doppler, and power Doppler as needed of all accessible portions of each vessel. Bilateral testing is considered an integral part of a complete examination. Limited examinations for reoccurring indications may be performed as noted.  Right Carotid Findings: +----------+--------+--------+--------+------------------+------------------+           PSV cm/sEDV cm/sStenosisPlaque DescriptionComments           +----------+--------+--------+--------+------------------+------------------+  CCA Prox  91      18                                                   +----------+--------+--------+--------+------------------+------------------+ CCA Distal62      17                                intimal thickening +----------+--------+--------+--------+------------------+------------------+ ICA Prox  80      30                                                   +----------+--------+--------+--------+------------------+------------------+ ICA Distal57      22                                                   +----------+--------+--------+--------+------------------+------------------+ ECA       92      14                                                    +----------+--------+--------+--------+------------------+------------------+ +----------+--------+-------+----------------+-------------------+           PSV cm/sEDV cmsDescribe        Arm Pressure (mmHG) +----------+--------+-------+----------------+-------------------+ [id number]             Multiphasic, WNL                    +----------+--------+-------+----------------+-------------------+ +---------+--------+--+--------+--+---------+ VertebralPSV cm/s41EDV cm/s12Antegrade +---------+--------+--+--------+--+---------+  Left Carotid Findings: +----------+--------+--------+--------+------------------+------------------+           PSV cm/sEDV cm/sStenosisPlaque DescriptionComments           +----------+--------+--------+--------+------------------+------------------+ CCA Prox  77      14                                intimal thickening +----------+--------+--------+--------+------------------+------------------+ CCA Distal68      14                                intimal thickening +----------+--------+--------+--------+------------------+------------------+ ICA Prox  70      27                                                   +----------+--------+--------+--------+------------------+------------------+ ICA Distal81      33                                                   +----------+--------+--------+--------+------------------+------------------+ ECA       76  11                                                   +----------+--------+--------+--------+------------------+------------------+ +----------+--------+--------+----------------+-------------------+           PSV cm/sEDV cm/sDescribe        Arm Pressure (mmHG) +----------+--------+--------+----------------+-------------------+ [id number]              Multiphasic, WNL                    +----------+--------+--------+----------------+-------------------+  +---------+--------+--+--------+--+---------+ VertebralPSV cm/s58EDV cm/s14Antegrade +---------+--------+--+--------+--+---------+   Summary: Right Carotid: The extracranial vessels were near-normal with only minimal wall                thickening or plaque. Left Carotid: The extracranial vessels were near-normal with only minimal wall               thickening or plaque. Vertebrals:  Bilateral vertebral arteries demonstrate antegrade flow. Subclavians: Normal flow hemodynamics were seen in bilateral subclavian              arteries. *See table(s) above for measurements and observations.  Electronically signed by Harold Barban MD on 07/24/2020 at 9:28:20 PM.    Final     PHYSICAL EXAM  Temp:  [97.7 F (36.5 C)-99 F (37.2 C)] 98 F (36.7 C) (04/22 1118) Pulse Rate:  [80-105] 90 (04/22 1830) Resp:  [12-18] 18 (04/22 1500) BP: (111-145)/(65-92) 129/77 (04/22 1830) SpO2:  [98 %-100 %] 100 % (04/22 1830)  General - morbid obesity, well developed, in no apparent distress.  Ophthalmologic - fundi not visualized due to noncooperation.  Cardiovascular - Regular rhythm and rate.  Mental Status -  Level of arousal and orientation to time, place, and person were intact. Language including expression, naming, repetition, comprehension was assessed and found intact.  Cranial Nerves II - XII - II - Visual field intact OU. III, IV, VI - Extraocular movements intact. V - Facial sensation intact bilaterally. VII - Facial movement intact bilaterally. VIII - Hearing & vestibular intact bilaterally. X - Palate elevates symmetrically. XI - Chin turning & shoulder shrug intact bilaterally. XII - Tongue protrusion intact.  Motor Strength - The patient's strength was normal in all extremities and pronator drift was absent.  Bulk was normal and fasciculations were absent.   Motor Tone - Muscle tone was assessed at the neck and appendages and was normal.  Reflexes - The patient's reflexes were  symmetrical in all extremities and she had no pathological reflexes  Sensory - Light touch, temperature/pinprick were assessed and were symmetrical.    Coordination - The patient had normal movements in the hands with no ataxia or dysmetria.  Tremor was absent.  Gait and Station - deferred.   ASSESSMENT/PLAN Ms. Tamila Gaulin is a 55 y.o. 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 No tPA given due to outside window.    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  LDL 61  HgbA1c 7.7  P2Y12 pending in a.m.  Lovenox for VTE prophylaxis  aspirin 81 mg daily prior to admission, now on aspirin 81 mg daily and Brilinta (ticagrelor) 90 mg bid.  Patient counseled to be compliant with her antithrombotic medications  Ongoing aggressive stroke risk factor management  Therapy recommendations: Pending  Disposition: Pending  History of stroke  03/2020 admitted for right upper extremity numbness weakness, slurred speech and difficulty walking.  CT left frontal infarct.  MRI showed left MCA territory, CR, MCA/ACA and MCA/PCA watershed area infarcts.  MRI showed severe left M2 stenosis.  CTA head and neck left ICA atherosclerosis with plaque, left M2 severe stenosis.  LDL 159, A1c 6.6.  EF 55 to 60%.  Discharged on DAPT and Lipitor.  Open angle glaucoma  Was on Diamox 250 TID  plan for eye surgery 08/12/2020  Diamox (although IV more than po) can cause " reverse Robinhood phenomena" that can worsen left MCA perfusion, current off meds, recommend to hold if OK with ophthalmology until MCA stenting done  Diamox was  currently on hold, okay to continue after MCA stenting  Diabetes  HgbA1c 7.7 goal < 7.0  Uncontrolled  Currently on Lantus  CBG monitoring  SSI  DM education and close PCP follow up  Hypertension . Stable . Avoid low BP  Long term BP goal 130-150 given left M2 high-grade stenosis.  Long-term BP goal normotensive after MCA stenting.  Hyperlipidemia  Home meds: Lipitor 20  LDL 61, goal < 70  Now on Lipitor 40  Continue statin at discharge  Other Stroke Risk Factors  Obesity, Body mass index is 39.16 kg/m.   Other [facility] day # 2  Neurology will follow up again on Monday.   Rosalin Hawking, MD PhD Stroke Neurology 07/25/2020 7:37 PM    To contact Stroke Continuity provider, please refer to [email or web address] After hours, contact General Neurology

## 2020-07-25 NOTE — Procedures (Signed)
S/P bilateral common carotid and RT [facility] angiograms. RT  Rad approach. Findings. 1.Severe Lt MCA superior division stenosis at origin. S.Jung Ingerson MD

## 2020-07-25 NOTE — Sedation Documentation (Signed)
Pt transported to 3w04 via bed accompanied by transport and RN. Haylie RN at bedside to receive pt. Handoff completed. Right wrist remains level 0. Pt awake alert and oriented. No s/s of distress at this time.

## 2020-07-26 LAB — CBC WITH DIFFERENTIAL/PLATELET
Abs Immature Granulocytes: 0.03 10*3/uL (ref 0.00–0.07)
Basophils Absolute: 0 10*3/uL (ref 0.0–0.1)
Basophils Relative: 1 %
Eosinophils Absolute: 0.1 10*3/uL (ref 0.0–0.5)
Eosinophils Relative: 2 %
HCT: 28.1 % — ABNORMAL LOW (ref 36.0–46.0)
Hemoglobin: 9.3 g/dL — ABNORMAL LOW (ref 12.0–15.0)
Immature Granulocytes: 0 %
Lymphocytes Relative: 28 %
Lymphs Abs: 2 10*3/uL (ref 0.7–4.0)
MCH: 26.5 pg (ref 26.0–34.0)
MCHC: 33.1 g/dL (ref 30.0–36.0)
MCV: 80.1 fL (ref 80.0–100.0)
Monocytes Absolute: 0.5 10*3/uL (ref 0.1–1.0)
Monocytes Relative: 6 %
Neutro Abs: 4.6 10*3/uL (ref 1.7–7.7)
Neutrophils Relative %: 63 %
Platelets: 256 10*3/uL (ref 150–400)
RBC: 3.51 MIL/uL — ABNORMAL LOW (ref 3.87–5.11)
RDW: 14.8 % (ref 11.5–15.5)
WBC: 7.3 10*3/uL (ref 4.0–10.5)
nRBC: 0 % (ref 0.0–0.2)

## 2020-07-26 LAB — BASIC METABOLIC PANEL
Anion gap: 7 (ref 5–15)
BUN: 12 mg/dL (ref 6–20)
CO2: 17 mmol/L — ABNORMAL LOW (ref 22–32)
Calcium: 8.2 mg/dL — ABNORMAL LOW (ref 8.9–10.3)
Chloride: 117 mmol/L — ABNORMAL HIGH (ref 98–111)
Creatinine, Ser: 1.17 mg/dL — ABNORMAL HIGH (ref 0.44–1.00)
GFR, Estimated: 56 mL/min — ABNORMAL LOW (ref >60)
Glucose, Bld: 120 mg/dL — ABNORMAL HIGH (ref 70–99)
Potassium: 3.2 mmol/L — ABNORMAL LOW (ref 3.5–5.1)
Sodium: 141 mmol/L (ref 135–145)

## 2020-07-26 LAB — GLUCOSE, CAPILLARY
Glucose-Capillary: 107 mg/dL — ABNORMAL HIGH (ref 70–99)
Glucose-Capillary: 118 mg/dL — ABNORMAL HIGH (ref 70–99)
Glucose-Capillary: 129 mg/dL — ABNORMAL HIGH (ref 70–99)
Glucose-Capillary: 180 mg/dL — ABNORMAL HIGH (ref 70–99)

## 2020-07-26 LAB — PLATELET INHIBITION P2Y12: Platelet Function  P2Y12: 25 [PRU] — ABNORMAL LOW (ref 182–335)

## 2020-07-26 MED ORDER — POTASSIUM CHLORIDE CRYS ER 20 MEQ PO TBCR
40.0000 meq | EXTENDED_RELEASE_TABLET | Freq: Two times a day (BID) | ORAL | Status: AC
  Administered 2020-07-26 (×2): 40 meq via ORAL
  Filled 2020-07-26 (×2): qty 2

## 2020-07-26 NOTE — Progress Notes (Signed)
HD#3 Subjective:   No acute events overnight.  During evaluation this morning, patient states she is feeling well. States she is trying to talk more about everything that has been happening with her health so as to come to terms with it. She would like to start journaling her thoughts. She feels she has to be the strong one in her family. She denies new numbness or weakness. She expresses understanding of the plan for stent placement on Monday.   Objective:   Vital signs in last 24 hours: Vitals:   07/25/20 2130 07/25/20 2230 07/25/20 2331 07/26/20 0400  BP: (!) 145/89 (!) 150/75 133/68 134/72  Pulse: 88 87 87 90  Resp:  18    Temp: 98.4 F (36.9 C) 98.4 F (36.9 C) 98.3 F (36.8 C) 98.3 F (36.8 C)  TempSrc:   Oral Oral  SpO2: 100% 100% 100% 100%  Weight:      Height:       Physical Exam Constitutional: well-appearing woman lying in bed, awake and alert, in no acute distress HENT: normocephalic atraumatic, mucous membranes moist Cardiovascular: regular rate and rhythm, no m/r/g Pulmonary/Chest: normal work of breathing on room air Neurological: alert & oriented to person, place, time, and situation. 5/5 strength in bilateral upper. Follows and participates in conversation appropriately. Skin: warm and dry  Pertinent Labs: CBC Latest Ref Rng & Units 07/26/2020 07/25/2020 07/23/2020  WBC 4.0 - 10.5 K/uL 7.3 6.0 -  Hemoglobin 12.0 - 15.0 g/dL 9.3(L) 9.9(L) 10.5(L)  Hematocrit 36.0 - 46.0 % 28.1(L) 30.4(L) 31.0(L)  Platelets 150 - 400 K/uL 256 256 -    CMP Latest Ref Rng & Units 07/26/2020 07/25/2020 07/23/2020  Glucose 70 - 99 mg/dL 120(H) 135(H) 122(H)  BUN 6 - 20 mg/dL 12 16 18   Creatinine 0.44 - 1.00 mg/dL 1.17(H) 1.49(H) 1.00  Sodium 135 - 145 mmol/L 141 142 142  Potassium 3.5 - 5.1 mmol/L 3.2(L) 3.8 3.9  Chloride 98 - 111 mmol/L 117(H) 115(H) 116(H)  CO2 22 - 32 mmol/L 17(L) 18(L) -  Calcium 8.9 - 10.3 mg/dL 8.2(L) 8.9 -  Total Protein 6.5 - 8.1 g/dL - - -  Total  Bilirubin 0.3 - 1.2 mg/dL - - -  Alkaline Phos 38 - 126 U/L - - -  AST 15 - 41 U/L - - -  ALT 0 - 44 U/L - - -    Imaging: no new imaging  Assessment/Plan:   Active Problems:   Secondary open-angle glaucoma of both eyes, severe stage   Hypertension   DM (diabetes mellitus), type 2, uncontrolled with complications (HCC)   Hyperlipidemia   Stroke ([place])   Mild concentric left ventricular hypertrophy (LVH)   Patient Summary:  Sharon Blankenship is a 55 y.o. 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  Acute CVA, multiple left MCA territory subacute ischemic infarctions 2/2 MCA territory stenosis Hx multiple left MCA territory embolic stroke (14/4315) 2/2 symptomatic MCA territory stenosis and hypertension Patient remains at her neurologic baseline. Cerebral arteriogram with Dr. Estanislado Pandy yesterday showed severe left MCA superior division stenosis at the origin. Plan for left MCA stenting Monday (4/25).  - Neurology following, appreciate their expertise - Dr. Estanislado Pandy to perform MCA stenting Monday (4/25)  - BP goal is systolic [id number] given left M2 high-grade stenosis. Long-term BP goal normotensive after MCA stenting. -  No OT follow-up, PT recommend HHPT - Continue DAPT (aspirin 81 mg daily and Brilinta 90 mg BID), P2Y12 25 - Telemetry monitoring - Frequent neurochecks  Essential hypertension Patient's BP have been mostly 130s-150s/60s-90s on home losartan 100 mg daily only. Will continue to monitor. - BP goal is systolic [id number] given left M2 high-grade stenosis. Long-term BP goal normotensive after MCA stenting. - Continue home losartan 100 mg daily - Holding home Diamox 250 mg 3 times daily as above, okay to continue after MCA stenting  AKI, improved Serum creatinine improved  from 1.49 yesterday > 1.17 this morning after gentle hydration yesterday, stable from 1.14 on admission. Baseline appears to be 1-1.2. Suspect pre-renal in setting of mild dehydration. Will continue to monitor especially given arteriogram yesterday.  - encourage PO hydration - AM BMP  Type 2 diabetes A1c 7.7.  Current regimen of Tresiba 76 units daily, Ozempic 1 mg daily, metformin 1000 mg twice daily.  Fiasp 18 units daily as needed.  Continue gabapentin 100 mg 3 times daily for nerve pain. -SSI, Lantus 50 units daily during admission  Hyperlipidemia continue home atorvastatin 20 mg daily.  Lipid panel total cholesterol 108 LDL of 61.  Glaucoma History of glaucoma, on acetazolamide 250 mg 3 times daily. Holding as above. - Continue Xalatan solution both eyes daily, Cosopt 1 drop right eye twice daily, Alphagan 1 drop both eyes 3 times daily - Resume Diamox after MCA stenting   Diet: Heart Healthy IVF: none VTE: Enoxaparin Code: Full PT/OT recs: HHPT, no OT follow-up needed    Please contact the on call pager after 5 pm and on weekends at [phone].  Alexandria Lodge, MD PGY-1 Internal Medicine Teaching Service Pager: [phone] 07/26/2020

## 2020-07-26 NOTE — Progress Notes (Signed)
STROKE TEAM PROGRESS NOTE   SUBJECTIVE (INTERVAL HISTORY)  No acute events overnight.  Afebrile, VSS. Neuro exam: Alert and orientated to time, place, and person. Language including expression, naming, repetition, comprehension was assessed and found intact. The patient's strength was normal in all extremities and pronator drift was absent.   Patient had cerebral angiogram with Dr. Estanislado Pandy yesterday and showed severe left MCA superior division stenosis at the origin.  Plan for left MCA stenting next Monday.     OBJECTIVE Temp:  [98.3 F (36.8 C)-98.6 F (37 C)] 98.5 F (36.9 C) (04/23 1117) Pulse Rate:  [80-100] 86 (04/23 1117) Cardiac Rhythm: Normal sinus rhythm (04/23 0700) Resp:  [12-18] 14 (04/23 1117) BP: (111-150)/(58-92) 141/74 (04/23 1117) SpO2:  [100 %] 100 % (04/23 1117)  Recent Labs  Lab 07/25/20 1117 07/25/20 1635 07/25/20 2103 07/26/20 0636 07/26/20 1207  GLUCAP 120* 89 117* 107* 118*   Recent Labs  Lab 07/23/20 1155 07/23/20 1208 07/25/20 0530 07/26/20 0351  NA 139 142 142 141  K 3.8 3.9 3.8 3.2*  CL 115* 116* 115* 117*  CO2 13*  --  18* 17*  GLUCOSE 127* 122* 135* 120*  BUN 17 18 16 12   CREATININE 1.14* 1.00 1.49* 1.17*  CALCIUM 8.6*  --  8.9 8.2*   Recent Labs  Lab 07/23/20 1155  AST 18  ALT 15  ALKPHOS 124  BILITOT 0.4  PROT 7.0  ALBUMIN 3.5   Recent Labs  Lab 07/23/20 1155 07/23/20 1208 07/25/20 0530 07/26/20 0351  WBC 8.5  --  6.0 7.3  NEUTROABS 6.1  --  4.0 4.6  HGB 10.4* 10.5* 9.9* 9.3*  HCT 33.4* 31.0* 30.4* 28.1*  MCV 85.0  --  81.3 80.1  PLT 286  --  256 256   No results for input(s): CKTOTAL, CKMB, CKMBINDEX, TROPONINI in the last 168 hours. No results for input(s): LABPROT, INR in the last 72 hours. Recent Labs    07/23/20 1444  COLORURINE AMBER*  LABSPEC 1.017  PHURINE 5.0  GLUCOSEU NEGATIVE  HGBUR NEGATIVE  BILIRUBINUR NEGATIVE  KETONESUR NEGATIVE  PROTEINUR NEGATIVE  NITRITE NEGATIVE  LEUKOCYTESUR  MODERATE*       Component Value Date/Time   CHOL 108 07/24/2020 0155   CHOL 133 04/24/2020 1501   TRIG 113 07/24/2020 0155   HDL 24 (L) 07/24/2020 0155   HDL 39 (L) 04/24/2020 1501   CHOLHDL 4.5 07/24/2020 0155   VLDL 23 07/24/2020 0155   LDLCALC 61 07/24/2020 0155   LDLCALC 74 04/24/2020 1501   Lab Results  Component Value Date   HGBA1C 7.7 (H) 07/24/2020      Component Value Date/Time   LABOPIA NONE DETECTED 07/23/2020 1444   COCAINSCRNUR NONE DETECTED 07/23/2020 1444   LABBENZ NONE DETECTED 07/23/2020 1444   AMPHETMU NONE DETECTED 07/23/2020 1444   THCU NONE DETECTED 07/23/2020 1444   LABBARB NONE DETECTED 07/23/2020 1444    No results for input(s): ETH in the last 168 hours.  I have personally reviewed the radiological images below and agree with the radiology interpretations.  MR ANGIO HEAD WO CONTRAST  Result Date: 07/24/2020 CLINICAL DATA:  Difficulty speaking EXAM: MRA HEAD WITHOUT CONTRAST TECHNIQUE: Angiographic images of the Circle of Willis were obtained using MRA technique without intravenous contrast. COMPARISON:  Brain MRI 07/23/2020 FINDINGS: POSTERIOR CIRCULATION: --Vertebral arteries: Normal --Inferior cerebellar arteries: Normal. --Basilar artery: Normal. --Superior cerebellar arteries: Normal. --Posterior cerebral arteries: Normal. ANTERIOR CIRCULATION: --Intracranial internal carotid arteries: Normal. --Anterior cerebral arteries (ACA): Normal. --  Middle cerebral arteries (MCA): There is short segment occlusion of the left MCA proximal M2 segments superior division. Moderate stenosis of the superior division of the right MCA proximal M2 segment. ANATOMIC VARIANTS: None IMPRESSION: 1. Short segment occlusion of the left MCA proximal M2 segment superior division. 2. Moderate stenosis of the superior division of the right MCA proximal M2 segment. 3. These results were called by telephone at the time of interpretation on 07/24/2020 at 1:03 am to provider [facility] , who verbally acknowledged these results. These results were communicated to Dr. Kerney Elbe at 1:03 am on 07/24/2020 by text page via the [facility] messaging system. Electronically Signed   By: Ulyses Jarred M.D.   On: 07/24/2020 01:04   MR BRAIN WO CONTRAST  Result Date: 07/23/2020 CLINICAL DATA:  Neurological deficit.  Acute stroke suspected. EXAM: MRI HEAD WITHOUT CONTRAST TECHNIQUE: Multiplanar, multiecho pulse sequences of the brain and surrounding structures were obtained without intravenous contrast. COMPARISON:  CT 03/22/2020.  MRI 03/21/2020. FINDINGS: Brain: There are several acute infarctions within the left hemisphere extending from the posterior frontal cortical and subcortical brain to the parieto-occipital cortical and subcortical brain. Findings could be watershed or embolic. Mild swelling of the regions of acute infarction. Petechial blood products in the left parieto-occipital region infarctions. Minimal petechial blood products in the largest region of posterior frontal infarction. No focal abnormality affects the brainstem or cerebellum. Right cerebral hemisphere shows a few old small vessel infarctions. Left cerebral hemisphere shows old watershed infarctions which were acute in December of 2021. No hydrocephalus or extra-axial collection. Vascular: Major vessels at the base of the brain show flow. Skull and upper cervical spine: Negative Sinuses/Orbits: Clear/normal Other: None IMPRESSION: 1. Several acute infarctions in the left hemisphere extending from the posterior frontal cortical and subcortical brain to the parieto-occipital cortical and subcortical brain. Findings could be watershed or embolic. Petechial blood products in the largest region of posterior frontal infarction and left parieto-occipital infarction. 2. 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 Electronically Signed   By: Nelson Chimes M.D.   On: 07/23/2020 17:28   VAS [place] ABI WITH/WO TBI  Result Date:  07/24/2020 LOWER EXTREMITY DOPPLER STUDY Indications: LLE "cool" - BLE warm to touch upon examination. High Risk Factors: Hypertension, hyperlipidemia, Diabetes, no history of                    smoking, prior CVA.  Comparison Study: No previous exam Performing Technologist: Hill, Jody RVT, RDMS  Examination Guidelines: A complete evaluation includes at minimum, Doppler waveform signals and systolic blood pressure reading at the level of bilateral brachial, anterior tibial, and posterior tibial arteries, when vessel segments are accessible. Bilateral testing is considered an integral part of a complete examination. Photoelectric Plethysmograph (PPG) waveforms and toe systolic pressure readings are included as required and additional duplex testing as needed. Limited examinations for reoccurring indications may be performed as noted.  ABI Findings: +---------+------------------+-----+---------+---------------+ Right    Rt Pressure (mmHg)IndexWaveform Comment         +---------+------------------+-----+---------+---------------+ Brachial 145                    triphasic                +---------+------------------+-----+---------+---------------+ PTA                             biphasic noncompressible +---------+------------------+-----+---------+---------------+ DP  232               1.60 triphasic                +---------+------------------+-----+---------+---------------+ Great Toe153               1.06 Normal                   +---------+------------------+-----+---------+---------------+ +---------+------------------+-----+---------+---------------------------------+ Left     Lt Pressure (mmHg)IndexWaveform Comment                           +---------+------------------+-----+---------+---------------------------------+ Brachial 143                    biphasic                                    +---------+------------------+-----+---------+---------------------------------+ PTA                             triphasicnoncompressible                   +---------+------------------+-----+---------+---------------------------------+ DP       226               1.56 triphasic                                  +---------+------------------+-----+---------+---------------------------------+ Great Toe101               0.70 Normal   borderline normal to mild small                                            vessel disease                    +---------+------------------+-----+---------+---------------------------------+  Summary: Right: Resting right ankle-brachial index indicates noncompressible right lower extremity arteries. The right toe-brachial index is normal. Left: Resting left ankle-brachial index indicates noncompressible left lower extremity arteries. The left toe-brachial index is normal.  *See table(s) above for measurements and observations.  Electronically signed by Harold Barban MD on 07/24/2020 at 9:28:50 PM.   Final    ECHOCARDIOGRAM COMPLETE  Result Date: 07/24/2020    ECHOCARDIOGRAM REPORT   Patient Name:   ALLAHNA HUSBAND Date of Exam: 07/24/2020 Medical Rec #:  [id number]     Height:       67.0 in Accession #:    [id number]    Weight:       250.0 lb Date of Birth:  [birth year]     BSA:          2.223 m Patient Age:    85 years      BP:           158/89 mmHg Patient Gender: F             HR:           93 bpm. Exam Location:  Inpatient Procedure: 2D Echo, Cardiac Doppler and Color Doppler Indications:    CVA  History:        Patient has prior history of Echocardiogram examinations, most  recent 03/23/2020. Stroke; Risk Factors:Hypertension and                 Diabetes.  Sonographer:    Luisa Hart RDCS Referring Phys: [id number] Courtney Paris  Sonographer Comments: Patient is morbidly obese. No cardiac surgeries noted on chart IMPRESSIONS  1. Abnormal septal  motion . Left ventricular ejection fraction, by estimation, is 55 to 60%. The left ventricle has normal function. The left ventricle has no regional wall motion abnormalities. There is mild left ventricular hypertrophy. Left ventricular diastolic parameters were normal.  2. Right ventricular systolic function is normal. The right ventricular size is normal. There is normal pulmonary artery systolic pressure.  3. The mitral valve is abnormal. No evidence of mitral valve regurgitation. No evidence of mitral stenosis.  4. The aortic valve is tricuspid. Aortic valve regurgitation is not visualized. Mild to moderate aortic valve sclerosis/calcification is present, without any evidence of aortic stenosis.  5. The inferior vena cava is normal in size with greater than 50% respiratory variability, suggesting right atrial pressure of 3 mmHg. FINDINGS  Left Ventricle: Abnormal septal motion. Left ventricular ejection fraction, by estimation, is 55 to 60%. The left ventricle has normal function. The left ventricle has no regional wall motion abnormalities. The left ventricular internal cavity size was normal in size. There is mild left ventricular hypertrophy. Left ventricular diastolic parameters were normal. Right Ventricle: The right ventricular size is normal. No increase in right ventricular wall thickness. Right ventricular systolic function is normal. There is normal pulmonary artery systolic pressure. The tricuspid regurgitant velocity is 2.48 m/s, and  with an assumed right atrial pressure of 3 mmHg, the estimated right ventricular systolic pressure is 0000000 mmHg. Left Atrium: Left atrial size was normal in size. Right Atrium: Right atrial size was normal in size. Pericardium: There is no evidence of pericardial effusion. Mitral Valve: The mitral valve is abnormal. There is moderate thickening of the mitral valve leaflet(s). There is moderate calcification of the mitral valve leaflet(s). Mild mitral annular  calcification. No evidence of mitral valve regurgitation. No evidence of mitral valve stenosis. Tricuspid Valve: The tricuspid valve is normal in structure. Tricuspid valve regurgitation is trivial. No evidence of tricuspid stenosis. Aortic Valve: The aortic valve is tricuspid. Aortic valve regurgitation is not visualized. Mild to moderate aortic valve sclerosis/calcification is present, without any evidence of aortic stenosis. Aortic valve mean gradient measures 4.0 mmHg. Aortic valve peak gradient measures 8.9 mmHg. Pulmonic Valve: The pulmonic valve was normal in structure. Pulmonic valve regurgitation is trivial. No evidence of pulmonic stenosis. Aorta: The aortic root is normal in size and structure. Venous: The inferior vena cava is normal in size with greater than 50% respiratory variability, suggesting right atrial pressure of 3 mmHg. IAS/Shunts: The interatrial septum was not well visualized.  LEFT VENTRICLE PLAX 2D LVIDd:         3.20 cm     Diastology LVIDs:         2.00 cm     LV e' medial:    5.66 cm/s LV PW:         1.40 cm     LV E/e' medial:  13.9 LV IVS:        1.25 cm     LV e' lateral:   7.72 cm/s                            LV E/e' lateral: 10.2  LV  Volumes (MOD) LV vol d, MOD A2C: 51.1 ml LV vol d, MOD A4C: 33.4 ml LV vol s, MOD A2C: 27.4 ml LV vol s, MOD A4C: 15.2 ml LV SV MOD A2C:     23.7 ml LV SV MOD A4C:     33.4 ml LV SV MOD BP:      22.0 ml RIGHT VENTRICLE RV S prime:     11.10 cm/s TAPSE (M-mode): 1.6 cm LEFT ATRIUM             Index       RIGHT ATRIUM          Index LA diam:        2.90 cm 1.30 cm/m  RA Area:     7.14 cm LA Vol (A2C):   25.9 ml 11.65 ml/m RA Volume:   10.20 ml 4.59 ml/m LA Vol (A4C):   32.8 ml 14.75 ml/m LA Biplane Vol: 29.5 ml 13.27 ml/m  AORTIC VALVE                   PULMONIC VALVE AV Vmax:           149.00 cm/s PV Vmax:       0.87 m/s AV Vmean:          95.800 cm/s PV Vmean:      71.200 cm/s AV VTI:            0.238 m     PV VTI:        0.199 m AV Peak Grad:       8.9 mmHg    PV Peak grad:  3.0 mmHg AV Mean Grad:      4.0 mmHg    PV Mean grad:  2.0 mmHg LVOT Vmax:         93.30 cm/s LVOT Vmean:        55.700 cm/s LVOT VTI:          0.147 m LVOT/AV VTI ratio: 0.62  AORTA Ao Root diam: 3.40 cm Ao Asc diam:  3.20 cm MITRAL VALVE                TRICUSPID VALVE MV Area (PHT): 3.83 cm     TR Peak grad:   24.6 mmHg MV Decel Time: 198 msec     TR Vmax:        248.00 cm/s MV E velocity: 78.80 cm/s MV A velocity: 108.00 cm/s  SHUNTS MV E/A ratio:  0.73         Systemic VTI: 0.15 m Jenkins Rouge MD Electronically signed by Jenkins Rouge MD Signature Date/Time: 07/24/2020/11:57:51 AM    Final    VAS US CAROTID (at [facility] and [facility] only)  Result Date: 07/24/2020 Carotid Arterial Duplex Study Indications:       CVA and Speech disturbance. Risk Factors:      Hypertension, hyperlipidemia, Diabetes, no history of                    smoking, prior CVA. Limitations        Today's exam was limited due to the body habitus of the                    patient. Comparison Study:  No previous exams. CTA performed 03/22/20. Performing Technologist: Rogelia Rohrer  Examination Guidelines: A complete evaluation includes B-mode imaging, spectral Doppler, color Doppler, and power Doppler as needed of all accessible portions of each vessel. Bilateral testing  is considered an integral part of a complete examination. Limited examinations for reoccurring indications may be performed as noted.  Right Carotid Findings: +----------+--------+--------+--------+------------------+------------------+           PSV cm/sEDV cm/sStenosisPlaque DescriptionComments           +----------+--------+--------+--------+------------------+------------------+ CCA Prox  91      18                                                   +----------+--------+--------+--------+------------------+------------------+ CCA Distal62      17                                intimal thickening  +----------+--------+--------+--------+------------------+------------------+ ICA Prox  80      30                                                   +----------+--------+--------+--------+------------------+------------------+ ICA Distal57      22                                                   +----------+--------+--------+--------+------------------+------------------+ ECA       92      14                                                   +----------+--------+--------+--------+------------------+------------------+ +----------+--------+-------+----------------+-------------------+           PSV cm/sEDV cmsDescribe        Arm Pressure (mmHG) +----------+--------+-------+----------------+-------------------+ [id number]             Multiphasic, WNL                    +----------+--------+-------+----------------+-------------------+ +---------+--------+--+--------+--+---------+ VertebralPSV cm/s41EDV cm/s12Antegrade +---------+--------+--+--------+--+---------+  Left Carotid Findings: +----------+--------+--------+--------+------------------+------------------+           PSV cm/sEDV cm/sStenosisPlaque DescriptionComments           +----------+--------+--------+--------+------------------+------------------+ CCA Prox  77      14                                intimal thickening +----------+--------+--------+--------+------------------+------------------+ CCA Distal68      14                                intimal thickening +----------+--------+--------+--------+------------------+------------------+ ICA Prox  70      27                                                   +----------+--------+--------+--------+------------------+------------------+ ICA Distal81      33                                                   +----------+--------+--------+--------+------------------+------------------+  ECA       76      11                                                    +----------+--------+--------+--------+------------------+------------------+ +----------+--------+--------+----------------+-------------------+           PSV cm/sEDV cm/sDescribe        Arm Pressure (mmHG) +----------+--------+--------+----------------+-------------------+ [id number]              Multiphasic, WNL                    +----------+--------+--------+----------------+-------------------+ +---------+--------+--+--------+--+---------+ VertebralPSV cm/s58EDV cm/s14Antegrade +---------+--------+--+--------+--+---------+   Summary: Right Carotid: The extracranial vessels were near-normal with only minimal wall                thickening or plaque. Left Carotid: The extracranial vessels were near-normal with only minimal wall               thickening or plaque. Vertebrals:  Bilateral vertebral arteries demonstrate antegrade flow. Subclavians: Normal flow hemodynamics were seen in bilateral subclavian              arteries. *See table(s) above for measurements and observations.  Electronically signed by Harold Barban MD on 07/24/2020 at 9:28:20 PM.    Final     PHYSICAL EXAM  Temp:  [98.3 F (36.8 C)-98.6 F (37 C)] 98.5 F (36.9 C) (04/23 1117) Pulse Rate:  [80-100] 86 (04/23 1117) Resp:  [12-18] 14 (04/23 1117) BP: (111-150)/(58-92) 141/74 (04/23 1117) SpO2:  [100 %] 100 % (04/23 1117)  General - morbid obesity, well developed, in no apparent distress.  Ophthalmologic - fundi not visualized due to noncooperation.  Cardiovascular - Regular rhythm and rate.  Mental Status -  Level of arousal and orientation to time, place, and person were intact. Language including expression, naming, repetition, comprehension was assessed and found intact.  Cranial Nerves II - XII - II - Visual field intact OU. III, IV, VI - Extraocular movements intact. V - Facial sensation intact bilaterally. VII - Facial movement intact bilaterally. VIII - Hearing  & vestibular intact bilaterally. X - Palate elevates symmetrically. XI - Chin turning & shoulder shrug intact bilaterally. XII - Tongue protrusion intact.  Motor Strength - The patient's strength was normal in all extremities and pronator drift was absent.  Bulk was normal and fasciculations were absent.   Motor Tone - Muscle tone was assessed at the neck and appendages and was normal.  Reflexes - The patient's reflexes were symmetrical in all extremities and she had no pathological reflexes  Sensory - Light touch, temperature/pinprick were assessed and were symmetrical.    Coordination - The patient had normal movements in the hands with no ataxia or dysmetria.  Tremor was absent.  Gait and Station - deferred.   ASSESSMENT/PLAN Ms. Sharon Blankenship is a 55 y.o. 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 No tPA given due to outside window.    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  LDL 61  HgbA1c 7.7  P2Y12 25  Lovenox for VTE prophylaxis  aspirin 81 mg daily prior to admission, now on aspirin 81 mg daily and Brilinta (ticagrelor) 90 mg bid.  Patient counseled to be compliant with her antithrombotic medications  Ongoing aggressive stroke risk factor management  Therapy recommendations: OT: no followup needs; Home health PT  Disposition: Pending  History of stroke  03/2020 admitted for right upper extremity numbness weakness, slurred speech and difficulty walking.  CT left frontal infarct.  MRI showed left MCA territory, CR, MCA/ACA and MCA/PCA watershed area infarcts.  MRI showed severe left M2  stenosis.  CTA head and neck left ICA atherosclerosis with plaque, left M2 severe stenosis.  LDL 159, A1c 6.6.  EF 55 to 60%.  Discharged on DAPT and Lipitor.  Open angle glaucoma  Was on Diamox 250 TID  plan for eye surgery 08/12/2020  Diamox (although IV more than po) can cause " reverse Robinhood phenomena" that can worsen left MCA perfusion, current off meds, recommend to hold if OK with ophthalmology until MCA stenting done  Diamox was currently on hold, okay to continue after MCA stenting  Diabetes  HgbA1c 7.7 goal < 7.0  Uncontrolled  Currently on Lantus  CBG monitoring  SSI  DM education and close PCP follow up  Hypertension . Stable . On Cozaar 100mg  daily . Avoid low BP  Long term BP goal 130-150 given left M2 high-grade stenosis.  Long-term BP goal normotensive after MCA stenting.  Hyperlipidemia  Home meds: Lipitor 20  LDL 61, goal < 70  Now on Lipitor 40  Continue statin at discharge  Other Stroke Risk Factors  Obesity, Body mass index is 39.16 kg/m.   Other Active Problems  Hypokalemia: level 3.2, replete  Hospital day # 3  Sharon Blankenship, ACNP-BC Stroke NP    To contact Stroke Continuity provider, please refer to [email or web address] After hours, contact General Neurology

## 2020-07-27 LAB — CBC WITH DIFFERENTIAL/PLATELET
Abs Immature Granulocytes: 0.03 10*3/uL (ref 0.00–0.07)
Basophils Absolute: 0 10*3/uL (ref 0.0–0.1)
Basophils Relative: 1 %
Eosinophils Absolute: 0.1 10*3/uL (ref 0.0–0.5)
Eosinophils Relative: 2 %
HCT: 29.2 % — ABNORMAL LOW (ref 36.0–46.0)
Hemoglobin: 9.4 g/dL — ABNORMAL LOW (ref 12.0–15.0)
Immature Granulocytes: 0 %
Lymphocytes Relative: 34 %
Lymphs Abs: 2.4 10*3/uL (ref 0.7–4.0)
MCH: 26.3 pg (ref 26.0–34.0)
MCHC: 32.2 g/dL (ref 30.0–36.0)
MCV: 81.6 fL (ref 80.0–100.0)
Monocytes Absolute: 0.5 10*3/uL (ref 0.1–1.0)
Monocytes Relative: 7 %
Neutro Abs: 4.1 10*3/uL (ref 1.7–7.7)
Neutrophils Relative %: 56 %
Platelets: 258 10*3/uL (ref 150–400)
RBC: 3.58 MIL/uL — ABNORMAL LOW (ref 3.87–5.11)
RDW: 14.8 % (ref 11.5–15.5)
WBC: 7.2 10*3/uL (ref 4.0–10.5)
nRBC: 0 % (ref 0.0–0.2)

## 2020-07-27 LAB — BASIC METABOLIC PANEL
Anion gap: 7 (ref 5–15)
BUN: 11 mg/dL (ref 6–20)
CO2: 17 mmol/L — ABNORMAL LOW (ref 22–32)
Calcium: 8.5 mg/dL — ABNORMAL LOW (ref 8.9–10.3)
Chloride: 116 mmol/L — ABNORMAL HIGH (ref 98–111)
Creatinine, Ser: 1.13 mg/dL — ABNORMAL HIGH (ref 0.44–1.00)
GFR, Estimated: 59 mL/min — ABNORMAL LOW (ref >60)
Glucose, Bld: 168 mg/dL — ABNORMAL HIGH (ref 70–99)
Potassium: 3.7 mmol/L (ref 3.5–5.1)
Sodium: 140 mmol/L (ref 135–145)

## 2020-07-27 LAB — GLUCOSE, CAPILLARY
Glucose-Capillary: 232 mg/dL — ABNORMAL HIGH (ref 70–99)
Glucose-Capillary: 238 mg/dL — ABNORMAL HIGH (ref 70–99)
Glucose-Capillary: 178 mg/dL — ABNORMAL HIGH (ref 70–99)
Glucose-Capillary: 177 mg/dL — ABNORMAL HIGH (ref 70–99)

## 2020-07-27 NOTE — Progress Notes (Signed)
HD#4 Subjective:   No acute events overnight.  During evaluation this morning, patient states she is feeling well. She denies new speech difficulty, weakness, numbness, vision changes. Her sister is at bedside and has questions regarding tomorrow's planned stent placement. Questions and concerns addressed at bedside.  Objective:   Vital signs in last 24 hours: Vitals:   07/26/20 1117 07/26/20 1550 07/26/20 2055 07/27/20 0458  BP: (!) 141/74 (!) 146/84 (!) 143/77 133/66  Pulse: 86 87 85 98  Resp: 14 20 20 18   Temp: 98.5 F (36.9 C) 98 F (36.7 C) 97.7 F (36.5 C) 97.9 F (36.6 C)  TempSrc: Oral Oral Oral Oral  SpO2: 100% 100% 100% 100%  Weight:      Height:       Physical Exam Constitutional: well-appearing woman sitting up in bed, awake and alert, in no acute distress HENT: normocephalic atraumatic, mucous membranes moist Cardiovascular: regular rate and rhythm, no m/r/g Pulmonary/Chest: normal work of breathing on room air Neurological: alert & oriented to person, place, time, and situation. 5/5 strength in bilateral upper extremities. Follows and participates in conversation appropriately.  Pertinent Labs: CBC Latest Ref Rng & Units 07/27/2020 07/26/2020 07/25/2020  WBC 4.0 - 10.5 K/uL 7.2 7.3 6.0  Hemoglobin 12.0 - 15.0 g/dL 9.4(L) 9.3(L) 9.9(L)  Hematocrit 36.0 - 46.0 % 29.2(L) 28.1(L) 30.4(L)  Platelets 150 - 400 K/uL 258 256 256    CMP Latest Ref Rng & Units 07/27/2020 07/26/2020 07/25/2020  Glucose 70 - 99 mg/dL 168(H) 120(H) 135(H)  BUN 6 - 20 mg/dL 11 12 16   Creatinine 0.44 - 1.00 mg/dL 1.13(H) 1.17(H) 1.49(H)  Sodium 135 - 145 mmol/L 140 141 142  Potassium 3.5 - 5.1 mmol/L 3.7 3.2(L) 3.8  Chloride 98 - 111 mmol/L 116(H) 117(H) 115(H)  CO2 22 - 32 mmol/L 17(L) 17(L) 18(L)  Calcium 8.9 - 10.3 mg/dL 8.5(L) 8.2(L) 8.9  Total Protein 6.5 - 8.1 g/dL - - -  Total Bilirubin 0.3 - 1.2 mg/dL - - -  Alkaline Phos 38 - 126 U/L - - -  AST 15 - 41 U/L - - -  ALT 0 -  44 U/L - - -    Imaging: no new imaging  Assessment/Plan:   Active Problems:   Secondary open-angle glaucoma of both eyes, severe stage   Hypertension   DM (diabetes mellitus), type 2, uncontrolled with complications (HCC)   Hyperlipidemia   Stroke ([place])   Mild concentric left ventricular hypertrophy (LVH)   Patient Summary:  Sharon Blankenship is a 55 y.o. 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  Acute CVA, multiple left MCA territory subacute ischemic infarctions 2/2 MCA territory stenosis Hx multiple left MCA territory embolic stroke (58/5277) 2/2 symptomatic MCA territory stenosis and hypertension Patient remains at her neurologic baseline. Plan for left MCA stenting tomorrow (4/25).  - Neurology following, appreciate their expertise - Dr. Estanislado Pandy to perform MCA stenting tomorrow (4/25)  - BP goal is systolic 824-235 given left M2 high-grade stenosis. Long-term BP goal normotensive after MCA stenting. - No OT follow-up, PT recommend HHPT - Continue DAPT (aspirin 81 mg daily and Brilinta 90 mg BID) - Will confirm with patient's glaucoma specialist that glaucoma surgery can proceed as scheduled on 08/13/20 - Telemetry monitoring - Frequent neurochecks  Essential hypertension Patient's BP have been mostly 130s-150s/60s-90s on  home losartan 100 mg daily only.  - BP goal is systolic 161-096 given left M2 high-grade stenosis. Long-term BP goal normotensive after MCA stenting. - Continue home losartan 100 mg daily - Holding home Diamox 250 mg 3 times daily as above, okay to continue after MCA stenting  AKI, resolved Serum creatinine 1.49>>1.17>1.13 this admission. Baseline appears to be 1-1.2. Suspect pre-renal in setting of mild dehydration. Will continue to monitor especially  given upcoming procedure. - encourage PO hydration - AM BMP  Type 2 diabetes A1c 7.7.  Current regimen of Tresiba 76 units daily, Ozempic 1 mg daily, metformin 1000 mg twice daily.  Fiasp 18 units daily as needed.  Continue gabapentin 100 mg 3 times daily for nerve pain. -SSI, Lantus 50 units daily during admission  Hyperlipidemia continue home atorvastatin 20 mg daily.  Lipid panel total cholesterol 108 LDL of 61.  Glaucoma History of glaucoma, on acetazolamide 250 mg 3 times daily. Holding as above. - Continue Xalatan solution both eyes daily, Cosopt 1 drop right eye twice daily, Alphagan 1 drop both eyes 3 times daily - Resume Diamox after MCA stenting   Diet: Heart Healthy IVF: none VTE: Enoxaparin Code: Full PT/OT recs: HHPT (would have to be [facility] HHPT per TOC), no OT follow-up needed    Please contact the on call pager after 5 pm and on weekends at [phone].  Alexandria Lodge, MD PGY-1 Internal Medicine Teaching Service Pager: [phone] 07/27/2020

## 2020-07-27 NOTE — Progress Notes (Signed)
STROKE TEAM PROGRESS NOTE   SUBJECTIVE (INTERVAL HISTORY)  Family member at bedside, visiting. No acute events overnight. Patient is alert and oriented.  All questions answered  Plan for left MCA stenting Monday so NPO after midnight.    OBJECTIVE Temp:  [97.7 F (36.5 C)-98.1 F (36.7 C)] 98.1 F (36.7 C) (04/24 0736) Pulse Rate:  [85-98] 98 (04/24 0736) Cardiac Rhythm: Normal sinus rhythm (04/24 0700) Resp:  [16-20] 16 (04/24 0736) BP: (133-148)/(66-84) 148/74 (04/24 0736) SpO2:  [100 %] 100 % (04/24 0736)  Recent Labs  Lab 07/26/20 0636 07/26/20 1207 07/26/20 1628 07/26/20 2127 07/27/20 0801  GLUCAP 107* 118* 129* 180* 232*   Recent Labs  Lab 07/23/20 1155 07/23/20 1208 07/25/20 0530 07/26/20 0351 07/27/20 0211  NA 139 142 142 141 140  K 3.8 3.9 3.8 3.2* 3.7  CL 115* 116* 115* 117* 116*  CO2 13*  --  18* 17* 17*  GLUCOSE 127* 122* 135* 120* 168*  BUN 17 18 16 12 11   CREATININE 1.14* 1.00 1.49* 1.17* 1.13*  CALCIUM 8.6*  --  8.9 8.2* 8.5*   Recent Labs  Lab 07/23/20 1155  AST 18  ALT 15  ALKPHOS 124  BILITOT 0.4  PROT 7.0  ALBUMIN 3.5   Recent Labs  Lab 07/23/20 1155 07/23/20 1208 07/25/20 0530 07/26/20 0351 07/27/20 0211  WBC 8.5  --  6.0 7.3 7.2  NEUTROABS 6.1  --  4.0 4.6 4.1  HGB 10.4* 10.5* 9.9* 9.3* 9.4*  HCT 33.4* 31.0* 30.4* 28.1* 29.2*  MCV 85.0  --  81.3 80.1 81.6  PLT 286  --  256 256 258   No results for input(s): CKTOTAL, CKMB, CKMBINDEX, TROPONINI in the last 168 hours. No results for input(s): LABPROT, INR in the last 72 hours. No results for input(s): COLORURINE, LABSPEC, [place], GLUCOSEU, HGBUR, BILIRUBINUR, KETONESUR, PROTEINUR, UROBILINOGEN, NITRITE, LEUKOCYTESUR in the last 72 hours.  Invalid input(s): APPERANCEUR     Component Value Date/Time   CHOL 108 07/24/2020 0155   CHOL 133 04/24/2020 1501   TRIG 113 07/24/2020 0155   HDL 24 (L) 07/24/2020 0155   HDL 39 (L) 04/24/2020 1501   CHOLHDL 4.5 07/24/2020 0155    VLDL 23 07/24/2020 0155   LDLCALC 61 07/24/2020 0155   LDLCALC 74 04/24/2020 1501   Lab Results  Component Value Date   HGBA1C 7.7 (H) 07/24/2020      Component Value Date/Time   LABOPIA NONE DETECTED 07/23/2020 1444   COCAINSCRNUR NONE DETECTED 07/23/2020 1444   LABBENZ NONE DETECTED 07/23/2020 1444   AMPHETMU NONE DETECTED 07/23/2020 1444   THCU NONE DETECTED 07/23/2020 1444   LABBARB NONE DETECTED 07/23/2020 1444    No results for input(s): ETH in the last 168 hours.   MR ANGIO HEAD WO CONTRAST  Result Date: 07/24/2020 CLINICAL DATA:  Difficulty speaking EXAM: MRA HEAD WITHOUT CONTRAST TECHNIQUE: Angiographic images of the Circle of Willis were obtained using MRA technique without intravenous contrast. COMPARISON:  Brain MRI 07/23/2020 FINDINGS: POSTERIOR CIRCULATION: --Vertebral arteries: Normal --Inferior cerebellar arteries: Normal. --Basilar artery: Normal. --Superior cerebellar arteries: Normal. --Posterior cerebral arteries: Normal. ANTERIOR CIRCULATION: --Intracranial internal carotid arteries: Normal. --Anterior cerebral arteries (ACA): Normal. --Middle cerebral arteries (MCA): There is short segment occlusion of the left MCA proximal M2 segments superior division. Moderate stenosis of the superior division of the right MCA proximal M2 segment. ANATOMIC VARIANTS: None IMPRESSION: 1. Short segment occlusion of the left MCA proximal M2 segment superior division. 2. Moderate stenosis of the  superior division of the right MCA proximal M2 segment. 3. These results were called by telephone at the time of interpretation on 07/24/2020 at 1:03 am to provider [facility] , who verbally acknowledged these results. These results were communicated to Dr. Kerney Elbe at 1:03 am on 07/24/2020 by text page via the [facility] messaging system. Electronically Signed   By: Ulyses Jarred M.D.   On: 07/24/2020 01:04   MR BRAIN WO CONTRAST  Result Date: 07/23/2020 CLINICAL DATA:  Neurological  deficit.  Acute stroke suspected. EXAM: MRI HEAD WITHOUT CONTRAST TECHNIQUE: Multiplanar, multiecho pulse sequences of the brain and surrounding structures were obtained without intravenous contrast. COMPARISON:  CT 03/22/2020.  MRI 03/21/2020. FINDINGS: Brain: There are several acute infarctions within the left hemisphere extending from the posterior frontal cortical and subcortical brain to the parieto-occipital cortical and subcortical brain. Findings could be watershed or embolic. Mild swelling of the regions of acute infarction. Petechial blood products in the left parieto-occipital region infarctions. Minimal petechial blood products in the largest region of posterior frontal infarction. No focal abnormality affects the brainstem or cerebellum. Right cerebral hemisphere shows a few old small vessel infarctions. Left cerebral hemisphere shows old watershed infarctions which were acute in December of 2021. No hydrocephalus or extra-axial collection. Vascular: Major vessels at the base of the brain show flow. Skull and upper cervical spine: Negative Sinuses/Orbits: Clear/normal Other: None IMPRESSION: 1. Several acute infarctions in the left hemisphere extending from the posterior frontal cortical and subcortical brain to the parieto-occipital cortical and subcortical brain. Findings could be watershed or embolic. Petechial blood products in the largest region of posterior frontal infarction and left parieto-occipital infarction. 2. 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 Electronically Signed   By: Nelson Chimes M.D.   On: 07/23/2020 17:28   VAS [place] ABI WITH/WO TBI  Result Date: 07/24/2020 LOWER EXTREMITY DOPPLER STUDY Indications: LLE "cool" - BLE warm to touch upon examination. High Risk Factors: Hypertension, hyperlipidemia, Diabetes, no history of                    smoking, prior CVA.  Comparison Study: No previous exam Performing Technologist: Hill, Jody RVT, RDMS  Examination Guidelines: A complete  evaluation includes at minimum, Doppler waveform signals and systolic blood pressure reading at the level of bilateral brachial, anterior tibial, and posterior tibial arteries, when vessel segments are accessible. Bilateral testing is considered an integral part of a complete examination. Photoelectric Plethysmograph (PPG) waveforms and toe systolic pressure readings are included as required and additional duplex testing as needed. Limited examinations for reoccurring indications may be performed as noted.  ABI Findings: +---------+------------------+-----+---------+---------------+ Right    Rt Pressure (mmHg)IndexWaveform Comment         +---------+------------------+-----+---------+---------------+ Brachial 145                    triphasic                +---------+------------------+-----+---------+---------------+ PTA                             biphasic noncompressible +---------+------------------+-----+---------+---------------+ DP       232               1.60 triphasic                +---------+------------------+-----+---------+---------------+ Great Toe153               1.06 Normal                   +---------+------------------+-----+---------+---------------+ +---------+------------------+-----+---------+---------------------------------+  Left     Lt Pressure (mmHg)IndexWaveform Comment                           +---------+------------------+-----+---------+---------------------------------+ Brachial 143                    biphasic                                   +---------+------------------+-----+---------+---------------------------------+ PTA                             triphasicnoncompressible                   +---------+------------------+-----+---------+---------------------------------+ DP       226               1.56 triphasic                                  +---------+------------------+-----+---------+---------------------------------+  Great Toe101               0.70 Normal   borderline normal to mild small                                            vessel disease                    +---------+------------------+-----+---------+---------------------------------+  Summary: Right: Resting right ankle-brachial index indicates noncompressible right lower extremity arteries. The right toe-brachial index is normal. Left: Resting left ankle-brachial index indicates noncompressible left lower extremity arteries. The left toe-brachial index is normal.  *See table(s) above for measurements and observations.  Electronically signed by Harold Barban MD on 07/24/2020 at 9:28:50 PM.   Final    ECHOCARDIOGRAM COMPLETE  Result Date: 07/24/2020    ECHOCARDIOGRAM REPORT   Patient Name:   KALEB LINQUIST Date of Exam: 07/24/2020 Medical Rec #:  [id number]     Height:       67.0 in Accession #:    [id number]    Weight:       250.0 lb Date of Birth:  07-26-1965     BSA:          2.223 m Patient Age:    43 years      BP:           158/89 mmHg Patient Gender: F             HR:           93 bpm. Exam Location:  Inpatient Procedure: 2D Echo, Cardiac Doppler and Color Doppler Indications:    CVA  History:        Patient has prior history of Echocardiogram examinations, most                 recent 03/23/2020. Stroke; Risk Factors:Hypertension and                 Diabetes.  Sonographer:    Luisa Hart RDCS Referring Phys: [id number] Courtney Paris  Sonographer Comments: Patient is morbidly obese. No cardiac surgeries noted on chart IMPRESSIONS  1. Abnormal septal motion . Left ventricular ejection fraction, by estimation, is  55 to 60%. The left ventricle has normal function. The left ventricle has no regional wall motion abnormalities. There is mild left ventricular hypertrophy. Left ventricular diastolic parameters were normal.  2. Right ventricular systolic function is normal. The right ventricular size is normal. There is normal pulmonary artery systolic  pressure.  3. The mitral valve is abnormal. No evidence of mitral valve regurgitation. No evidence of mitral stenosis.  4. The aortic valve is tricuspid. Aortic valve regurgitation is not visualized. Mild to moderate aortic valve sclerosis/calcification is present, without any evidence of aortic stenosis.  5. The inferior vena cava is normal in size with greater than 50% respiratory variability, suggesting right atrial pressure of 3 mmHg. FINDINGS  Left Ventricle: Abnormal septal motion. Left ventricular ejection fraction, by estimation, is 55 to 60%. The left ventricle has normal function. The left ventricle has no regional wall motion abnormalities. The left ventricular internal cavity size was normal in size. There is mild left ventricular hypertrophy. Left ventricular diastolic parameters were normal. Right Ventricle: The right ventricular size is normal. No increase in right ventricular wall thickness. Right ventricular systolic function is normal. There is normal pulmonary artery systolic pressure. The tricuspid regurgitant velocity is 2.48 m/s, and  with an assumed right atrial pressure of 3 mmHg, the estimated right ventricular systolic pressure is 0000000 mmHg. Left Atrium: Left atrial size was normal in size. Right Atrium: Right atrial size was normal in size. Pericardium: There is no evidence of pericardial effusion. Mitral Valve: The mitral valve is abnormal. There is moderate thickening of the mitral valve leaflet(s). There is moderate calcification of the mitral valve leaflet(s). Mild mitral annular calcification. No evidence of mitral valve regurgitation. No evidence of mitral valve stenosis. Tricuspid Valve: The tricuspid valve is normal in structure. Tricuspid valve regurgitation is trivial. No evidence of tricuspid stenosis. Aortic Valve: The aortic valve is tricuspid. Aortic valve regurgitation is not visualized. Mild to moderate aortic valve sclerosis/calcification is present, without any evidence  of aortic stenosis. Aortic valve mean gradient measures 4.0 mmHg. Aortic valve peak gradient measures 8.9 mmHg. Pulmonic Valve: The pulmonic valve was normal in structure. Pulmonic valve regurgitation is trivial. No evidence of pulmonic stenosis. Aorta: The aortic root is normal in size and structure. Venous: The inferior vena cava is normal in size with greater than 50% respiratory variability, suggesting right atrial pressure of 3 mmHg. IAS/Shunts: The interatrial septum was not well visualized.  LEFT VENTRICLE PLAX 2D LVIDd:         3.20 cm     Diastology LVIDs:         2.00 cm     LV e' medial:    5.66 cm/s LV PW:         1.40 cm     LV E/e' medial:  13.9 LV IVS:        1.25 cm     LV e' lateral:   7.72 cm/s                            LV E/e' lateral: 10.2  LV Volumes (MOD) LV vol d, MOD A2C: 51.1 ml LV vol d, MOD A4C: 33.4 ml LV vol s, MOD A2C: 27.4 ml LV vol s, MOD A4C: 15.2 ml LV SV MOD A2C:     23.7 ml LV SV MOD A4C:     33.4 ml LV SV MOD BP:      22.0 ml RIGHT VENTRICLE RV  S prime:     11.10 cm/s TAPSE (M-mode): 1.6 cm LEFT ATRIUM             Index       RIGHT ATRIUM          Index LA diam:        2.90 cm 1.30 cm/m  RA Area:     7.14 cm LA Vol (A2C):   25.9 ml 11.65 ml/m RA Volume:   10.20 ml 4.59 ml/m LA Vol (A4C):   32.8 ml 14.75 ml/m LA Biplane Vol: 29.5 ml 13.27 ml/m  AORTIC VALVE                   PULMONIC VALVE AV Vmax:           149.00 cm/s PV Vmax:       0.87 m/s AV Vmean:          95.800 cm/s PV Vmean:      71.200 cm/s AV VTI:            0.238 m     PV VTI:        0.199 m AV Peak Grad:      8.9 mmHg    PV Peak grad:  3.0 mmHg AV Mean Grad:      4.0 mmHg    PV Mean grad:  2.0 mmHg LVOT Vmax:         93.30 cm/s LVOT Vmean:        55.700 cm/s LVOT VTI:          0.147 m LVOT/AV VTI ratio: 0.62  AORTA Ao Root diam: 3.40 cm Ao Asc diam:  3.20 cm MITRAL VALVE                TRICUSPID VALVE MV Area (PHT): 3.83 cm     TR Peak grad:   24.6 mmHg MV Decel Time: 198 msec     TR Vmax:        248.00 cm/s  MV E velocity: 78.80 cm/s MV A velocity: 108.00 cm/s  SHUNTS MV E/A ratio:  0.73         Systemic VTI: 0.15 m Jenkins Rouge MD Electronically signed by Jenkins Rouge MD Signature Date/Time: 07/24/2020/11:57:51 AM    Final    VAS US CAROTID (at [facility] and [facility] only)  Result Date: 07/24/2020 Carotid Arterial Duplex Study Indications:       CVA and Speech disturbance. Risk Factors:      Hypertension, hyperlipidemia, Diabetes, no history of                    smoking, prior CVA. Limitations        Today's exam was limited due to the body habitus of the                    patient. Comparison Study:  No previous exams. CTA performed 03/22/20. Performing Technologist: Rogelia Rohrer  Examination Guidelines: A complete evaluation includes B-mode imaging, spectral Doppler, color Doppler, and power Doppler as needed of all accessible portions of each vessel. Bilateral testing is considered an integral part of a complete examination. Limited examinations for reoccurring indications may be performed as noted.  Right Carotid Findings: +----------+--------+--------+--------+------------------+------------------+           PSV cm/sEDV cm/sStenosisPlaque DescriptionComments           +----------+--------+--------+--------+------------------+------------------+ CCA Prox  91      18                                                   +----------+--------+--------+--------+------------------+------------------+  CCA Distal62      17                                intimal thickening +----------+--------+--------+--------+------------------+------------------+ ICA Prox  80      30                                                   +----------+--------+--------+--------+------------------+------------------+ ICA Distal57      22                                                   +----------+--------+--------+--------+------------------+------------------+ ECA       92      14                                                    +----------+--------+--------+--------+------------------+------------------+ +----------+--------+-------+----------------+-------------------+           PSV cm/sEDV cmsDescribe        Arm Pressure (mmHG) +----------+--------+-------+----------------+-------------------+ [id number]             Multiphasic, WNL                    +----------+--------+-------+----------------+-------------------+ +---------+--------+--+--------+--+---------+ VertebralPSV cm/s41EDV cm/s12Antegrade +---------+--------+--+--------+--+---------+  Left Carotid Findings: +----------+--------+--------+--------+------------------+------------------+           PSV cm/sEDV cm/sStenosisPlaque DescriptionComments           +----------+--------+--------+--------+------------------+------------------+ CCA Prox  77      14                                intimal thickening +----------+--------+--------+--------+------------------+------------------+ CCA Distal68      14                                intimal thickening +----------+--------+--------+--------+------------------+------------------+ ICA Prox  70      27                                                   +----------+--------+--------+--------+------------------+------------------+ ICA Distal81      33                                                   +----------+--------+--------+--------+------------------+------------------+ ECA       76      11                                                   +----------+--------+--------+--------+------------------+------------------+ +----------+--------+--------+----------------+-------------------+  PSV cm/sEDV cm/sDescribe        Arm Pressure (mmHG) +----------+--------+--------+----------------+-------------------+ [id number]              Multiphasic, WNL                    +----------+--------+--------+----------------+-------------------+  +---------+--------+--+--------+--+---------+ VertebralPSV cm/s58EDV cm/s14Antegrade +---------+--------+--+--------+--+---------+   Summary: Right Carotid: The extracranial vessels were near-normal with only minimal wall                thickening or plaque. Left Carotid: The extracranial vessels were near-normal with only minimal wall               thickening or plaque. Vertebrals:  Bilateral vertebral arteries demonstrate antegrade flow. Subclavians: Normal flow hemodynamics were seen in bilateral subclavian              arteries. *See table(s) above for measurements and observations.  Electronically signed by Harold Barban MD on 07/24/2020 at 9:28:20 PM.    Final     PHYSICAL EXAM  Temp:  [97.7 F (36.5 C)-98.1 F (36.7 C)] 98.1 F (36.7 C) (04/24 0736) Pulse Rate:  [85-98] 98 (04/24 0736) Resp:  [16-20] 16 (04/24 0736) BP: (133-148)/(66-84) 148/74 (04/24 0736) SpO2:  [100 %] 100 % (04/24 0736)  General - morbid obesity, well developed, in no apparent distress. Cardiovascular - Regular rhythm and rate.  Mental Status -  Level of arousal and orientation to time, place, and person were intact. Language including expression, naming, repetition, comprehension was assessed and found intact.  Cranial Nerves II - XII - II - Visual field intact OU. III, IV, VI - Extraocular movements intact. V - Facial sensation intact bilaterally. VII - Facial movement intact bilaterally. VIII - Hearing & vestibular intact bilaterally. X - Palate elevates symmetrically. XI - Chin turning & shoulder shrug intact bilaterally. XII - Tongue protrusion intact.  Motor Strength - The patient's strength was normal in all extremities and pronator drift was absent.  Bulk was normal and fasciculations were absent.   Motor Tone - normal   Sensory - Light touch, temperature/pinprick were assessed and were symmetrical.    Coordination - The patient had normal movements in the hands with no ataxia or  dysmetria.  Tremor was absent.  Gait and Station - deferred.   ASSESSMENT/PLAN Ms. Corbyn Steedman is a 55 y.o. 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 No tPA given due to outside window.    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  LDL 61  HgbA1c 7.7  P2Y12 25  Lovenox for VTE prophylaxis  aspirin 81 mg daily prior to admission, now on aspirin 81 mg daily and Brilinta (ticagrelor) 90 mg bid.  Patient counseled to be compliant with her antithrombotic medications  Ongoing aggressive stroke risk factor management  Therapy recommendations: OT: no followup needs; Home health PT  Disposition: Pending  History of stroke  03/2020 admitted for right upper extremity numbness weakness, slurred speech and difficulty walking.  CT left frontal infarct.  MRI showed left MCA territory, CR, MCA/ACA and MCA/PCA watershed area infarcts.  MRI showed severe left M2 stenosis.  CTA head and neck left  ICA atherosclerosis with plaque, left M2 severe stenosis.  LDL 159, A1c 6.6.  EF 55 to 60%.  Discharged on DAPT and Lipitor.  Open angle glaucoma  Was on Diamox 250 TID  plan for eye surgery 08/12/2020  Diamox (although IV more than po) can cause " reverse Robinhood phenomena" that can worsen left MCA perfusion, current off meds, recommend to hold if OK with ophthalmology until MCA stenting done  Diamox was currently on hold, okay to continue after MCA stenting  Diabetes  HgbA1c 7.7 goal < 7.0  Uncontrolled  Currently on Lantus  CBG monitoring  SSI  DM education and close PCP follow  up  Hypertension . Stable . On Cozaar 100mg  daily . Avoid low BP  Long term BP goal 130-150 given left M2 high-grade stenosis.  Long-term BP goal normotensive after MCA stenting.  Hyperlipidemia  Home meds: Lipitor 20  LDL 61, goal < 70  Now on Lipitor 40  Continue statin at discharge  Other Stroke Risk Factors  Obesity, Body mass index is 39.16 kg/m.    Hospital day # 4  Beulah Gandy, DNP, ACNPC-AG     To contact Stroke Continuity provider, please refer to [email or web address] After hours, contact General Neurology

## 2020-07-27 NOTE — Discharge Summary (Signed)
Name: Sharon Blankenship MRN: [id number] DOB: 02-Mar-1966 55 y.o. PCP: [name], MD  Date of Admission: 07/23/2020 10:53 AM Date of Discharge: 07/30/2020 Attending Physician: Sid Falcon, MD  Discharge Diagnosis:  1. Left MCA stroke 2. Secondary open-angle glaucoma of both eyes, severe stage 3. Hypertension 4. DM (diabetes mellitus), type 2, uncontrolled with complications ([place]) 5. Hyperlipidemia 6. Mild concentric left ventricular hypertrophy (LVH) 7. Middle cerebral artery stenosis, left 8. Normocytic anemia  Discharge Medications: Allergies as of 07/30/2020      Reactions   Aloe Shortness Of Breath, Rash   SOB, Swelling, Rash, Itching   Penicillins Hives, Itching, Swelling   Did it involve swelling of the face/tongue/throat, SOB, or low BP? Y Did it involve sudden or severe rash/hives, skin peeling, or any reaction on the inside of your mouth or nose? N Did you need to seek medical attention at a hospital or doctor's office? Y When did it last happen? Several Years Ago If all above answers are "NO", may proceed with cephalosporin use.      Medication List    TAKE these medications   acetaZOLAMIDE 125 MG tablet Commonly known as: DIAMOX Take 2 tablets (250 mg total) by mouth in the morning, at noon, in the evening, and at bedtime. What changed: how much to take   aspirin 81 MG EC tablet Take 1 tablet (81 mg total) by mouth daily. Swallow whole. What changed: additional instructions   atorvastatin 40 MG tablet Commonly known as: LIPITOR Take 1 tablet (40 mg total) by mouth daily. Start taking on: July 31, 2020 What changed:   medication strength  how much to take   azelastine 0.1 % nasal spray Commonly known as: ASTELIN Place 1 spray into both nostrils 2 (two) times daily as needed for allergies.   brimonidine 0.2 % ophthalmic solution Commonly known as: ALPHAGAN Place 1 drop into both eyes 3 (three) times daily.   dorzolamide-timolol 22.3-6.8  MG/ML ophthalmic solution Commonly known as: COSOPT Place 1 drop into the right eye 2 (two) times daily.   ferrous sulfate 325 (65 FE) MG tablet Take 325 mg by mouth daily.   Fiasp FlexTouch 100 UNIT/ML FlexTouch Pen Generic drug: insulin aspart Inject 18 Units into the skin daily as needed (high blood sugar).   gabapentin 100 MG capsule Commonly known as: NEURONTIN Take 1 capsule (100 mg total) by mouth 3 (three) times daily as needed (nerve pain).   Insulin Pen Needle 32G X 4 MM Misc USE AS DIRECTED TO INJECT DIABETES MEDICATION 5 TIMES DAILY.   latanoprost 0.005 % ophthalmic solution Commonly known as: XALATAN Place 1 drop into both eyes at bedtime.   loratadine 10 MG tablet Commonly known as: CLARITIN Take 10 mg by mouth daily as needed for allergies.   losartan 100 MG tablet Commonly known as: Cozaar Take 1 tablet (100 mg total) by mouth daily.   meloxicam 15 MG tablet Commonly known as: MOBIC Take 15 mg by mouth daily as needed for pain.   metFORMIN 1000 MG tablet Commonly known as: GLUCOPHAGE Take 1 tablet (1,000 mg total) by mouth 2 (two) times daily with a meal.   methocarbamol 500 MG tablet Commonly known as: ROBAXIN Take 500 mg by mouth 3 (three) times daily as needed for muscle spasms.   montelukast 10 MG tablet Commonly known as: SINGULAIR TAKE 1 TABLET (10 MG TOTAL) BY MOUTH DAILY. What changed:   how much to take  when to take this   MULTIVITAMIN  PO Take 1 tablet by mouth daily.   Ozempic (1 MG/DOSE) 4 MG/3ML Sopn Generic drug: Semaglutide (1 MG/DOSE) Inject 1 mg into the skin once a week. On Wednesday   pantoprazole 40 MG tablet Commonly known as: Protonix Take 1 tablet (40 mg total) by mouth daily.   polyethylene glycol 17 g packet Commonly known as: MIRALAX / GLYCOLAX Take 17 g by mouth daily as needed for mild constipation.   ticagrelor 90 MG Tabs tablet Commonly known as: BRILINTA Take 1 tablet (90 mg total) by mouth 2 (two)  times daily.   Tyler Aas FlexTouch 200 UNIT/ML FlexTouch Pen Generic drug: insulin degludec INJECT 76 UNITS INTO THE SKIN DAILY.       Disposition and follow-up:   [name] was discharged from [facility] in Stable condition.  At the hospital follow up visit please address:  1.    Left MCA territory infarcts s/p stent 07/28/20 Had expressive dysphasia on admission which has since resolved.  Discharge medications: Lipitor INCREASED to 40mg , Brilinta 90 mg twice daily STARTED, continue aspirin 81mg  Follow up recommendations:  - Ensure patient follows up with interventional radiologist, Dr. Estanislado Pandy - Continue to work on mitigating risk factors  Essential hypertension Discharge medications: continue losartan 100 mg daily Follow-up recommendations - Blood pressure goal is normotensive  AKI, resolved Baseline serum creatinine appears to be 1-1.2. sCr 1.33 on day of discharge. Follow-up recommendations - repeat BMP   Glaucoma Diamox held during admission prior to stent placement. Resumed after stent placement. Patient scheduled for glaucoma surgery 08/13/20. Per ophthalmology, no need to hold DAPT prior to surgery. Per neurology, no indication to delay surgery unless patient was asked to hold DAPT. Discharge medications: continue Diamox and eye drops Follow-up recommendations - patient is scheduled for glaucoma surgery 08/13/20  Normocytic anemia Hemoglobin 8.9-10.4 during admission, 8.9 on discharge. Per chart review, patient on oral iron supplement at home. No sign or symptom of blood loss during admission. Suspect multifactorial 2/2 iron deficiency and/or anemia of chronic disease and/or post-procedure. Discharge medications: continue home oral iron supplementation Follow-up recommendations - repeat CBC - consider iron studies, anemia work-up  2.  Labs / imaging needed at time of follow-up: CBC, BMP  3.  Pending labs/ test needing follow-up:  none  Follow-up Appointments:  Follow-up Information    Luanne Bras, MD Follow up in 3 week(s).   Specialties: Interventional Radiology, Radiology Why: Please follow-up with Dr. Estanislado Pandy in clinic 2-3 weeks after discharge. Our schedulers will call you to set up this appointment. Contact information: [place] [address] [phone]        [name], MD. Schedule an appointment as soon as possible for a visit in 1 week(s).   Specialty: Internal Medicine Why: Someone from [facility] will call you to schedule this. Contact information: [place] [id number] [place] [facility] Course by problem list:  Shanaya Oborn a 55 y.o.with pertinent PMH of type 2 diabetes, hypertension, open-angle glaucoma, previous CVA(03/2020, multiple left MCA territory embolic stroke)with residual right-sided weaknesswho presented with difficulties with speechandadmitted for acute CVA.  Acute CVA, multiple left MCA territory subacute ischemic infarctions 2/2 MCA territory stenosis s/p cerebral arteriogram with revascularization of left MCA using stent-assisted angioplasty via R femoral approach 07/28/20 Hxmultiple left MCA territory embolic stroke (123XX123) 2/2 symptomatic MCA territory stenosis and hypertension Hx essential hypertension Patient back to her neurologic baseline  after initially presenting with difficulty with word finding. MRI head revealed short segment occlusion of the left MCA proximal M2 segment superior division. Moderate stenosis of the superior division of the right MCA proximal M2 segment. Echocardiogram demonstrated normal EF and mild LVH, no suggestion of cardioembolic source. Carotid ultrasound normal. Per neurology, given her repeat stroke, this presentation represented medical management failure, and stent placement was planned. It is suspected that Diamox could have predisposed to these strokes due to reversed robin  hood syndrome (RRHS) since increased intravascular CO2 causes dilation of healthy vessels, however will lead to steel syndrome if unhealthy vessels do not dilate. Reached out to patient's glaucoma specialist from [facility] who agreed risk of continuing Diamox prior to stent placement outweighed benefit of continuing it. Patient was continued on aspirin and started on Brilinta 90 mg twice daily. Her statin was intensified to atorvastatin 40 mg. Cerebral arteriogram with Dr. Estanislado Pandy (interventional neurology) showed severe left MCA superior division stenosis at the origin, and cerebral arteriogram with revascularization of left MCA using stent-assisted angioplasty via R femoral approach was performed by Dr. Estanislado Pandy on 07/28/20. Patient tolerated the procedure well. Was initially placed on Cleviprex drip which was weaned, and she was continued on her home antihypertensive (losartan 100 mg daily). Patient was evaluated by PT/OT who recommended HH PT. Unfortunately, given her lack of insurance, charity HH is the main option and will be arranged by social work if possible. Patient's Diamox was resumed prior to discharge home. She will be scheduled for close follow-up with Dr. Estanislado Pandy.  AKI Serum creatinine 1.49>1.17>1.13>1.03>1.45>1.33 this admission. Baseline appears to be 1-1.2. Suspect pre-renal in setting of mild dehydration initially followed by multiple contrast-based studies.  Type 2 diabetes 07/24/20 A1c 7.7%. Current regimen of Tresiba 76 units daily, Ozempic 1 mg daily, metformin 1000 mg twice daily. Fiasp 18 units daily as needed. Her diabetes was managed with Lantus 50 units daily and SSI. Continued on home regimen at discharge.  Glaucoma  As above, patient has history of glaucoma for which she was treated with Diamox and several drops. Diamox was held initially however resumed after stent placement. She is scheduled for glaucoma surgery at [facility] on 08/13/20. Per her ophthalmologist,  she will not need to stop DAPT prior to surgery.  ~~~~~~~~~~~~~~~~~~~~~~~~~~~~~~~~~~~~~~~~~~~~~~~~~~~~~~~~~~~~~~~~~~~~~~~~~~~~~~~~~~~~~~~  Day of Discharge Subjective:  No acute events overnight.   During evaluation this morning, patient states she is feeling "fine." She endorses feeling better because the lines/wires she was connected to yesterday were removed. She endorses feeling mild R arm heaviness. Denies lightheadedness, dizziness, numbness, tingling, speech difficulty. She states her sister would be the person to transport her home.  Discharge Exam:   BP (!) 152/79   Pulse 93   Temp 98.4 F (36.9 C) (Oral)   Resp 15   Ht 5\' 7"  (1.702 m)   Wt 113.4 kg   LMP 04/24/2014   SpO2 100%   BMI 39.16 kg/m  Constitutional: tired-appearing woman lying in bed, awake and alert, in no acute distress HENT: normocephalic atraumatic, mucous membranes moist Cardiovascular: regular rate and rhythm, no m/r/g Pulmonary/Chest: normal work of breathing on room air Neurological: alert & oriented to person, place, time, and situation. 5/5 strength in bilateral upper extremities. No facial droop. Sensation grossly intact. Psych: flat affect  Pertinent Labs, Studies, and Procedures:   MR ANGIO HEAD WO CONTRAST  Result Date: 07/24/2020 CLINICAL DATA:  Difficulty speaking EXAM: MRA HEAD WITHOUT CONTRAST TECHNIQUE: Angiographic images of the Circle of Willis were  obtained using MRA technique without intravenous contrast. COMPARISON:  Brain MRI 07/23/2020 FINDINGS: POSTERIOR CIRCULATION: --Vertebral arteries: Normal --Inferior cerebellar arteries: Normal. --Basilar artery: Normal. --Superior cerebellar arteries: Normal. --Posterior cerebral arteries: Normal. ANTERIOR CIRCULATION: --Intracranial internal carotid arteries: Normal. --Anterior cerebral arteries (ACA): Normal. --Middle cerebral arteries (MCA): There is short segment occlusion of the left MCA proximal M2 segments superior division. Moderate  stenosis of the superior division of the right MCA proximal M2 segment. ANATOMIC VARIANTS: None IMPRESSION: 1. Short segment occlusion of the left MCA proximal M2 segment superior division. 2. Moderate stenosis of the superior division of the right MCA proximal M2 segment. 3. These results were called by telephone at the time of interpretation on 07/24/2020 at 1:03 am to provider [facility] , who verbally acknowledged these results. These results were communicated to Dr. Kerney Elbe at 1:03 am on 07/24/2020 by text page via the [facility] messaging system. Electronically Signed   By: Ulyses Jarred M.D.   On: 07/24/2020 01:04   MR BRAIN WO CONTRAST  Result Date: 07/23/2020 CLINICAL DATA:  Neurological deficit.  Acute stroke suspected. EXAM: MRI HEAD WITHOUT CONTRAST TECHNIQUE: Multiplanar, multiecho pulse sequences of the brain and surrounding structures were obtained without intravenous contrast. COMPARISON:  CT 03/22/2020.  MRI 03/21/2020. FINDINGS: Brain: There are several acute infarctions within the left hemisphere extending from the posterior frontal cortical and subcortical brain to the parieto-occipital cortical and subcortical brain. Findings could be watershed or embolic. Mild swelling of the regions of acute infarction. Petechial blood products in the left parieto-occipital region infarctions. Minimal petechial blood products in the largest region of posterior frontal infarction. No focal abnormality affects the brainstem or cerebellum. Right cerebral hemisphere shows a few old small vessel infarctions. Left cerebral hemisphere shows old watershed infarctions which were acute in December of 2021. No hydrocephalus or extra-axial collection. Vascular: Major vessels at the base of the brain show flow. Skull and upper cervical spine: Negative Sinuses/Orbits: Clear/normal Other: None IMPRESSION: 1. Several acute infarctions in the left hemisphere extending from the posterior frontal cortical and  subcortical brain to the parieto-occipital cortical and subcortical brain. Findings could be watershed or embolic. Petechial blood products in the largest region of posterior frontal infarction and left parieto-occipital infarction. 2. 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 Electronically Signed   By: Nelson Chimes M.D.   On: 07/23/2020 17:28   VAS [place] ABI WITH/WO TBI  Result Date: 07/24/2020 LOWER EXTREMITY DOPPLER STUDY Indications: LLE "cool" - BLE warm to touch upon examination. High Risk Factors: Hypertension, hyperlipidemia, Diabetes, no history of                    smoking, prior CVA.  Comparison Study: No previous exam Performing Technologist: Hill, Jody RVT, RDMS  Examination Guidelines: A complete evaluation includes at minimum, Doppler waveform signals and systolic blood pressure reading at the level of bilateral brachial, anterior tibial, and posterior tibial arteries, when vessel segments are accessible. Bilateral testing is considered an integral part of a complete examination. Photoelectric Plethysmograph (PPG) waveforms and toe systolic pressure readings are included as required and additional duplex testing as needed. Limited examinations for reoccurring indications may be performed as noted.  ABI Findings: +---------+------------------+-----+---------+---------------+ Right    Rt Pressure (mmHg)IndexWaveform Comment         +---------+------------------+-----+---------+---------------+ Brachial 145                    triphasic                +---------+------------------+-----+---------+---------------+  PTA                             biphasic noncompressible +---------+------------------+-----+---------+---------------+ DP       232               1.60 triphasic                +---------+------------------+-----+---------+---------------+ Great Toe153               1.06 Normal                   +---------+------------------+-----+---------+---------------+  +---------+------------------+-----+---------+---------------------------------+ Left     Lt Pressure (mmHg)IndexWaveform Comment                           +---------+------------------+-----+---------+---------------------------------+ Brachial 143                    biphasic                                   +---------+------------------+-----+---------+---------------------------------+ PTA                             triphasicnoncompressible                   +---------+------------------+-----+---------+---------------------------------+ DP       226               1.56 triphasic                                  +---------+------------------+-----+---------+---------------------------------+ Great Toe101               0.70 Normal   borderline normal to mild small                                            vessel disease                    +---------+------------------+-----+---------+---------------------------------+  Summary: Right: Resting right ankle-brachial index indicates noncompressible right lower extremity arteries. The right toe-brachial index is normal. Left: Resting left ankle-brachial index indicates noncompressible left lower extremity arteries. The left toe-brachial index is normal.  *See table(s) above for measurements and observations.  Electronically signed by Harold Barban MD on 07/24/2020 at 9:28:50 PM.   Final    ECHOCARDIOGRAM COMPLETE  Result Date: 07/24/2020    ECHOCARDIOGRAM REPORT   Patient Name:   ALLAN URBANIAK Date of Exam: 07/24/2020 Medical Rec #:  [id number]     Height:       67.0 in Accession #:    [id number]    Weight:       250.0 lb Date of Birth:  09-19-1965     BSA:          2.223 m Patient Age:    51 years      BP:           158/89 mmHg Patient Gender: F             HR:           93 bpm. Exam Location:  Inpatient  Procedure: 2D Echo, Cardiac Doppler and Color Doppler Indications:    CVA  History:        Patient has prior history of  Echocardiogram examinations, most                 recent 03/23/2020. Stroke; Risk Factors:Hypertension and                 Diabetes.  Sonographer:    Luisa Hart RDCS Referring Phys: [id number] Courtney Paris  Sonographer Comments: Patient is morbidly obese. No cardiac surgeries noted on chart IMPRESSIONS  1. Abnormal septal motion . Left ventricular ejection fraction, by estimation, is 55 to 60%. The left ventricle has normal function. The left ventricle has no regional wall motion abnormalities. There is mild left ventricular hypertrophy. Left ventricular diastolic parameters were normal.  2. Right ventricular systolic function is normal. The right ventricular size is normal. There is normal pulmonary artery systolic pressure.  3. The mitral valve is abnormal. No evidence of mitral valve regurgitation. No evidence of mitral stenosis.  4. The aortic valve is tricuspid. Aortic valve regurgitation is not visualized. Mild to moderate aortic valve sclerosis/calcification is present, without any evidence of aortic stenosis.  5. The inferior vena cava is normal in size with greater than 50% respiratory variability, suggesting right atrial pressure of 3 mmHg. FINDINGS  Left Ventricle: Abnormal septal motion. Left ventricular ejection fraction, by estimation, is 55 to 60%. The left ventricle has normal function. The left ventricle has no regional wall motion abnormalities. The left ventricular internal cavity size was normal in size. There is mild left ventricular hypertrophy. Left ventricular diastolic parameters were normal. Right Ventricle: The right ventricular size is normal. No increase in right ventricular wall thickness. Right ventricular systolic function is normal. There is normal pulmonary artery systolic pressure. The tricuspid regurgitant velocity is 2.48 m/s, and  with an assumed right atrial pressure of 3 mmHg, the estimated right ventricular systolic pressure is 0000000 mmHg. Left Atrium: Left atrial size  was normal in size. Right Atrium: Right atrial size was normal in size. Pericardium: There is no evidence of pericardial effusion. Mitral Valve: The mitral valve is abnormal. There is moderate thickening of the mitral valve leaflet(s). There is moderate calcification of the mitral valve leaflet(s). Mild mitral annular calcification. No evidence of mitral valve regurgitation. No evidence of mitral valve stenosis. Tricuspid Valve: The tricuspid valve is normal in structure. Tricuspid valve regurgitation is trivial. No evidence of tricuspid stenosis. Aortic Valve: The aortic valve is tricuspid. Aortic valve regurgitation is not visualized. Mild to moderate aortic valve sclerosis/calcification is present, without any evidence of aortic stenosis. Aortic valve mean gradient measures 4.0 mmHg. Aortic valve peak gradient measures 8.9 mmHg. Pulmonic Valve: The pulmonic valve was normal in structure. Pulmonic valve regurgitation is trivial. No evidence of pulmonic stenosis. Aorta: The aortic root is normal in size and structure. Venous: The inferior vena cava is normal in size with greater than 50% respiratory variability, suggesting right atrial pressure of 3 mmHg. IAS/Shunts: The interatrial septum was not well visualized.  LEFT VENTRICLE PLAX 2D LVIDd:         3.20 cm     Diastology LVIDs:         2.00 cm     LV e' medial:    5.66 cm/s LV PW:         1.40 cm     LV E/e' medial:  13.9 LV IVS:  1.25 cm     LV e' lateral:   7.72 cm/s                            LV E/e' lateral: 10.2  LV Volumes (MOD) LV vol d, MOD A2C: 51.1 ml LV vol d, MOD A4C: 33.4 ml LV vol s, MOD A2C: 27.4 ml LV vol s, MOD A4C: 15.2 ml LV SV MOD A2C:     23.7 ml LV SV MOD A4C:     33.4 ml LV SV MOD BP:      22.0 ml RIGHT VENTRICLE RV S prime:     11.10 cm/s TAPSE (M-mode): 1.6 cm LEFT ATRIUM             Index       RIGHT ATRIUM          Index LA diam:        2.90 cm 1.30 cm/m  RA Area:     7.14 cm LA Vol (A2C):   25.9 ml 11.65 ml/m RA Volume:    10.20 ml 4.59 ml/m LA Vol (A4C):   32.8 ml 14.75 ml/m LA Biplane Vol: 29.5 ml 13.27 ml/m  AORTIC VALVE                   PULMONIC VALVE AV Vmax:           149.00 cm/s PV Vmax:       0.87 m/s AV Vmean:          95.800 cm/s PV Vmean:      71.200 cm/s AV VTI:            0.238 m     PV VTI:        0.199 m AV Peak Grad:      8.9 mmHg    PV Peak grad:  3.0 mmHg AV Mean Grad:      4.0 mmHg    PV Mean grad:  2.0 mmHg LVOT Vmax:         93.30 cm/s LVOT Vmean:        55.700 cm/s LVOT VTI:          0.147 m LVOT/AV VTI ratio: 0.62  AORTA Ao Root diam: 3.40 cm Ao Asc diam:  3.20 cm MITRAL VALVE                TRICUSPID VALVE MV Area (PHT): 3.83 cm     TR Peak grad:   24.6 mmHg MV Decel Time: 198 msec     TR Vmax:        248.00 cm/s MV E velocity: 78.80 cm/s MV A velocity: 108.00 cm/s  SHUNTS MV E/A ratio:  0.73         Systemic VTI: 0.15 m Jenkins Rouge MD Electronically signed by Jenkins Rouge MD Signature Date/Time: 07/24/2020/11:57:51 AM    Final    VAS US CAROTID (at [facility] and [facility] only)  Result Date: 07/24/2020 Carotid Arterial Duplex Study Indications:       CVA and Speech disturbance. Risk Factors:      Hypertension, hyperlipidemia, Diabetes, no history of                    smoking, prior CVA. Limitations        Today's exam was limited due to the body habitus of the  patient. Comparison Study:  No previous exams. CTA performed 03/22/20. Performing Technologist: Rogelia Rohrer  Examination Guidelines: A complete evaluation includes B-mode imaging, spectral Doppler, color Doppler, and power Doppler as needed of all accessible portions of each vessel. Bilateral testing is considered an integral part of a complete examination. Limited examinations for reoccurring indications may be performed as noted.  Right Carotid Findings: +----------+--------+--------+--------+------------------+------------------+           PSV cm/sEDV cm/sStenosisPlaque DescriptionComments            +----------+--------+--------+--------+------------------+------------------+ CCA Prox  91      18                                                   +----------+--------+--------+--------+------------------+------------------+ CCA Distal62      17                                intimal thickening +----------+--------+--------+--------+------------------+------------------+ ICA Prox  80      30                                                   +----------+--------+--------+--------+------------------+------------------+ ICA Distal57      22                                                   +----------+--------+--------+--------+------------------+------------------+ ECA       92      14                                                   +----------+--------+--------+--------+------------------+------------------+ +----------+--------+-------+----------------+-------------------+           PSV cm/sEDV cmsDescribe        Arm Pressure (mmHG) +----------+--------+-------+----------------+-------------------+ [id number]             Multiphasic, WNL                    +----------+--------+-------+----------------+-------------------+ +---------+--------+--+--------+--+---------+ VertebralPSV cm/s41EDV cm/s12Antegrade +---------+--------+--+--------+--+---------+  Left Carotid Findings: +----------+--------+--------+--------+------------------+------------------+           PSV cm/sEDV cm/sStenosisPlaque DescriptionComments           +----------+--------+--------+--------+------------------+------------------+ CCA Prox  77      14                                intimal thickening +----------+--------+--------+--------+------------------+------------------+ CCA Distal68      14                                intimal thickening +----------+--------+--------+--------+------------------+------------------+ ICA Prox  70      27                                                    +----------+--------+--------+--------+------------------+------------------+  ICA Distal81      33                                                   +----------+--------+--------+--------+------------------+------------------+ ECA       76      11                                                   +----------+--------+--------+--------+------------------+------------------+ +----------+--------+--------+----------------+-------------------+           PSV cm/sEDV cm/sDescribe        Arm Pressure (mmHG) +----------+--------+--------+----------------+-------------------+ [id number]              Multiphasic, WNL                    +----------+--------+--------+----------------+-------------------+ +---------+--------+--+--------+--+---------+ VertebralPSV cm/s58EDV cm/s14Antegrade +---------+--------+--+--------+--+---------+   Summary: Right Carotid: The extracranial vessels were near-normal with only minimal wall                thickening or plaque. Left Carotid: The extracranial vessels were near-normal with only minimal wall               thickening or plaque. Vertebrals:  Bilateral vertebral arteries demonstrate antegrade flow. Subclavians: Normal flow hemodynamics were seen in bilateral subclavian              arteries. *See table(s) above for measurements and observations.  Electronically signed by Harold Barban MD on 07/24/2020 at 9:28:20 PM.    Final     Discharge Instructions: Discharge Instructions    Face-to-face encounter (required for Medicare/Medicaid patients)   Complete by: As directed    I Alexandria Lodge certify that this patient is under my care and that I, or a nurse practitioner or physician's assistant working with me, had a face-to-face encounter that meets the physician face-to-face encounter requirements with this patient on 07/30/2020. The encounter with the patient was in whole, or in part for the following medical condition(s)  which is the primary reason for home health care (List medical condition): stroke, deconditioning   The encounter with the patient was in whole, or in part, for the following medical condition, which is the primary reason for home health care: stroke, deconditioning   I certify that, based on my findings, the following services are medically necessary home health services: Physical therapy   Reason for Medically Necessary Home Health Services:  Skilled Nursing- Skilled Assessment/Observation Therapy- Personnel officer, Public librarian Therapy- Home Adaptation to Facilitate Safety     My clinical findings support the need for the above services: OTHER SEE COMMENTS   Further, I certify that my clinical findings support that this patient is homebound due to: Unsafe ambulation due to balance issues   Home Health   Complete by: As directed    To provide the following care/treatments: PT     Ms. Manton,  It was a pleasure taking care of you in the hospital. You were admitted after having a stroke. You were treated by neurologists and interventional radiologists. A stent was put in your brain to improve blood flow. You will continue taking aspirin as before, and you will also take another blood thinner which was started in  the hospital, ticagrelor (Brilinta). Your statin dose was increased to atorvastatin (Lipitor) 40 mg daily. We held your Diamox initially, however now that you have the stent in place, it was restarted. You are safe to proceed with your glaucoma surgery on 08/13/20. You will be scheduled for follow-up with Dr. Estanislado Pandy (interventional radiologist, the specialist who placed your stent). Someone from clinic will also call you to schedule hospital follow-up.  Take care!  Signed: Alexandria Lodge, MD 07/30/2020, 10:25 AM   Pager: [phone]

## 2020-07-28 ENCOUNTER — Encounter (HOSPITAL_COMMUNITY): Payer: Self-pay | Admitting: Internal Medicine

## 2020-07-28 ENCOUNTER — Inpatient Hospital Stay (HOSPITAL_COMMUNITY): Payer: Medicaid Other

## 2020-07-28 ENCOUNTER — Inpatient Hospital Stay (HOSPITAL_COMMUNITY): Payer: Medicaid Other | Admitting: Anesthesiology

## 2020-07-28 ENCOUNTER — Encounter (HOSPITAL_COMMUNITY)
Admission: EM | Disposition: A | Discharge status: Home Health | Payer: Self-pay | Source: Home / Self Care | Attending: Internal Medicine

## 2020-07-28 DIAGNOSIS — I6602 Occlusion and stenosis of left middle cerebral artery: Secondary | ICD-10-CM | POA: Diagnosis present

## 2020-07-28 HISTORY — PX: RADIOLOGY WITH ANESTHESIA: SHX6223

## 2020-07-28 HISTORY — PX: IR CT HEAD LTD: IMG2386

## 2020-07-28 HISTORY — PX: IR INTRA CRAN STENT: IMG2345

## 2020-07-28 LAB — GLUCOSE, CAPILLARY
Glucose-Capillary: 175 mg/dL — ABNORMAL HIGH (ref 70–99)
Glucose-Capillary: 190 mg/dL — ABNORMAL HIGH (ref 70–99)
Glucose-Capillary: 379 mg/dL — ABNORMAL HIGH (ref 70–99)
Glucose-Capillary: 429 mg/dL — ABNORMAL HIGH (ref 70–99)

## 2020-07-28 LAB — CBC WITH DIFFERENTIAL/PLATELET
Abs Immature Granulocytes: 0.02 10*3/uL (ref 0.00–0.07)
Basophils Absolute: 0 10*3/uL (ref 0.0–0.1)
Basophils Relative: 1 %
Eosinophils Absolute: 0.1 10*3/uL (ref 0.0–0.5)
Eosinophils Relative: 2 %
HCT: 29.1 % — ABNORMAL LOW (ref 36.0–46.0)
Hemoglobin: 9.4 g/dL — ABNORMAL LOW (ref 12.0–15.0)
Immature Granulocytes: 0 %
Lymphocytes Relative: 34 %
Lymphs Abs: 2.5 10*3/uL (ref 0.7–4.0)
MCH: 26.3 pg (ref 26.0–34.0)
MCHC: 32.3 g/dL (ref 30.0–36.0)
MCV: 81.3 fL (ref 80.0–100.0)
Monocytes Absolute: 0.5 10*3/uL (ref 0.1–1.0)
Monocytes Relative: 7 %
Neutro Abs: 4 10*3/uL (ref 1.7–7.7)
Neutrophils Relative %: 56 %
Platelets: 273 10*3/uL (ref 150–400)
RBC: 3.58 MIL/uL — ABNORMAL LOW (ref 3.87–5.11)
RDW: 14.7 % (ref 11.5–15.5)
WBC: 7.1 10*3/uL (ref 4.0–10.5)
nRBC: 0 % (ref 0.0–0.2)

## 2020-07-28 LAB — PLATELET INHIBITION P2Y12: Platelet Function  P2Y12: 19 [PRU] — ABNORMAL LOW (ref 182–335)

## 2020-07-28 LAB — POCT ACTIVATED CLOTTING TIME
Activated Clotting Time: 255 seconds
Activated Clotting Time: 261 seconds

## 2020-07-28 LAB — BASIC METABOLIC PANEL
Anion gap: 7 (ref 5–15)
BUN: 12 mg/dL (ref 6–20)
CO2: 20 mmol/L — ABNORMAL LOW (ref 22–32)
Calcium: 8.8 mg/dL — ABNORMAL LOW (ref 8.9–10.3)
Chloride: 114 mmol/L — ABNORMAL HIGH (ref 98–111)
Creatinine, Ser: 1.03 mg/dL — ABNORMAL HIGH (ref 0.44–1.00)
GFR, Estimated: >60 mL/min (ref >60)
Glucose, Bld: 202 mg/dL — ABNORMAL HIGH (ref 70–99)
Potassium: 3.7 mmol/L (ref 3.5–5.1)
Sodium: 141 mmol/L (ref 135–145)

## 2020-07-28 LAB — PROTIME-INR
INR: 1.1 (ref 0.8–1.2)
Prothrombin Time: 14.3 seconds (ref 11.4–15.2)

## 2020-07-28 IMAGING — DX DG CHEST 1V PORT
1 series · 1 of 1 positions shown · non-contrast
Comparison: [DATE].

CLINICAL DATA: New right IJ CVC

EXAM:
PORTABLE CHEST 1 VIEW

[chest ap]
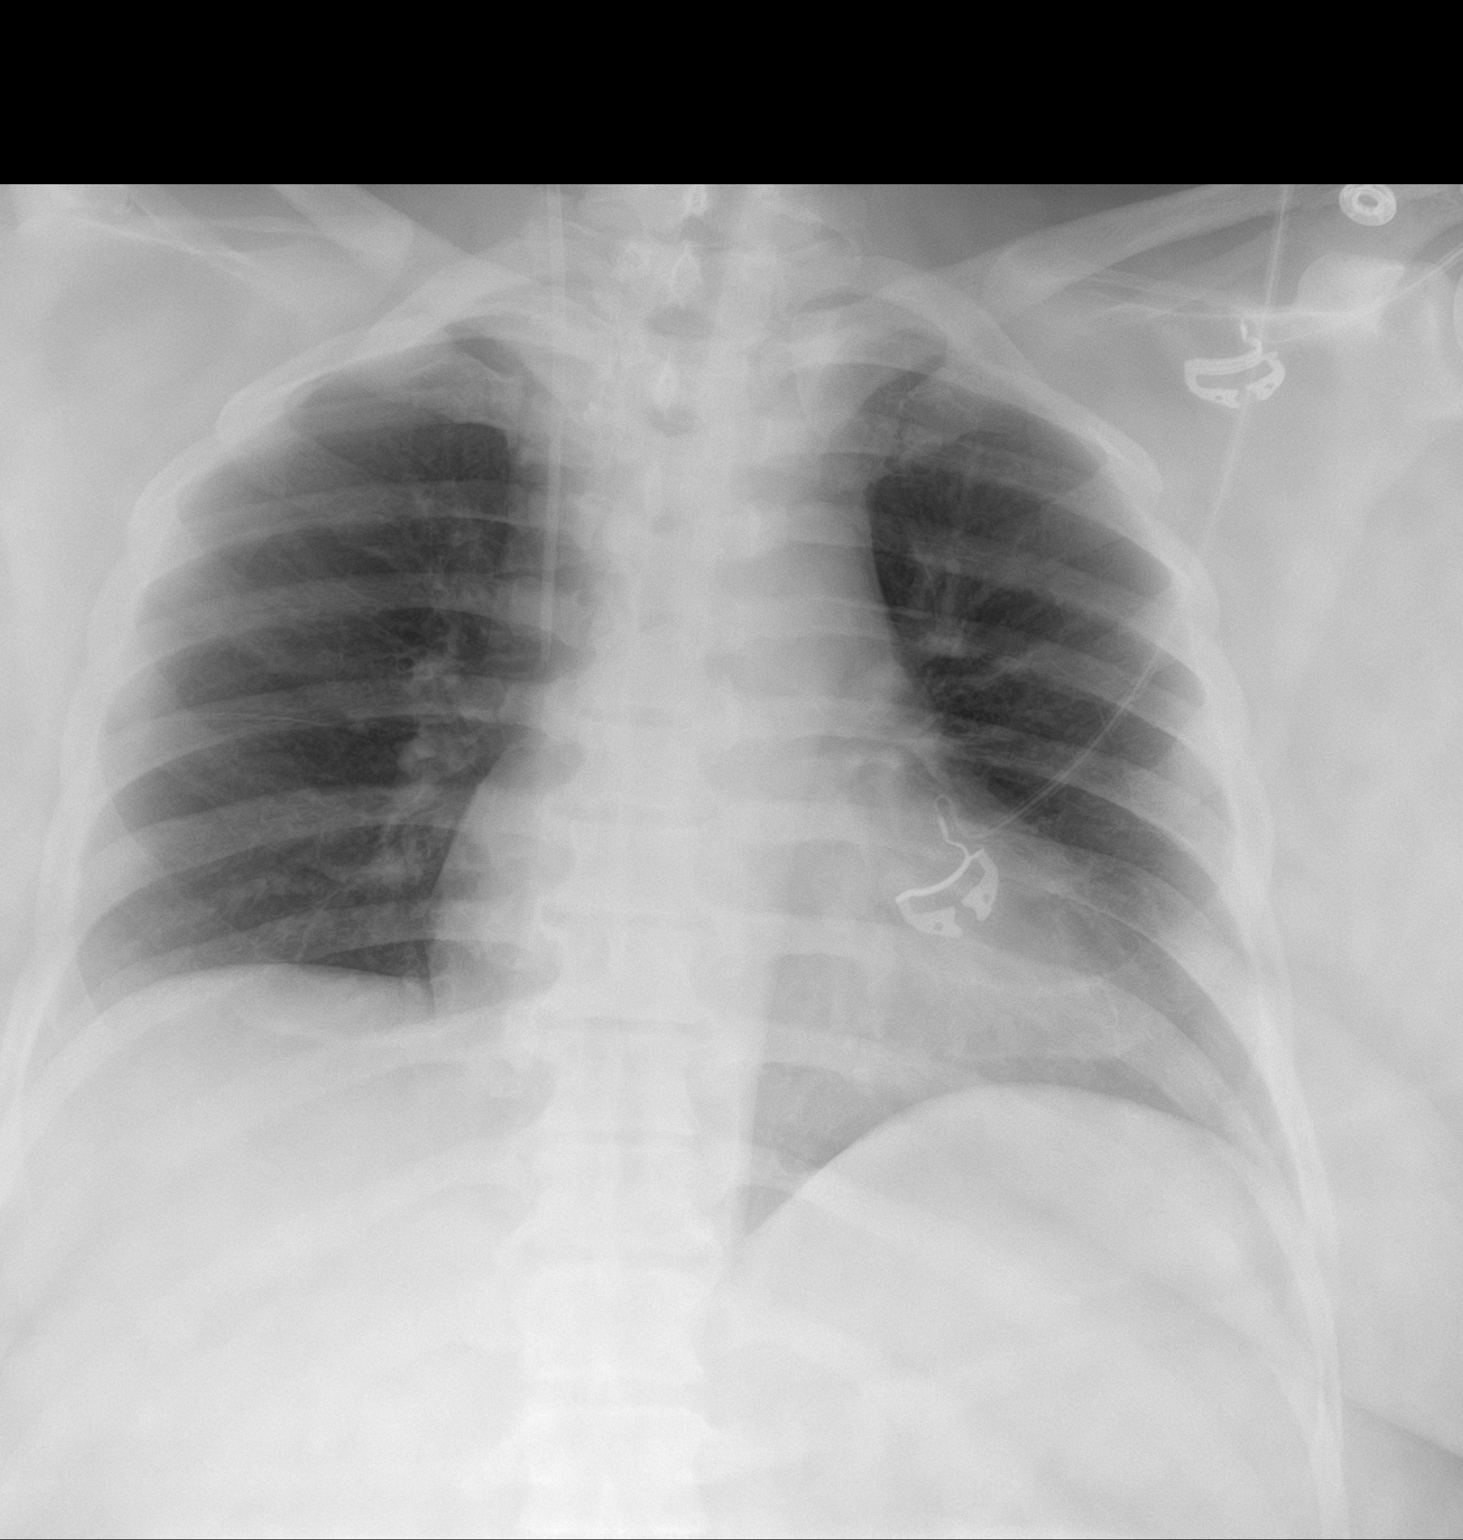

[1 of 1 positions shown; findings below may reference images not displayed]

FINDINGS: Right internal jugular central venous catheter with tip overlying
the SVC. The heart size and mediastinal contours are within normal
limits. No focal consolidation. No pleural effusion. No visible
pneumothorax. Elevation the right hemidiaphragm. The visualized
skeletal structures are unremarkable.
IMPRESSION: Right internal jugular central venous catheter with tip overlying
the SVC. No visible pneumothorax.

## 2020-07-28 IMAGING — XA IR INTRACRANIAL STENT (INCL PTA)
4 of 5 series · 13 of 24 positions shown · IV contrast (IODINE)
Comparison: Diagnostic arteriogram [DATE].

CLINICAL DATA: Symptomatic left middle cerebral artery severe
stenosis. Recurrent TIAs involving speech, and right-sided weakness
and numbness despite being on antiplatelets.

EXAM:
INTRACRANIAL STENT (INCL PTA)
TECHNIQUE: Informed written consent was obtained from the patient after a
thorough discussion of the procedural risks, benefits and
alternatives. All questions were addressed. Maximal Sterile Barrier
Technique was utilized including caps, mask, sterile gowns, sterile
gloves, sterile drape, hand hygiene and skin antiseptic. A timeout
was performed prior to the initiation of the procedure.

[Series 3: cerebral · 1 of 1 slices shown]
[im 1/1]
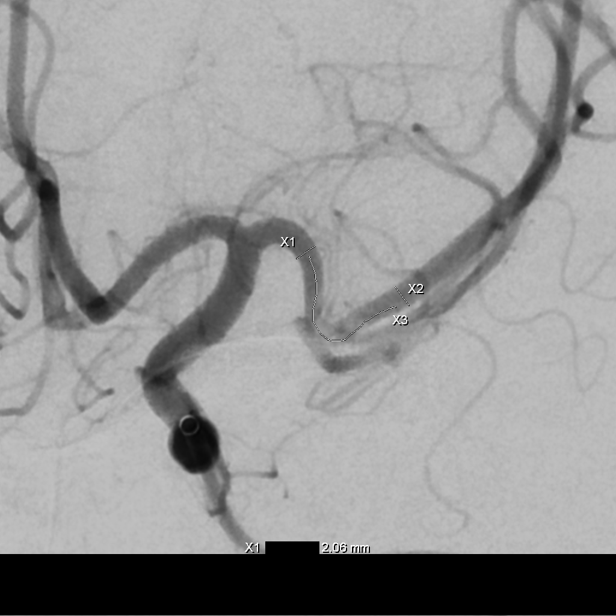

[Series 15: coronal · axial · 5.0mm · 0.29mm/px · z∈[-215,-5]mm · 5 of 38 slices shown]
[im 1/38  full-range]
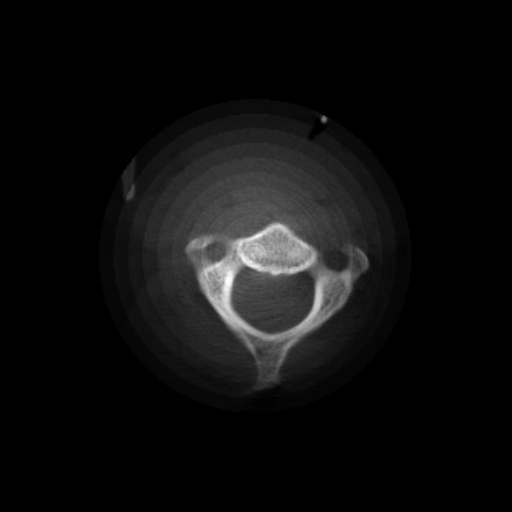
[im 10/38]
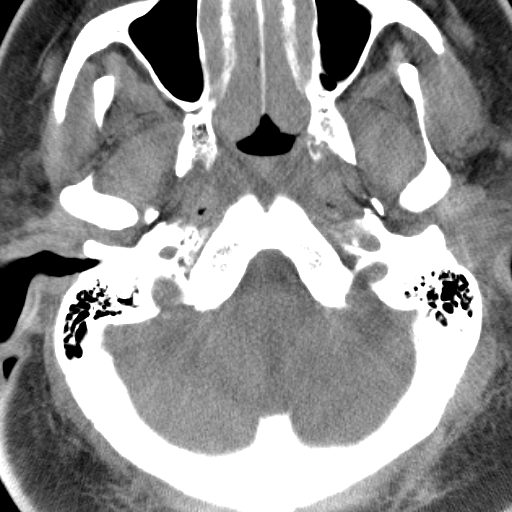
[im 19/38]
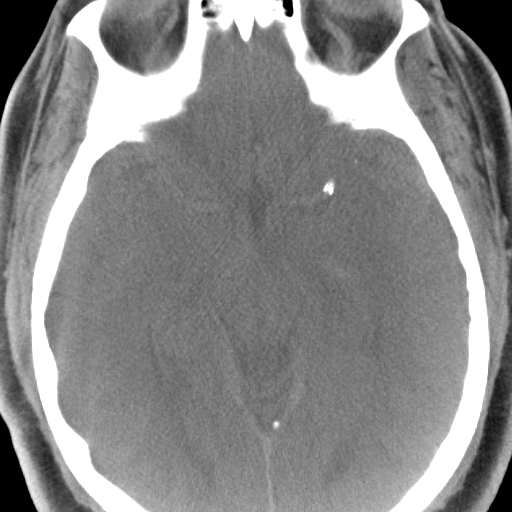
[im 28/38]
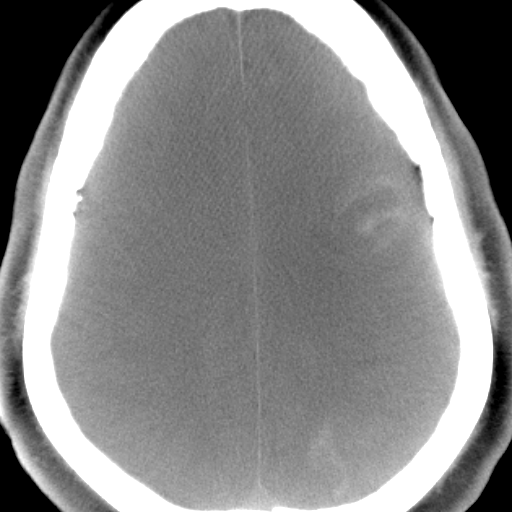
[im 38/38]
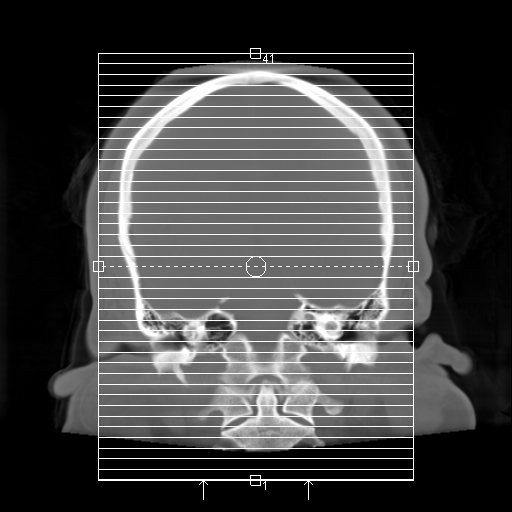

[Series 15: sagital · axial · 5.0mm · 0.38mm/px · z∈[-190,-60]mm · 4 of 38 slices shown]
[im 6/38]
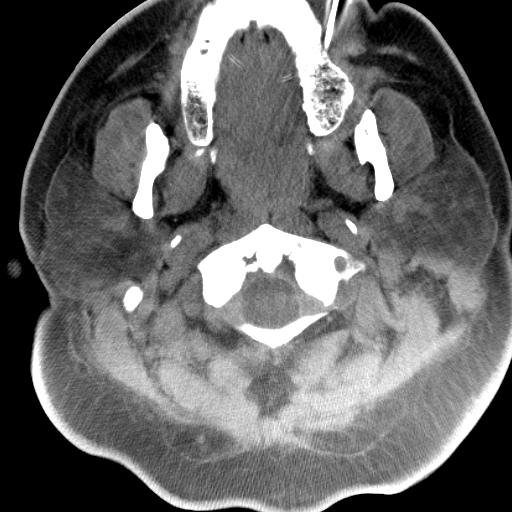
[im 11/38]
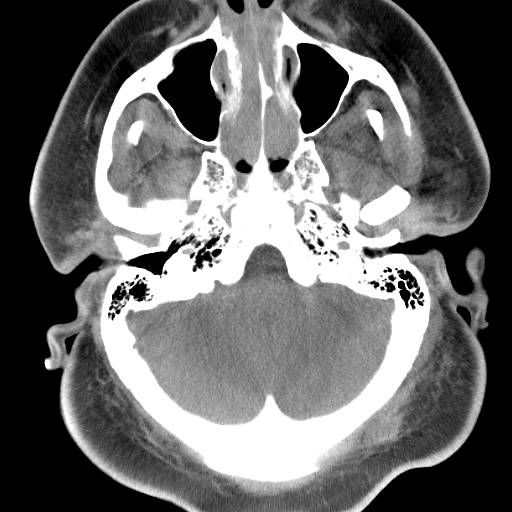
[im 22/38]
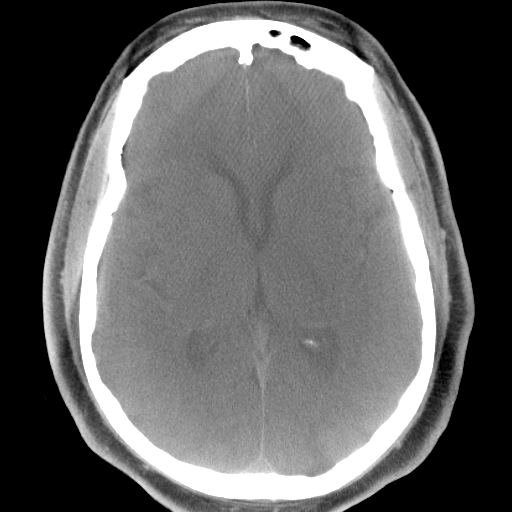
[im 32/38]
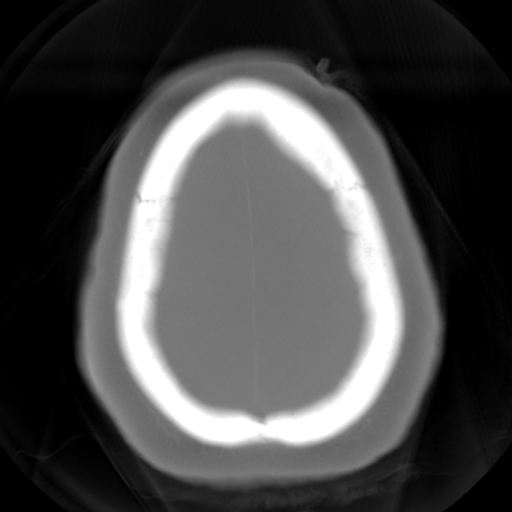

[Series 300: dsa body · 3 of 22 slices shown]
[im 1/22]
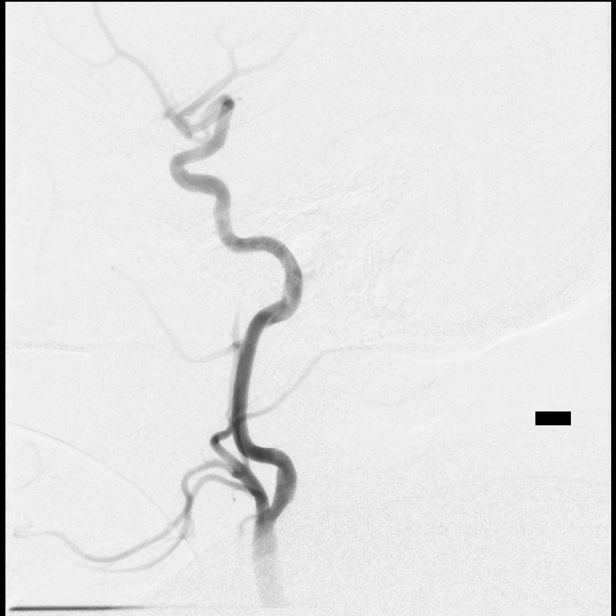
[im 11/22]
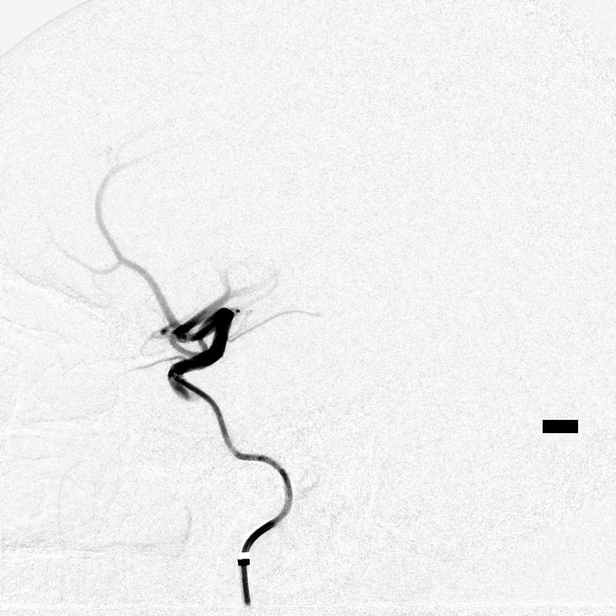
[im 22/22]
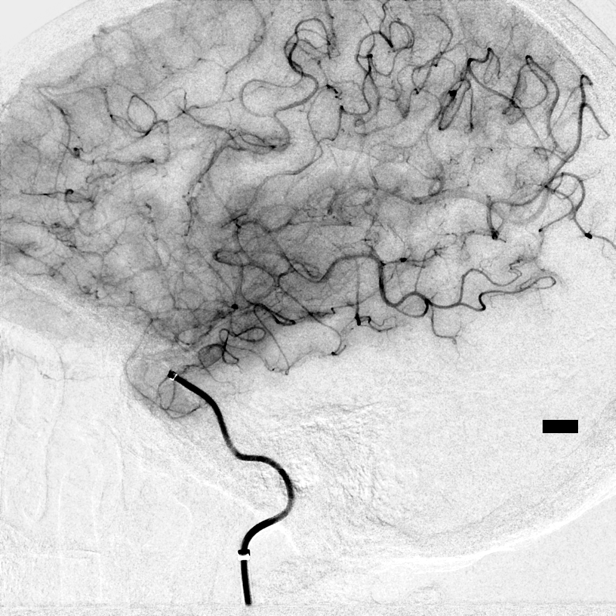

[13 of 24 positions shown; findings below may reference images not displayed]

MEDICATIONS:
Heparin 3,000 units IV. Vancomycin 1 g IV antibiotic was
administered within 1 hour of the procedure.

ANESTHESIA/SEDATION:
General anesthesia.

CONTRAST:  Isovue 300 approximately 90 mL.

FLUOROSCOPY TIME:  Fluoroscopy Time: 26 minutes 6 seconds ([ZR]
mGy).

COMPLICATIONS:
None immediate.
The right groin was prepped and draped in the usual sterile fashion.
Thereafter using modified Seldinger technique, transfemoral access
into the right common femoral artery was obtained without
difficulty. Over a 0.035 inch guidewire, an 8 French 25 Pinnacle
sheath was inserted. Through this, and also over 0.035 inch
guidewire, a 5 French JB 1 catheter was advanced to the aortic arch
region and selectively positioned left common carotid artery.
FINDINGS: The left common arteriogram demonstrates the left external carotid
artery and its major branches to be widely patent.

The left internal carotid artery at the bulb has approximately 50%
stenosis secondary to a smooth atherosclerotic plaque. The vessel
opacifies to the cranial skull base. The petrous, cavernous and
supraclinoid segments are widely patent.

The left middle cerebral artery again demonstrates a severe tapered
stenosis of the distal left middle cerebral artery at the
trifurcation extending into the origin of the dominant superior
division of the left middle cerebral artery. Hemodynamically slow
flow is seen in the left MCA distribution compared to the left
anterior cerebral artery distribution. The left anterior cerebral
artery opacifies into the capillary and venous phases.

ENDOVASCULAR REVASCULARIZATION OF THE SEVERE SYMPTOMATIC STENOSIS OF
THE DISTAL LEFT MIDDLE CEREBRAL ARTERY M1 SEGMENT EXTENDING INTO THE
DOMINANT SUPERIOR DIVISION.

The diagnostic JB 1 catheter in the left common carotid artery was
then exchanged over a 0.035 inch 300 cm Rosen exchange guidewire for
a 95 cm 6 French Infinity sheath. The guidewire was removed. Good
aspiration obtained from the hub of the Infinity sheath. A gentle
control arteriogram performed through the sheath demonstrated no
change in the left common carotid artery bifurcation.

Over a 0.035 inch Roadrunner guidewire a 5 French 115 cm Catalyst
guide catheter was advanced to the proximal cavernous segment of the
left internal carotid artery. The guidewire was removed. Good
aspiration obtained from the hub of the Catalyst guide catheter.
Control arteriogram performed through this guide demonstrated no
change in the left intracranial or cerebral circulation.

Magnified oblique views were then obtained. Measurements are
performed of the left middle cerebral artery distal M1 segment, and
the proximal inferior division.

It was decided to proceed with placement of a 2.0 mm x 15 mm
Resolute Onyx balloon mounted stent.

Over a 0.014 inches standard Synchro micro guidewire, a 150 cm 021
Headway microcatheter was advanced to the supraclinoid left ICA.

The micro guidewire was then gently manipulated into the left middle
cerebral artery followed by the microcatheter and advanced without
difficulty to the distal M2 M3 segment of the inferior division
followed by the microcatheter. The guidewire was removed. Good
aspiration obtained from the hub of the microcatheter. Gentle
contrast injection demonstrated safe position of the tip of the
microcatheter. The microcatheter was then exchanged for a 300 cm 014
inch Zoom exchange micro guidewire with a mild J configuration.

The microcatheter was removed. Control arteriogram performed through
the 5 French Catalyst guide catheter now demonstrated more clearly
the severe focal stenosis of the left middle cerebral artery
proximal superior division.

The 2.0 mm x 15 mm Resolute Onyx stent was then purged retrogradely
with heparinized saline infusion, and 50% contrast and 50%
heparinized saline infusion antegradely.

It was then advanced using the rapid exchange technique, to the
supraclinoid right ICA.

The 5 French Catalyst guide catheter was advanced into the proximal
left middle cerebral M1 segment.

The balloon mounted stent was then advanced and positioned with the
distal and proximal markers on the stent adequate distance from the
site of the severe stenosis.

Control inflation was then performed using a micro inflation syringe
device via micro tubing to approximately 11.4 atmospheres to attain
a diameter of approximately 2.02 mm where it was maintained for
approximately 30 seconds. Balloon was then gently deflated and
retrieved proximally. A control arteriogram performed through the
Catalyst guide catheter demonstrated excellent apposition, with flow
through the treated left middle cerebral artery superior and
inferior divisions.

Slow flow was noted an anterior perisylvian branch of the superior
division which gradually improved with subsequent arteriograms at 15
and 30 minutes post stent deployment.

A final control arteriogram performed through the 5 French Catalyst
guide catheter now demonstrates significantly improved caliber and
flow.

Brisk flow in the anterior perisylvian branch of the superior
division, which correspond to the area of infarct seen on the MRI
examination of the brain.

The 5 French Catalyst guide catheter, and the Infinity 6 French
guide sheath were removed. The 8 French Pinnacle sheath in the right
groin was removed with the successful hemostasis with an 8 French
Angio-Seal closure device. Distal pulses remained Dopplerable in
both feet unchanged.

A CT of the brain performed at the end of the procedure demonstrated
no evidence of intracranial hemorrhage, mass effect or midline
shift.

Patient was then extubated without difficulty. Upon recovery, the
patient responded appropriately to simple instructions. She had mild
pronation drift in the right upper extremity.

However, speech was clear.

She was transferred to PACU and then neuro ICU.

The patient's overnight stay remained unremarkable. Her IV heparin
was stopped the following morning. The patient was switched to
aspirin 81 mg a day, and Brilinta 90 mg b.i.d.

Patient was able to handle liquids and solids without difficulty.
She was mobilized with PT and occupational therapy. Patient was
reminded to continue taking her aspirin and her Brilinta on a
regular basis and to maintain adequate hydration.

Patient was advised to refrain from stooping, bending or lifting
weights above 10 pounds for 2 weeks and not drive for a couple of
weeks. Patient was transferred to the admitting service.
IMPRESSION: Status post endovascular revascularization of symptomatic severe
high-grade stenosis of the left middle cerebral artery distally,
extending into the origin of the inferior division with stent
assisted angioplasty.

PLAN:
Follow-up in clinic 2-4 weeks post discharge.

## 2020-07-28 SURGERY — IR WITH ANESTHESIA
Anesthesia: General | Laterality: Left

## 2020-07-28 MED ORDER — SODIUM CHLORIDE 0.9 % IV SOLN: INTRAVENOUS | Status: DC

## 2020-07-28 MED ORDER — SUGAMMADEX SODIUM 200 MG/2ML IV SOLN
INTRAVENOUS | Status: DC | PRN
  Administered 2020-07-28: 400 mg via INTRAVENOUS

## 2020-07-28 MED ORDER — LIDOCAINE 2% (20 MG/ML) 5 ML SYRINGE
INTRAMUSCULAR | Status: DC | PRN
  Administered 2020-07-28: 60 mg via INTRAVENOUS

## 2020-07-28 MED ORDER — HEPARIN SODIUM (PORCINE) 1000 UNIT/ML IJ SOLN
INTRAMUSCULAR | Status: DC | PRN
  Administered 2020-07-28: 3000 [IU] via INTRAVENOUS

## 2020-07-28 MED ORDER — EPTIFIBATIDE 20 MG/10ML IV SOLN
INTRAVENOUS | Status: AC
  Filled 2020-07-28: qty 10

## 2020-07-28 MED ORDER — PHENYLEPHRINE 40 MCG/ML (10ML) SYRINGE FOR IV PUSH (FOR BLOOD PRESSURE SUPPORT)
PREFILLED_SYRINGE | INTRAVENOUS | Status: DC | PRN
  Administered 2020-07-28: 80 ug via INTRAVENOUS

## 2020-07-28 MED ORDER — NITROGLYCERIN 1 MG/10 ML FOR IR/CATH LAB
INTRA_ARTERIAL | Status: AC
  Filled 2020-07-28: qty 10

## 2020-07-28 MED ORDER — CHLORHEXIDINE GLUCONATE 0.12 % MT SOLN
OROMUCOSAL | Status: AC
  Administered 2020-07-28: 15 mL via OROMUCOSAL
  Filled 2020-07-28: qty 15

## 2020-07-28 MED ORDER — EPTIFIBATIDE 20 MG/10ML IV SOLN
INTRAVENOUS | Status: AC | PRN
  Administered 2020-07-28 (×3): 1.5 mg via INTRAVENOUS

## 2020-07-28 MED ORDER — FENTANYL CITRATE (PF) 250 MCG/5ML IJ SOLN
INTRAMUSCULAR | Status: DC | PRN
  Administered 2020-07-28: 100 ug via INTRAVENOUS

## 2020-07-28 MED ORDER — ACETAMINOPHEN 325 MG PO TABS: 650.0000 mg | ORAL_TABLET | ORAL | Status: DC | PRN

## 2020-07-28 MED ORDER — OXYCODONE HCL 5 MG/5ML PO SOLN: 5.0000 mg | Freq: Once | ORAL | Status: DC | PRN

## 2020-07-28 MED ORDER — IOHEXOL 300 MG/ML  SOLN: 150.0000 mL | Freq: Once | INTRAMUSCULAR | Status: DC | PRN

## 2020-07-28 MED ORDER — ACETAMINOPHEN 160 MG/5ML PO SOLN: 650.0000 mg | ORAL | Status: DC | PRN

## 2020-07-28 MED ORDER — TICAGRELOR 90 MG PO TABS: 90.0000 mg | ORAL_TABLET | Freq: Two times a day (BID) | ORAL | Status: DC

## 2020-07-28 MED ORDER — CLEVIDIPINE BUTYRATE 0.5 MG/ML IV EMUL
0.0000 mg/h | INTRAVENOUS | Status: AC
  Administered 2020-07-28: 21 mg/h via INTRAVENOUS
  Administered 2020-07-28: 29 mg/h via INTRAVENOUS
  Administered 2020-07-28: 30 mg/h via INTRAVENOUS
  Administered 2020-07-28 – 2020-07-29 (×3): 25 mg/h via INTRAVENOUS
  Administered 2020-07-29: 26 mg/h via INTRAVENOUS
  Administered 2020-07-29: 13 mg/h via INTRAVENOUS
  Administered 2020-07-29: 30 mg/h via INTRAVENOUS
  Filled 2020-07-28 (×4): qty 100
  Filled 2020-07-28 (×3): qty 200
  Filled 2020-07-28: qty 100
  Filled 2020-07-28: qty 50

## 2020-07-28 MED ORDER — OXYCODONE HCL 5 MG PO TABS: 5.0000 mg | ORAL_TABLET | Freq: Once | ORAL | Status: DC | PRN

## 2020-07-28 MED ORDER — ONDANSETRON HCL 4 MG/2ML IJ SOLN
INTRAMUSCULAR | Status: DC | PRN
  Administered 2020-07-28: 4 mg via INTRAVENOUS

## 2020-07-28 MED ORDER — VANCOMYCIN HCL 1000 MG/200ML IV SOLN
1000.0000 mg | Freq: Once | INTRAVENOUS | Status: AC
  Administered 2020-07-28: 1000 mg via INTRAVENOUS
  Filled 2020-07-28: qty 200

## 2020-07-28 MED ORDER — SODIUM CHLORIDE (PF) 0.9 % IJ SOLN
INTRAVENOUS | Status: AC | PRN
  Administered 2020-07-28 (×2): 25 ug via INTRA_ARTERIAL

## 2020-07-28 MED ORDER — SUCCINYLCHOLINE CHLORIDE 200 MG/10ML IV SOSY
PREFILLED_SYRINGE | INTRAVENOUS | Status: DC | PRN
  Administered 2020-07-28: 120 mg via INTRAVENOUS

## 2020-07-28 MED ORDER — IOHEXOL 300 MG/ML  SOLN
50.0000 mL | Freq: Once | INTRAMUSCULAR | Status: AC | PRN
  Administered 2020-07-28: 20 mL via INTRA_ARTERIAL

## 2020-07-28 MED ORDER — ESMOLOL HCL 100 MG/10ML IV SOLN
INTRAVENOUS | Status: DC | PRN
  Administered 2020-07-28 (×2): 50 mg via INTRAVENOUS

## 2020-07-28 MED ORDER — PROMETHAZINE HCL 25 MG/ML IJ SOLN: 6.2500 mg | INTRAMUSCULAR | Status: DC | PRN

## 2020-07-28 MED ORDER — PROTAMINE SULFATE 10 MG/ML IV SOLN
INTRAVENOUS | Status: DC | PRN
  Administered 2020-07-28: 5 mg via INTRAVENOUS

## 2020-07-28 MED ORDER — PROPOFOL 10 MG/ML IV BOLUS
INTRAVENOUS | Status: DC | PRN
  Administered 2020-07-28: 150 mg via INTRAVENOUS
  Administered 2020-07-28: 30 mg via INTRAVENOUS

## 2020-07-28 MED ORDER — CLEVIDIPINE BUTYRATE 0.5 MG/ML IV EMUL
INTRAVENOUS | Status: DC | PRN
  Administered 2020-07-28: 1 mg/h via INTRAVENOUS

## 2020-07-28 MED ORDER — HYDRALAZINE HCL 20 MG/ML IJ SOLN
INTRAMUSCULAR | Status: AC
  Administered 2020-07-28: 10 mg
  Filled 2020-07-28: qty 1

## 2020-07-28 MED ORDER — HEPARIN (PORCINE) 25000 UT/250ML-% IV SOLN
500.0000 [IU]/h | INTRAVENOUS | Status: DC
  Filled 2020-07-28 (×2): qty 250

## 2020-07-28 MED ORDER — CLEVIDIPINE BUTYRATE 0.5 MG/ML IV EMUL
INTRAVENOUS | Status: AC
  Administered 2020-07-29: 25 mg/h via INTRAVENOUS
  Filled 2020-07-28: qty 50

## 2020-07-28 MED ORDER — CHLORHEXIDINE GLUCONATE 0.12 % MT SOLN: 15.0000 mL | Freq: Once | OROMUCOSAL | Status: AC

## 2020-07-28 MED ORDER — ROCURONIUM BROMIDE 10 MG/ML (PF) SYRINGE
PREFILLED_SYRINGE | INTRAVENOUS | Status: DC | PRN
  Administered 2020-07-28: 100 mg via INTRAVENOUS

## 2020-07-28 MED ORDER — CHLORHEXIDINE GLUCONATE CLOTH 2 % EX PADS
6.0000 | MEDICATED_PAD | Freq: Every day | CUTANEOUS | Status: DC
  Administered 2020-07-28 – 2020-07-29 (×2): 6 via TOPICAL

## 2020-07-28 MED ORDER — ACETAMINOPHEN 650 MG RE SUPP: 650.0000 mg | RECTAL | Status: DC | PRN

## 2020-07-28 MED ORDER — TICAGRELOR 90 MG PO TABS
90.0000 mg | ORAL_TABLET | Freq: Two times a day (BID) | ORAL | Status: DC
  Administered 2020-07-28 – 2020-07-30 (×4): 90 mg via ORAL
  Filled 2020-07-28 (×4): qty 1

## 2020-07-28 MED ORDER — ASPIRIN 81 MG PO CHEW: 81.0000 mg | CHEWABLE_TABLET | Freq: Every day | ORAL | Status: DC

## 2020-07-28 MED ORDER — FENTANYL CITRATE (PF) 100 MCG/2ML IJ SOLN
INTRAMUSCULAR | Status: AC
  Administered 2020-07-28: 25 ug via INTRAVENOUS
  Filled 2020-07-28: qty 2

## 2020-07-28 MED ORDER — ORAL CARE MOUTH RINSE: 15.0000 mL | Freq: Once | OROMUCOSAL | Status: AC

## 2020-07-28 MED ORDER — IOHEXOL 300 MG/ML  SOLN
150.0000 mL | Freq: Once | INTRAMUSCULAR | Status: AC | PRN
  Administered 2020-07-28: 75 mL via INTRA_ARTERIAL

## 2020-07-28 MED ORDER — HYDRALAZINE HCL 20 MG/ML IJ SOLN: 10.0000 mg | INTRAMUSCULAR | Status: DC | PRN

## 2020-07-28 MED ORDER — LABETALOL HCL 5 MG/ML IV SOLN
INTRAVENOUS | Status: DC | PRN
  Administered 2020-07-28 (×2): 10 mg via INTRAVENOUS

## 2020-07-28 MED ORDER — FENTANYL CITRATE (PF) 100 MCG/2ML IJ SOLN
25.0000 ug | INTRAMUSCULAR | Status: DC | PRN
  Administered 2020-07-28: 25 ug via INTRAVENOUS

## 2020-07-28 MED ORDER — ENOXAPARIN SODIUM 40 MG/0.4ML ~~LOC~~ SOLN
40.0000 mg | SUBCUTANEOUS | Status: DC
  Administered 2020-07-29: 40 mg via SUBCUTANEOUS
  Filled 2020-07-28: qty 0.4

## 2020-07-28 MED ORDER — FENTANYL CITRATE (PF) 100 MCG/2ML IJ SOLN
INTRAMUSCULAR | Status: AC
  Administered 2020-07-28: 50 ug via INTRAVENOUS
  Filled 2020-07-28: qty 2

## 2020-07-28 MED ORDER — ASPIRIN 81 MG PO CHEW
81.0000 mg | CHEWABLE_TABLET | Freq: Every day | ORAL | Status: DC
  Administered 2020-07-29 – 2020-07-30 (×2): 81 mg via ORAL
  Filled 2020-07-28 (×2): qty 1

## 2020-07-28 MED ORDER — DEXAMETHASONE SODIUM PHOSPHATE 10 MG/ML IJ SOLN
INTRAMUSCULAR | Status: DC | PRN
  Administered 2020-07-28: 4 mg via INTRAVENOUS

## 2020-07-28 MED ORDER — LABETALOL HCL 5 MG/ML IV SOLN
INTRAVENOUS | Status: AC
  Filled 2020-07-28: qty 4

## 2020-07-28 MED ORDER — HEPARIN (PORCINE) 25000 UT/250ML-% IV SOLN
900.0000 [IU]/h | INTRAVENOUS | Status: DC
  Administered 2020-07-28: 500 [IU]/h via INTRAVENOUS
  Filled 2020-07-28: qty 250

## 2020-07-28 MED ORDER — PHENYLEPHRINE HCL-NACL 10-0.9 MG/250ML-% IV SOLN
INTRAVENOUS | Status: DC | PRN
  Administered 2020-07-28: 25 ug/min via INTRAVENOUS

## 2020-07-28 NOTE — Progress Notes (Signed)
HD#5 Subjective:   No acute events overnight.  During evaluation this morning, patient states she is feeling well. Feels her speech and strength are stable. She endorses anxiety regarding today's stent placement.   Objective:   Vital signs in last 24 hours: Vitals:   07/27/20 1532 07/27/20 1900 07/28/20 0040 07/28/20 0440  BP: (!) 154/84 (!) 168/85 (!) 151/82 (!) 144/70  Pulse: 81 87 89 83  Resp: (!) 24 19 20 18   Temp: 99.1 F (37.3 C) 97.8 F (36.6 C) 97.8 F (36.6 C) 97.9 F (36.6 C)  TempSrc: Axillary Oral Oral Oral  SpO2: 97% 100% 100% 100%  Weight:      Height:       Physical Exam Constitutional: well-appearing woman sitting up in bed, awake and alert, in no acute distress, appears anxious HENT: normocephalic atraumatic, mucous membranes moist Cardiovascular: regular rate and rhythm, no m/r/g Pulmonary/Chest: normal work of breathing on room air Neurological: alert & oriented to person, place, time, and situation. 5/5 strength in bilateral upper extremities.  Psych: anxious affect  Pertinent Labs: CBC Latest Ref Rng & Units 07/28/2020 07/27/2020 07/26/2020  WBC 4.0 - 10.5 K/uL 7.1 7.2 7.3  Hemoglobin 12.0 - 15.0 g/dL 9.4(L) 9.4(L) 9.3(L)  Hematocrit 36.0 - 46.0 % 29.1(L) 29.2(L) 28.1(L)  Platelets 150 - 400 K/uL 273 258 256    CMP Latest Ref Rng & Units 07/28/2020 07/27/2020 07/26/2020  Glucose 70 - 99 mg/dL 202(H) 168(H) 120(H)  BUN 6 - 20 mg/dL 12 11 12   Creatinine 0.44 - 1.00 mg/dL 1.03(H) 1.13(H) 1.17(H)  Sodium 135 - 145 mmol/L 141 140 141  Potassium 3.5 - 5.1 mmol/L 3.7 3.7 3.2(L)  Chloride 98 - 111 mmol/L 114(H) 116(H) 117(H)  CO2 22 - 32 mmol/L 20(L) 17(L) 17(L)  Calcium 8.9 - 10.3 mg/dL 8.8(L) 8.5(L) 8.2(L)  Total Protein 6.5 - 8.1 g/dL - - -  Total Bilirubin 0.3 - 1.2 mg/dL - - -  Alkaline Phos 38 - 126 U/L - - -  AST 15 - 41 U/L - - -  ALT 0 - 44 U/L - - -    Imaging: no new imaging  Assessment/Plan:   Active Problems:   Secondary  open-angle glaucoma of both eyes, severe stage   Hypertension   DM (diabetes mellitus), type 2, uncontrolled with complications (HCC)   Hyperlipidemia   Stroke ([place])   Mild concentric left ventricular hypertrophy (LVH)   Patient Summary:  Sharon Blankenship is a 55 y.o. 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  Acute CVA, multiple left MCA territory subacute ischemic infarctions 2/2 MCA territory stenosis Hx multiple left MCA territory embolic stroke (45/4098) 2/2 symptomatic MCA territory stenosis and hypertension Patient remains at her neurologic baseline. Plan for left MCA stenting today (4/25).  - Neurology following, appreciate their expertise - Dr. Estanislado Pandy to perform MCA stenting today (4/25)  - BP goal is systolic 119-147 given left M2 high-grade stenosis. Long-term BP goal normotensive after MCA stenting. - No OT follow-up, PT recommend HHPT - Continue DAPT (aspirin 81 mg daily and Brilinta 90 mg BID) - Will contact patient's glaucoma specialist again today to confirm glaucoma surgery can proceed as scheduled on 08/13/20  - Telemetry monitoring - Frequent neurochecks  Essential hypertension Patient's BP have been mostly 130s-150s/60s-90s on home losartan 100 mg daily only.  -  BP goal is systolic 867-672 given left M2 high-grade stenosis. Long-term BP goal normotensive after MCA stenting. - Continue home losartan 100 mg daily - Holding home Diamox 250 mg 3 times daily as above, okay to continue after MCA stenting  AKI, resolved Serum creatinine back to baseline which appears to be 1-1.2. Suspect pre-renal in setting of mild dehydration. Will continue to monitor. - encourage PO hydration - AM BMP  Type 2 diabetes A1c 7.7.  Current regimen of Tresiba 76 units daily, Ozempic 1  mg daily, metformin 1000 mg twice daily.  Fiasp 18 units daily as needed.  Continue gabapentin 100 mg 3 times daily for nerve pain. -SSI, Lantus 50 units daily during admission  Hyperlipidemia continue home atorvastatin 20 mg daily.   Glaucoma History of glaucoma, on acetazolamide 250 mg 3 times daily. Holding as above. - Continue Xalatan solution both eyes daily, Cosopt 1 drop right eye twice daily, Alphagan 1 drop both eyes 3 times daily - Resume Diamox after MCA stenting - Patient is scheduled for glaucoma surgery at [facility] on 08/13/20. Will reach out to her ophthalmologist today to confirm surgery can proceed as scheduled.   Diet: Heart Healthy IVF: none VTE: Enoxaparin Code: Full PT/OT recs: HHPT (would have to be [facility] HHPT per TOC), no OT follow-up needed    Please contact the on call pager after 5 pm and on weekends at [phone].  Alexandria Lodge, MD PGY-1 Internal Medicine Teaching Service Pager: [phone] 07/28/2020

## 2020-07-28 NOTE — Anesthesia Procedure Notes (Signed)
Central Venous Catheter Insertion Performed by: Albertha Ghee, MD, anesthesiologist Start/End4/25/2022 3:20 PM, 07/28/2020 3:33 PM Patient location: Pre-op. Preanesthetic checklist: patient identified, IV checked, site marked, risks and benefits discussed, surgical consent, monitors and equipment checked, pre-op evaluation, timeout performed and anesthesia consent Position: Trendelenburg Lidocaine 1% used for infiltration and patient sedated Hand hygiene performed , maximum sterile barriers used  and Seldinger technique used Catheter size: 7 Fr Central line was placed.Double lumen Procedure performed using ultrasound guided technique. Ultrasound Notes:anatomy identified, needle tip was noted to be adjacent to the nerve/plexus identified, no ultrasound evidence of intravascular and/or intraneural injection and image(s) printed for medical record Attempts: 1 Following insertion, line sutured, dressing applied and Biopatch. Post procedure assessment: blood return through all ports, free fluid flow and no air  Patient tolerated the procedure well with no immediate complications.

## 2020-07-28 NOTE — Anesthesia Procedure Notes (Signed)
Arterial Line Insertion Start/End4/25/2022 10:00 AM, 07/28/2020 10:00 AM Performed by: CRNA  Patient location: Pre-op. Preanesthetic checklist: patient identified, IV checked, site marked, risks and benefits discussed, surgical consent, monitors and equipment checked, pre-op evaluation, timeout performed and anesthesia consent Lidocaine 1% used for infiltration Left, radial was placed Catheter size: 20 G Hand hygiene performed , maximum sterile barriers used  and Seldinger technique used Allen's test indicative of satisfactory collateral circulation Attempts: 1 Procedure performed without using ultrasound guided technique. Following insertion, dressing applied and Biopatch. Post procedure assessment: normal  Patient tolerated the procedure well with no immediate complications.

## 2020-07-28 NOTE — Plan of Care (Signed)
  Problem: Education: Goal: Knowledge of disease or condition will improve Outcome: Progressing Goal: Knowledge of secondary prevention will improve Outcome: Progressing Goal: Knowledge of patient specific risk factors addressed and post discharge goals established will improve Outcome: Progressing Goal: Individualized Educational Video(s) Outcome: Progressing   Problem: Ischemic Stroke/TIA Tissue Perfusion: Goal: Complications of ischemic stroke/TIA will be minimized Outcome: Progressing   

## 2020-07-28 NOTE — Procedures (Signed)
S/P Lt Common carotid arteriogram followed by stent assisted angioplasty of of Lt MCA.  Post CT brain No ICH or mass effect. 17F angioseal for hemostasis at RT CFA site. Distal pulses all dopplerable. Extubated.  Follows simple instructions appropriately. Denies any  H/As,N/V or vision changes. Speech clear.  Oriented to place year and month. Moves all 4s nearly equally. S.Raziah Funnell MD

## 2020-07-28 NOTE — Sedation Documentation (Signed)
Procedure started. Anesthesia case 

## 2020-07-28 NOTE — Anesthesia Preprocedure Evaluation (Addendum)
Anesthesia Evaluation  Patient identified by MRN, date of birth, ID band Patient awake    Reviewed: Allergy & Precautions, NPO status , Patient's Chart, lab work & pertinent test results  History of Anesthesia Complications Negative for: history of anesthetic complications  Airway Mallampati: II  TM Distance: >3 FB Neck ROM: Full    Dental  (+) Dental Advisory Given, Chipped   Pulmonary neg pulmonary ROS,    Pulmonary exam normal        Cardiovascular hypertension, Pt. on medications Normal cardiovascular exam   '22 TTE - EF 55 to 60%. Mild left ventricular hypertrophy. Mild to moderate aortic valve sclerosis/calcification is present, without any evidence of aortic stenosis.     Neuro/Psych CVA, No Residual Symptoms negative psych ROS   GI/Hepatic Neg liver ROS, GERD  Medicated and Controlled,  Endo/Other  diabetes, Type 2, Oral Hypoglycemic Agents, Insulin Dependent Obesity   Renal/GU negative Renal ROS     Musculoskeletal negative musculoskeletal ROS (+)   Abdominal   Peds  Hematology  (+) anemia ,   Anesthesia Other Findings Open-angle glaucoma Covid test negative   Reproductive/Obstetrics                            Anesthesia Physical Anesthesia Plan  ASA: III  Anesthesia Plan: General   Post-op Pain Management:    Induction: Intravenous  PONV Risk Score and Plan: 3 and Treatment may vary due to age or medical condition, Ondansetron, Dexamethasone and Midazolam  Airway Management Planned: Oral ETT  Additional Equipment: Arterial line  Intra-op Plan:   Post-operative Plan: Extubation in OR  Informed Consent: I have reviewed the patients History and Physical, chart, labs and discussed the procedure including the risks, benefits and alternatives for the proposed anesthesia with the patient or authorized representative who has indicated his/her understanding and  acceptance.     Dental advisory given  Plan Discussed with: CRNA, Anesthesiologist and Surgeon  Anesthesia Plan Comments:        Anesthesia Quick Evaluation

## 2020-07-28 NOTE — Progress Notes (Signed)
STROKE TEAM PROGRESS NOTE   SUBJECTIVE (INTERVAL HISTORY) She had left middle cerebral artery stent assisted angioplasty by Dr. Estanislado Pandy earlier today which went uneventful. Her sister at bedside, visiting.  No complaints.  Patient is alert and oriented.  All questions answered     OBJECTIVE Temp:  [97.8 F (36.6 C)-98.4 F (36.9 C)] 97.8 F (36.6 C) (04/25 1620) Pulse Rate:  [83-106] 106 (04/25 1700) Cardiac Rhythm: Normal sinus rhythm;Sinus tachycardia (04/25 1700) Resp:  [15-20] 19 (04/25 1700) BP: (114-168)/(68-85) 135/68 (04/25 1700) SpO2:  [99 %-100 %] 100 % (04/25 1700) Arterial Line BP: (180-188)/(57-65) 180/57 (04/25 1700)  Recent Labs  Lab 07/27/20 1232 07/27/20 1629 07/27/20 2126 07/28/20 0745 07/28/20 1309  GLUCAP 238* 178* 177* 175* 190*   Recent Labs  Lab 07/23/20 1155 07/23/20 1208 07/25/20 0530 07/26/20 0351 07/27/20 0211 07/28/20 0310  NA 139 142 142 141 140 141  K 3.8 3.9 3.8 3.2* 3.7 3.7  CL 115* 116* 115* 117* 116* 114*  CO2 13*  --  18* 17* 17* 20*  GLUCOSE 127* 122* 135* 120* 168* 202*  BUN 17 18 16 12 11 12   CREATININE 1.14* 1.00 1.49* 1.17* 1.13* 1.03*  CALCIUM 8.6*  --  8.9 8.2* 8.5* 8.8*   Recent Labs  Lab 07/23/20 1155  AST 18  ALT 15  ALKPHOS 124  BILITOT 0.4  PROT 7.0  ALBUMIN 3.5   Recent Labs  Lab 07/23/20 1155 07/23/20 1208 07/25/20 0530 07/26/20 0351 07/27/20 0211 07/28/20 0310  WBC 8.5  --  6.0 7.3 7.2 7.1  NEUTROABS 6.1  --  4.0 4.6 4.1 4.0  HGB 10.4* 10.5* 9.9* 9.3* 9.4* 9.4*  HCT 33.4* 31.0* 30.4* 28.1* 29.2* 29.1*  MCV 85.0  --  81.3 80.1 81.6 81.3  PLT 286  --  256 256 258 273   No results for input(s): CKTOTAL, CKMB, CKMBINDEX, TROPONINI in the last 168 hours. Recent Labs    07/28/20 0310  LABPROT 14.3  INR 1.1   No results for input(s): COLORURINE, LABSPEC, PHURINE, GLUCOSEU, HGBUR, BILIRUBINUR, KETONESUR, PROTEINUR, UROBILINOGEN, NITRITE, LEUKOCYTESUR in the last 72 hours.  Invalid input(s):  APPERANCEUR     Component Value Date/Time   CHOL 108 07/24/2020 0155   CHOL 133 04/24/2020 1501   TRIG 113 07/24/2020 0155   HDL 24 (L) 07/24/2020 0155   HDL 39 (L) 04/24/2020 1501   CHOLHDL 4.5 07/24/2020 0155   VLDL 23 07/24/2020 0155   LDLCALC 61 07/24/2020 0155   LDLCALC 74 04/24/2020 1501   Lab Results  Component Value Date   HGBA1C 7.7 (H) 07/24/2020      Component Value Date/Time   LABOPIA NONE DETECTED 07/23/2020 1444   COCAINSCRNUR NONE DETECTED 07/23/2020 1444   LABBENZ NONE DETECTED 07/23/2020 1444   AMPHETMU NONE DETECTED 07/23/2020 1444   THCU NONE DETECTED 07/23/2020 1444   LABBARB NONE DETECTED 07/23/2020 1444    No results for input(s): ETH in the last 168 hours.   MR ANGIO HEAD WO CONTRAST  Result Date: 07/24/2020 CLINICAL DATA:  Difficulty speaking EXAM: MRA HEAD WITHOUT CONTRAST TECHNIQUE: Angiographic images of the Circle of Willis were obtained using MRA technique without intravenous contrast. COMPARISON:  Brain MRI 07/23/2020 FINDINGS: POSTERIOR CIRCULATION: --Vertebral arteries: Normal --Inferior cerebellar arteries: Normal. --Basilar artery: Normal. --Superior cerebellar arteries: Normal. --Posterior cerebral arteries: Normal. ANTERIOR CIRCULATION: --Intracranial internal carotid arteries: Normal. --Anterior cerebral arteries (ACA): Normal. --Middle cerebral arteries (MCA): There is short segment occlusion of the left MCA proximal M2 segments  superior division. Moderate stenosis of the superior division of the right MCA proximal M2 segment. ANATOMIC VARIANTS: None IMPRESSION: 1. Short segment occlusion of the left MCA proximal M2 segment superior division. 2. Moderate stenosis of the superior division of the right MCA proximal M2 segment. 3. These results were called by telephone at the time of interpretation on 07/24/2020 at 1:03 am to provider [facility] , who verbally acknowledged these results. These results were communicated to Dr. Kerney Elbe at  1:03 am on 07/24/2020 by text page via the [facility] messaging system. Electronically Signed   By: Ulyses Jarred M.D.   On: 07/24/2020 01:04   MR BRAIN WO CONTRAST  Result Date: 07/23/2020 CLINICAL DATA:  Neurological deficit.  Acute stroke suspected. EXAM: MRI HEAD WITHOUT CONTRAST TECHNIQUE: Multiplanar, multiecho pulse sequences of the brain and surrounding structures were obtained without intravenous contrast. COMPARISON:  CT 03/22/2020.  MRI 03/21/2020. FINDINGS: Brain: There are several acute infarctions within the left hemisphere extending from the posterior frontal cortical and subcortical brain to the parieto-occipital cortical and subcortical brain. Findings could be watershed or embolic. Mild swelling of the regions of acute infarction. Petechial blood products in the left parieto-occipital region infarctions. Minimal petechial blood products in the largest region of posterior frontal infarction. No focal abnormality affects the brainstem or cerebellum. Right cerebral hemisphere shows a few old small vessel infarctions. Left cerebral hemisphere shows old watershed infarctions which were acute in December of 2021. No hydrocephalus or extra-axial collection. Vascular: Major vessels at the base of the brain show flow. Skull and upper cervical spine: Negative Sinuses/Orbits: Clear/normal Other: None IMPRESSION: 1. Several acute infarctions in the left hemisphere extending from the posterior frontal cortical and subcortical brain to the parieto-occipital cortical and subcortical brain. Findings could be watershed or embolic. Petechial blood products in the largest region of posterior frontal infarction and left parieto-occipital infarction. 2. 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 Electronically Signed   By: Nelson Chimes M.D.   On: 07/23/2020 17:28   DG CHEST PORT 1 VIEW  Result Date: 07/28/2020 CLINICAL DATA:  New right IJ CVC EXAM: PORTABLE CHEST 1 VIEW COMPARISON:  March 21, 2020. FINDINGS: Right  internal jugular central venous catheter with tip overlying the SVC. The heart size and mediastinal contours are within normal limits. No focal consolidation. No pleural effusion. No visible pneumothorax. Elevation the right hemidiaphragm. The visualized skeletal structures are unremarkable. IMPRESSION: Right internal jugular central venous catheter with tip overlying the SVC. No visible pneumothorax. Electronically Signed   By: Dahlia Bailiff MD   On: 07/28/2020 15:55   VAS [place] ABI WITH/WO TBI  Result Date: 07/24/2020 LOWER EXTREMITY DOPPLER STUDY Indications: LLE "cool" - BLE warm to touch upon examination. High Risk Factors: Hypertension, hyperlipidemia, Diabetes, no history of                    smoking, prior CVA.  Comparison Study: No previous exam Performing Technologist: Hill, Jody RVT, RDMS  Examination Guidelines: A complete evaluation includes at minimum, Doppler waveform signals and systolic blood pressure reading at the level of bilateral brachial, anterior tibial, and posterior tibial arteries, when vessel segments are accessible. Bilateral testing is considered an integral part of a complete examination. Photoelectric Plethysmograph (PPG) waveforms and toe systolic pressure readings are included as required and additional duplex testing as needed. Limited examinations for reoccurring indications may be performed as noted.  ABI Findings: +---------+------------------+-----+---------+---------------+ Right    Rt Pressure (mmHg)IndexWaveform Comment         +---------+------------------+-----+---------+---------------+  Brachial 145                    triphasic                +---------+------------------+-----+---------+---------------+ PTA                             biphasic noncompressible +---------+------------------+-----+---------+---------------+ DP       232               1.60 triphasic                +---------+------------------+-----+---------+---------------+  Marchelle Gearing               1.06 Normal                   +---------+------------------+-----+---------+---------------+ +---------+------------------+-----+---------+---------------------------------+ Left     Lt Pressure (mmHg)IndexWaveform Comment                           +---------+------------------+-----+---------+---------------------------------+ Brachial 143                    biphasic                                   +---------+------------------+-----+---------+---------------------------------+ PTA                             triphasicnoncompressible                   +---------+------------------+-----+---------+---------------------------------+ DP       226               1.56 triphasic                                  +---------+------------------+-----+---------+---------------------------------+ Great Toe101               0.70 Normal   borderline normal to mild small                                            vessel disease                    +---------+------------------+-----+---------+---------------------------------+  Summary: Right: Resting right ankle-brachial index indicates noncompressible right lower extremity arteries. The right toe-brachial index is normal. Left: Resting left ankle-brachial index indicates noncompressible left lower extremity arteries. The left toe-brachial index is normal.  *See table(s) above for measurements and observations.  Electronically signed by Harold Barban MD on 07/24/2020 at 9:28:50 PM.   Final    ECHOCARDIOGRAM COMPLETE  Result Date: 07/24/2020    ECHOCARDIOGRAM REPORT   Patient Name:   TORII RAKESTRAW Date of Exam: 07/24/2020 Medical Rec #:  [id number]     Height:       67.0 in Accession #:    [id number]    Weight:       250.0 lb Date of Birth:  Jan 21, 1966     BSA:          2.223 m Patient Age:    56 years      BP:  158/89 mmHg Patient Gender: F             HR:           93 bpm. Exam Location:  Inpatient  Procedure: 2D Echo, Cardiac Doppler and Color Doppler Indications:    CVA  History:        Patient has prior history of Echocardiogram examinations, most                 recent 03/23/2020. Stroke; Risk Factors:Hypertension and                 Diabetes.  Sonographer:    Luisa Hart RDCS Referring Phys: [id number] Courtney Paris  Sonographer Comments: Patient is morbidly obese. No cardiac surgeries noted on chart IMPRESSIONS  1. Abnormal septal motion . Left ventricular ejection fraction, by estimation, is 55 to 60%. The left ventricle has normal function. The left ventricle has no regional wall motion abnormalities. There is mild left ventricular hypertrophy. Left ventricular diastolic parameters were normal.  2. Right ventricular systolic function is normal. The right ventricular size is normal. There is normal pulmonary artery systolic pressure.  3. The mitral valve is abnormal. No evidence of mitral valve regurgitation. No evidence of mitral stenosis.  4. The aortic valve is tricuspid. Aortic valve regurgitation is not visualized. Mild to moderate aortic valve sclerosis/calcification is present, without any evidence of aortic stenosis.  5. The inferior vena cava is normal in size with greater than 50% respiratory variability, suggesting right atrial pressure of 3 mmHg. FINDINGS  Left Ventricle: Abnormal septal motion. Left ventricular ejection fraction, by estimation, is 55 to 60%. The left ventricle has normal function. The left ventricle has no regional wall motion abnormalities. The left ventricular internal cavity size was normal in size. There is mild left ventricular hypertrophy. Left ventricular diastolic parameters were normal. Right Ventricle: The right ventricular size is normal. No increase in right ventricular wall thickness. Right ventricular systolic function is normal. There is normal pulmonary artery systolic pressure. The tricuspid regurgitant velocity is 2.48 m/s, and  with an assumed right  atrial pressure of 3 mmHg, the estimated right ventricular systolic pressure is 0000000 mmHg. Left Atrium: Left atrial size was normal in size. Right Atrium: Right atrial size was normal in size. Pericardium: There is no evidence of pericardial effusion. Mitral Valve: The mitral valve is abnormal. There is moderate thickening of the mitral valve leaflet(s). There is moderate calcification of the mitral valve leaflet(s). Mild mitral annular calcification. No evidence of mitral valve regurgitation. No evidence of mitral valve stenosis. Tricuspid Valve: The tricuspid valve is normal in structure. Tricuspid valve regurgitation is trivial. No evidence of tricuspid stenosis. Aortic Valve: The aortic valve is tricuspid. Aortic valve regurgitation is not visualized. Mild to moderate aortic valve sclerosis/calcification is present, without any evidence of aortic stenosis. Aortic valve mean gradient measures 4.0 mmHg. Aortic valve peak gradient measures 8.9 mmHg. Pulmonic Valve: The pulmonic valve was normal in structure. Pulmonic valve regurgitation is trivial. No evidence of pulmonic stenosis. Aorta: The aortic root is normal in size and structure. Venous: The inferior vena cava is normal in size with greater than 50% respiratory variability, suggesting right atrial pressure of 3 mmHg. IAS/Shunts: The interatrial septum was not well visualized.  LEFT VENTRICLE PLAX 2D LVIDd:         3.20 cm     Diastology LVIDs:         2.00 cm     LV e' medial:  5.66 cm/s LV PW:         1.40 cm     LV E/e' medial:  13.9 LV IVS:        1.25 cm     LV e' lateral:   7.72 cm/s                            LV E/e' lateral: 10.2  LV Volumes (MOD) LV vol d, MOD A2C: 51.1 ml LV vol d, MOD A4C: 33.4 ml LV vol s, MOD A2C: 27.4 ml LV vol s, MOD A4C: 15.2 ml LV SV MOD A2C:     23.7 ml LV SV MOD A4C:     33.4 ml LV SV MOD BP:      22.0 ml RIGHT VENTRICLE RV S prime:     11.10 cm/s TAPSE (M-mode): 1.6 cm LEFT ATRIUM             Index       RIGHT ATRIUM           Index LA diam:        2.90 cm 1.30 cm/m  RA Area:     7.14 cm LA Vol (A2C):   25.9 ml 11.65 ml/m RA Volume:   10.20 ml 4.59 ml/m LA Vol (A4C):   32.8 ml 14.75 ml/m LA Biplane Vol: 29.5 ml 13.27 ml/m  AORTIC VALVE                   PULMONIC VALVE AV Vmax:           149.00 cm/s PV Vmax:       0.87 m/s AV Vmean:          95.800 cm/s PV Vmean:      71.200 cm/s AV VTI:            0.238 m     PV VTI:        0.199 m AV Peak Grad:      8.9 mmHg    PV Peak grad:  3.0 mmHg AV Mean Grad:      4.0 mmHg    PV Mean grad:  2.0 mmHg LVOT Vmax:         93.30 cm/s LVOT Vmean:        55.700 cm/s LVOT VTI:          0.147 m LVOT/AV VTI ratio: 0.62  AORTA Ao Root diam: 3.40 cm Ao Asc diam:  3.20 cm MITRAL VALVE                TRICUSPID VALVE MV Area (PHT): 3.83 cm     TR Peak grad:   24.6 mmHg MV Decel Time: 198 msec     TR Vmax:        248.00 cm/s MV E velocity: 78.80 cm/s MV A velocity: 108.00 cm/s  SHUNTS MV E/A ratio:  0.73         Systemic VTI: 0.15 m Jenkins Rouge MD Electronically signed by Jenkins Rouge MD Signature Date/Time: 07/24/2020/11:57:51 AM    Final    VAS US CAROTID (at [facility] and [facility] only)  Result Date: 07/24/2020 Carotid Arterial Duplex Study Indications:       CVA and Speech disturbance. Risk Factors:      Hypertension, hyperlipidemia, Diabetes, no history of                    smoking, prior CVA. Limitations  Today's exam was limited due to the body habitus of the                    patient. Comparison Study:  No previous exams. CTA performed 03/22/20. Performing Technologist: Rogelia Rohrer  Examination Guidelines: A complete evaluation includes B-mode imaging, spectral Doppler, color Doppler, and power Doppler as needed of all accessible portions of each vessel. Bilateral testing is considered an integral part of a complete examination. Limited examinations for reoccurring indications may be performed as noted.  Right Carotid Findings:  +----------+--------+--------+--------+------------------+------------------+           PSV cm/sEDV cm/sStenosisPlaque DescriptionComments           +----------+--------+--------+--------+------------------+------------------+ CCA Prox  91      18                                                   +----------+--------+--------+--------+------------------+------------------+ CCA Distal62      17                                intimal thickening +----------+--------+--------+--------+------------------+------------------+ ICA Prox  80      30                                                   +----------+--------+--------+--------+------------------+------------------+ ICA Distal57      22                                                   +----------+--------+--------+--------+------------------+------------------+ ECA       92      14                                                   +----------+--------+--------+--------+------------------+------------------+ +----------+--------+-------+----------------+-------------------+           PSV cm/sEDV cmsDescribe        Arm Pressure (mmHG) +----------+--------+-------+----------------+-------------------+ [id number]             Multiphasic, WNL                    +----------+--------+-------+----------------+-------------------+ +---------+--------+--+--------+--+---------+ VertebralPSV cm/s41EDV cm/s12Antegrade +---------+--------+--+--------+--+---------+  Left Carotid Findings: +----------+--------+--------+--------+------------------+------------------+           PSV cm/sEDV cm/sStenosisPlaque DescriptionComments           +----------+--------+--------+--------+------------------+------------------+ CCA Prox  77      14                                intimal thickening +----------+--------+--------+--------+------------------+------------------+ CCA Distal68      14                                 intimal thickening +----------+--------+--------+--------+------------------+------------------+ ICA Prox  70  27                                                   +----------+--------+--------+--------+------------------+------------------+ ICA Distal81      33                                                   +----------+--------+--------+--------+------------------+------------------+ ECA       76      11                                                   +----------+--------+--------+--------+------------------+------------------+ +----------+--------+--------+----------------+-------------------+           PSV cm/sEDV cm/sDescribe        Arm Pressure (mmHG) +----------+--------+--------+----------------+-------------------+ [id number]              Multiphasic, WNL                    +----------+--------+--------+----------------+-------------------+ +---------+--------+--+--------+--+---------+ VertebralPSV cm/s58EDV cm/s14Antegrade +---------+--------+--+--------+--+---------+   Summary: Right Carotid: The extracranial vessels were near-normal with only minimal wall                thickening or plaque. Left Carotid: The extracranial vessels were near-normal with only minimal wall               thickening or plaque. Vertebrals:  Bilateral vertebral arteries demonstrate antegrade flow. Subclavians: Normal flow hemodynamics were seen in bilateral subclavian              arteries. *See table(s) above for measurements and observations.  Electronically signed by Harold Barban MD on 07/24/2020 at 9:28:20 PM.    Final     PHYSICAL EXAM  Temp:  [97.8 F (36.6 C)-98.4 F (36.9 C)] 97.8 F (36.6 C) (04/25 1620) Pulse Rate:  [83-106] 106 (04/25 1700) Resp:  [15-20] 19 (04/25 1700) BP: (114-168)/(68-85) 135/68 (04/25 1700) SpO2:  [99 %-100 %] 100 % (04/25 1700) Arterial Line BP: (180-188)/(57-65) 180/57 (04/25 1700)  General -morbidly obese middle-aged  African-American lady,  , in no apparent distress. Cardiovascular - Regular rhythm and rate.  Mental Status -  Level of arousal and orientation to time, place, and person were intact. Language including expression, naming, repetition, comprehension was assessed and found intact.  Cranial Nerves II - XII - II - Visual field intact OU. III, IV, VI - Extraocular movements intact. V - Facial sensation intact bilaterally. VII - Facial movement intact bilaterally. VIII - Hearing & vestibular intact bilaterally. X - Palate elevates symmetrically. XI - Chin turning & shoulder shrug intact bilaterally. XII - Tongue protrusion intact.  Motor Strength - The patient's strength was normal in all extremities and pronator drift was absent.  Bulk was normal and fasciculations were absent.   Motor Tone - normal   Sensory - Light touch, temperature/pinprick were assessed and were symmetrical.    Coordination - The patient had normal movements in the hands with no ataxia or dysmetria.  Tremor was absent.  Gait and Station - deferred.   ASSESSMENT/PLAN Sharon Blankenship is a 55 y.o. 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 No tPA given due to outside window.    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  LDL 61  HgbA1c 7.7  P2Y12 25  Lovenox for VTE prophylaxis  aspirin 81 mg daily prior to admission, now on aspirin 81 mg daily and Brilinta (ticagrelor) 90 mg bid.  Patient counseled to be compliant with her antithrombotic medications  Ongoing aggressive stroke risk factor management  Therapy recommendations: OT:  no followup needs; Home health PT  Disposition: Pending  History of stroke  03/2020 admitted for right upper extremity numbness weakness, slurred speech and difficulty walking.  CT left frontal infarct.  MRI showed left MCA territory, CR, MCA/ACA and MCA/PCA watershed area infarcts.  MRI showed severe left M2 stenosis.  CTA head and neck left ICA atherosclerosis with plaque, left M2 severe stenosis.  LDL 159, A1c 6.6.  EF 55 to 60%.  Discharged on DAPT and Lipitor.  Open angle glaucoma  Was on Diamox 250 TID  plan for eye surgery 08/12/2020  Diamox (although IV more than po) can cause " reverse Robinhood phenomena" that can worsen left MCA perfusion, current off meds, recommend to hold if OK with ophthalmology until MCA stenting done  Diamox was currently on hold, okay to continue after MCA stenting  Diabetes  HgbA1c 7.7 goal < 7.0  Uncontrolled  Currently on Lantus  CBG monitoring  SSI  DM education and close PCP follow up  Hypertension . Stable . On Cozaar 100mg  daily . Avoid low BP  Long term BP goal 130-150 given left M2 high-grade stenosis.  Long-term BP goal normotensive after MCA stenting.  Hyperlipidemia  Home meds: Lipitor 20  LDL 61, goal < 70  Now on Lipitor 40  Continue statin at discharge  Other Stroke Risk Factors  Obesity, Body mass index is 39.16 kg/m.    Hospital day # 5 I have personally obtained history,examined this patient, reviewed notes, independently viewed imaging studies, participated in medical decision making and plan of care.ROS completed by me personally and pertinent positives fully documented  I have made any additions or clarifications directly to the above note. Agree with note above.  Patient had left middle cerebral artery angioplasty stenting done today and is doing well.  Continue IV heparin drip overnight and discontinue in the morning.  Continue dual antiplatelet therapy for 3 to 6 months.  Strict blood pressure control  with systolic 892-119 for first 24 hours postprocedure.  Bedrest as per post intervention protocol.  Long discussion with patient and sister and answered questions. This patient is critically ill and at significant risk of neurological worsening, death and care requires constant monitoring of vital signs, hemodynamics,respiratory and cardiac monitoring, extensive review of multiple databases, frequent neurological assessment, discussion with family, other specialists and medical decision making of high complexity.I have made any additions or clarifications directly to the above note.This critical care time does not reflect procedure time, or teaching time or supervisory time of PA/NP/Med Resident etc but could involve care discussion time.  I spent 30 minutes of neurocritical care time  in the care of  this patient.    Sharon Kiehn  Leonie Blankenship, [place] Pager: [phone] 07/28/2020 6:08 PM       To contact Stroke Continuity provider, please refer to [email or web address] After hours, contact General Neurology

## 2020-07-28 NOTE — Progress Notes (Signed)
Patient assessed in PACU post-procedure. She is neurologically intact with the exception of a slight right arm drift. She was able to answer all questions appropriately and follow all commands. Right groin vascular site is soft and non-tender and the dressing is clean and dry. She now has a Right IJ central line due to poor venous access and IV infiltration on the left upper arm. Left a-line in place. Right radial attempted vascular access site is clean and dry. Patient awaiting transfer to her ICU bed.   NIR will continue to follow. Please call NIR with any questions.   Sharon Blankenship, [place] [phone] 07/28/2020, 3:45 PM

## 2020-07-28 NOTE — H&P (Signed)
Chief Complaint: Patient was seen in consultation today for cerebral arteriogram with intervention; left MCA stenosis   Referring Physician(s): Dr. Erlinda Hong  Supervising Physician: Luanne Bras  Patient Status: [facility] - [facility]  History of Present Illness: Sharon Blankenship is a 55 y.o. female with a medical history significant for DM, HTN, glaucoma and a stroke December 2021. She presented to the [facility] ED 07/23/20 with complaints of difficulty speaking and intermittent headaches. MR imaging showed a left MCA occlusion and on 07/25/20 she underwent a diagnostic cerebral arteriogram for further evaluation. 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  Dr. Estanislado Pandy presented the patient with the findings and after a discussion about possible treatment options the patient elected to proceed with a cerebral arteriogram with intervention/stenting of the left MCA.    Past Medical History:  Diagnosis Date  . CVA (cerebral vascular accident) ([place])   . Diabetes mellitus without complication ([place])   . Hypertension   . Open-angle glaucoma     Past Surgical History:  Procedure Laterality Date  . ABDOMINAL HYSTERECTOMY     2016    Allergies: Aloe and Penicillins  Medications: Prior to Admission medications   Medication Sig Start Date End Date Taking? Authorizing Provider  acetaZOLAMIDE (DIAMOX) 125 MG tablet Take 2 tablets (250 mg total) by mouth in the morning, at noon, in the evening, and at bedtime. Patient taking differently: Take 125 mg by mouth in the morning, at noon, in the evening, and at bedtime. 06/06/20 10/04/20 Yes Christian, Rylee, MD  aspirin 81 MG EC tablet Take 1 tablet (81 mg total) by mouth daily. Swallow whole. Patient taking differently: Take 81 mg by mouth daily. 06/06/20  Yes Christian, Rylee, MD  atorvastatin (LIPITOR) 20 MG tablet Take 1 tablet (20 mg total) by mouth daily. 06/06/20  Yes Christian, Rylee, MD  azelastine (ASTELIN) 0.1 %  nasal spray Place 1 spray into both nostrils 2 (two) times daily as needed for allergies. 07/02/20  Yes [provider]  brimonidine (ALPHAGAN) 0.2 % ophthalmic solution Place 1 drop into both eyes 3 (three) times daily. 02/18/20  Yes [provider]  dorzolamide-timolol (COSOPT) 22.3-6.8 MG/ML ophthalmic solution Place 1 drop into the right eye 2 (two) times daily. 02/10/20  Yes [provider]  ferrous sulfate 325 (65 FE) MG tablet Take 325 mg by mouth daily.   Yes [provider]  FIASP FLEXTOUCH 100 UNIT/ML FlexTouch Pen Inject 18 Units into the skin daily as needed (high blood sugar). 03/21/20  Yes [provider]  gabapentin (NEURONTIN) 100 MG capsule Take 1 capsule (100 mg total) by mouth 3 (three) times daily as needed (nerve pain). 06/06/20  Yes Christian, Rylee, MD  latanoprost (XALATAN) 0.005 % ophthalmic solution Place 1 drop into both eyes at bedtime. 02/12/20  Yes [provider]  loratadine (CLARITIN) 10 MG tablet Take 10 mg by mouth daily as needed for allergies. 01/17/20  Yes [provider]  losartan (COZAAR) 100 MG tablet Take 1 tablet (100 mg total) by mouth daily. 06/06/20 06/06/21 Yes Christian, Rylee, MD  meloxicam (MOBIC) 15 MG tablet Take 15 mg by mouth daily as needed for pain. 01/01/20  Yes [provider]  metFORMIN (GLUCOPHAGE) 1000 MG tablet Take 1 tablet (1,000 mg total) by mouth 2 (two) times daily with a meal. 06/06/20  Yes Christian, Rylee, MD  methocarbamol (ROBAXIN) 500 MG tablet Take 500 mg by mouth 3 (three) times daily as needed for muscle spasms. 03/04/20  Yes [provider]  montelukast (SINGULAIR) 10 MG tablet TAKE 1 TABLET (10 MG TOTAL) BY MOUTH DAILY. Patient taking differently: Take 10 mg by mouth at bedtime. 04/24/20 04/24/21 Yes [name], MD  Multiple Vitamin (MULTIVITAMIN PO) Take 1 tablet by mouth daily.   Yes [provider]  OZEMPIC, 1 MG/DOSE, 4 MG/3ML SOPN Inject 1 mg  into the skin once a week. On Wednesday 06/06/20  Yes Christian, Rylee, MD  pantoprazole (PROTONIX) 40 MG tablet Take 1 tablet (40 mg total) by mouth daily. 07/15/20 07/15/21 Yes [name], MD  polyethylene glycol [facility] / Floria Raveling) packet Take 17 g by mouth daily as needed for mild constipation.   Yes [provider]  TRESIBA FLEXTOUCH 200 UNIT/ML FlexTouch Pen INJECT 76 UNITS INTO THE SKIN DAILY. 04/24/20 04/24/21 Yes [name], MD  Insulin Pen Needle 32G X 4 MM MISC USE AS DIRECTED TO INJECT DIABETES MEDICATION 5 TIMES DAILY. 07/15/20 07/15/21  [name], MD     No family history on file.  Social History   Socioeconomic History  . Marital status: Single    Spouse name: Not on file  . Number of children: Not on file  . Years of education: Not on file  . Highest education level: Not on file  Occupational History  . Not on file  Tobacco Use  . Smoking status: Never Smoker  . Smokeless tobacco: Never Used  Substance and Sexual Activity  . Alcohol use: Never  . Drug use: Never  . Sexual activity: Not on file  Other Topics Concern  . Not on file  Social History Narrative   Lives in [place] with roommate. Sister lives close by. 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  All other systems are negative.  Review of Systems  Constitutional: Positive for fatigue. Negative for appetite change.  Eyes: Positive for visual disturbance.       Glaucoma  Respiratory: Negative for cough and shortness of breath.   Cardiovascular: Negative for chest pain and leg swelling.  Gastrointestinal: Negative for  abdominal pain, diarrhea, nausea and vomiting.  Neurological: Negative for dizziness and headaches.    Vital Signs: BP (!) 154/78 (BP Location: Left Arm)   Pulse 88   Temp 98.4 F (36.9 C) (Oral)   Resp 20   Ht 5\' 7"  (1.702 m)   Wt 250 lb (113.4 kg)   LMP 04/24/2014   SpO2 99%   BMI 39.16 kg/m   Physical Exam Constitutional:      General: She is not in acute distress.    Appearance: She is not ill-appearing.  HENT:     Mouth/Throat:     Mouth: Mucous membranes are moist.     Pharynx: Oropharynx is clear.  Cardiovascular:     Rate and Rhythm: Normal rate and regular rhythm.     Pulses: Normal pulses.     Heart sounds: Normal heart sounds.     Comments: Right radial site (from diagnostic arteriogram) clean and dry Pulmonary:     Effort: Pulmonary effort is normal.     Breath sounds:  Normal breath sounds.  Abdominal:     General: Bowel sounds are normal.     Palpations: Abdomen is soft.  Skin:    General: Skin is warm and dry.  Neurological:     Mental Status: She is alert and oriented to person, place, and time.     Imaging: MR ANGIO HEAD WO CONTRAST  Result Date: 07/24/2020 CLINICAL DATA:  Difficulty speaking EXAM: MRA HEAD WITHOUT CONTRAST TECHNIQUE: Angiographic images of the Circle of Willis were obtained using MRA technique without intravenous contrast. COMPARISON:  Brain MRI 07/23/2020 FINDINGS: POSTERIOR CIRCULATION: --Vertebral arteries: Normal --Inferior cerebellar arteries: Normal. --Basilar artery: Normal. --Superior cerebellar arteries: Normal. --Posterior cerebral arteries: Normal. ANTERIOR CIRCULATION: --Intracranial internal carotid arteries: Normal. --Anterior cerebral arteries (ACA): Normal. --Middle cerebral arteries (MCA): There is short segment occlusion of the left MCA proximal M2 segments superior division. Moderate stenosis of the superior division of the right MCA proximal M2 segment. ANATOMIC VARIANTS: None IMPRESSION: 1. Short segment occlusion of  the left MCA proximal M2 segment superior division. 2. Moderate stenosis of the superior division of the right MCA proximal M2 segment. 3. These results were called by telephone at the time of interpretation on 07/24/2020 at 1:03 am to provider [facility] , who verbally acknowledged these results. These results were communicated to Dr. Kerney Elbe at 1:03 am on 07/24/2020 by text page via the [facility] messaging system. Electronically Signed   By: Ulyses Jarred M.D.   On: 07/24/2020 01:04   MR BRAIN WO CONTRAST  Result Date: 07/23/2020 CLINICAL DATA:  Neurological deficit.  Acute stroke suspected. EXAM: MRI HEAD WITHOUT CONTRAST TECHNIQUE: Multiplanar, multiecho pulse sequences of the brain and surrounding structures were obtained without intravenous contrast. COMPARISON:  CT 03/22/2020.  MRI 03/21/2020. FINDINGS: Brain: There are several acute infarctions within the left hemisphere extending from the posterior frontal cortical and subcortical brain to the parieto-occipital cortical and subcortical brain. Findings could be watershed or embolic. Mild swelling of the regions of acute infarction. Petechial blood products in the left parieto-occipital region infarctions. Minimal petechial blood products in the largest region of posterior frontal infarction. No focal abnormality affects the brainstem or cerebellum. Right cerebral hemisphere shows a few old small vessel infarctions. Left cerebral hemisphere shows old watershed infarctions which were acute in December of 2021. No hydrocephalus or extra-axial collection. Vascular: Major vessels at the base of the brain show flow. Skull and upper cervical spine: Negative Sinuses/Orbits: Clear/normal Other: None IMPRESSION: 1. Several acute infarctions in the left hemisphere extending from the posterior frontal cortical and subcortical brain to the parieto-occipital cortical and subcortical brain. Findings could be watershed or embolic. Petechial blood products in the  largest region of posterior frontal infarction and left parieto-occipital infarction. 2. 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 Electronically Signed   By: Nelson Chimes M.D.   On: 07/23/2020 17:28   VAS [place] ABI WITH/WO TBI  Result Date: 07/24/2020 LOWER EXTREMITY DOPPLER STUDY Indications: LLE "cool" - BLE warm to touch upon examination. High Risk Factors: Hypertension, hyperlipidemia, Diabetes, no history of                    smoking, prior CVA.  Comparison Study: No previous exam Performing Technologist: Hill, Jody RVT, RDMS  Examination Guidelines: A complete evaluation includes at minimum, Doppler waveform signals and systolic blood pressure reading at the level of bilateral brachial, anterior tibial, and posterior tibial arteries, when vessel segments are accessible. Bilateral testing is considered an integral part of a  complete examination. Photoelectric Plethysmograph (PPG) waveforms and toe systolic pressure readings are included as required and additional duplex testing as needed. Limited examinations for reoccurring indications may be performed as noted.  ABI Findings: +---------+------------------+-----+---------+---------------+ Right    Rt Pressure (mmHg)IndexWaveform Comment         +---------+------------------+-----+---------+---------------+ Brachial 145                    triphasic                +---------+------------------+-----+---------+---------------+ PTA                             biphasic noncompressible +---------+------------------+-----+---------+---------------+ DP       232               1.60 triphasic                +---------+------------------+-----+---------+---------------+ Marchelle Gearing               1.06 Normal                   +---------+------------------+-----+---------+---------------+ +---------+------------------+-----+---------+---------------------------------+ Left     Lt Pressure (mmHg)IndexWaveform Comment                            +---------+------------------+-----+---------+---------------------------------+ Brachial 143                    biphasic                                   +---------+------------------+-----+---------+---------------------------------+ PTA                             triphasicnoncompressible                   +---------+------------------+-----+---------+---------------------------------+ DP       226               1.56 triphasic                                  +---------+------------------+-----+---------+---------------------------------+ Great Toe101               0.70 Normal   borderline normal to mild small                                            vessel disease                    +---------+------------------+-----+---------+---------------------------------+  Summary: Right: Resting right ankle-brachial index indicates noncompressible right lower extremity arteries. The right toe-brachial index is normal. Left: Resting left ankle-brachial index indicates noncompressible left lower extremity arteries. The left toe-brachial index is normal.  *See table(s) above for measurements and observations.  Electronically signed by Harold Barban MD on 07/24/2020 at 9:28:50 PM.   Final    ECHOCARDIOGRAM COMPLETE  Result Date: 07/24/2020    ECHOCARDIOGRAM REPORT   Patient Name:   Sharon Blankenship Date of Exam: 07/24/2020 Medical Rec #:  [id number]     Height:       67.0 in Accession #:    [id number]  Weight:       250.0 lb Date of Birth:  05/04/1965     BSA:          2.223 m Patient Age:    42 years      BP:           158/89 mmHg Patient Gender: F             HR:           93 bpm. Exam Location:  Inpatient Procedure: 2D Echo, Cardiac Doppler and Color Doppler Indications:    CVA  History:        Patient has prior history of Echocardiogram examinations, most                 recent 03/23/2020. Stroke; Risk Factors:Hypertension and                 Diabetes.  Sonographer:    Luisa Hart  RDCS Referring Phys: [id number] Courtney Paris  Sonographer Comments: Patient is morbidly obese. No cardiac surgeries noted on chart IMPRESSIONS  1. Abnormal septal motion . Left ventricular ejection fraction, by estimation, is 55 to 60%. The left ventricle has normal function. The left ventricle has no regional wall motion abnormalities. There is mild left ventricular hypertrophy. Left ventricular diastolic parameters were normal.  2. Right ventricular systolic function is normal. The right ventricular size is normal. There is normal pulmonary artery systolic pressure.  3. The mitral valve is abnormal. No evidence of mitral valve regurgitation. No evidence of mitral stenosis.  4. The aortic valve is tricuspid. Aortic valve regurgitation is not visualized. Mild to moderate aortic valve sclerosis/calcification is present, without any evidence of aortic stenosis.  5. The inferior vena cava is normal in size with greater than 50% respiratory variability, suggesting right atrial pressure of 3 mmHg. FINDINGS  Left Ventricle: Abnormal septal motion. Left ventricular ejection fraction, by estimation, is 55 to 60%. The left ventricle has normal function. The left ventricle has no regional wall motion abnormalities. The left ventricular internal cavity size was normal in size. There is mild left ventricular hypertrophy. Left ventricular diastolic parameters were normal. Right Ventricle: The right ventricular size is normal. No increase in right ventricular wall thickness. Right ventricular systolic function is normal. There is normal pulmonary artery systolic pressure. The tricuspid regurgitant velocity is 2.48 m/s, and  with an assumed right atrial pressure of 3 mmHg, the estimated right ventricular systolic pressure is 0000000 mmHg. Left Atrium: Left atrial size was normal in size. Right Atrium: Right atrial size was normal in size. Pericardium: There is no evidence of pericardial effusion. Mitral Valve: The mitral valve  is abnormal. There is moderate thickening of the mitral valve leaflet(s). There is moderate calcification of the mitral valve leaflet(s). Mild mitral annular calcification. No evidence of mitral valve regurgitation. No evidence of mitral valve stenosis. Tricuspid Valve: The tricuspid valve is normal in structure. Tricuspid valve regurgitation is trivial. No evidence of tricuspid stenosis. Aortic Valve: The aortic valve is tricuspid. Aortic valve regurgitation is not visualized. Mild to moderate aortic valve sclerosis/calcification is present, without any evidence of aortic stenosis. Aortic valve mean gradient measures 4.0 mmHg. Aortic valve peak gradient measures 8.9 mmHg. Pulmonic Valve: The pulmonic valve was normal in structure. Pulmonic valve regurgitation is trivial. No evidence of pulmonic stenosis. Aorta: The aortic root is normal in size and structure. Venous: The inferior vena cava is normal in size with greater than 50% respiratory variability, suggesting right  atrial pressure of 3 mmHg. IAS/Shunts: The interatrial septum was not well visualized.  LEFT VENTRICLE PLAX 2D LVIDd:         3.20 cm     Diastology LVIDs:         2.00 cm     LV e' medial:    5.66 cm/s LV PW:         1.40 cm     LV E/e' medial:  13.9 LV IVS:        1.25 cm     LV e' lateral:   7.72 cm/s                            LV E/e' lateral: 10.2  LV Volumes (MOD) LV vol d, MOD A2C: 51.1 ml LV vol d, MOD A4C: 33.4 ml LV vol s, MOD A2C: 27.4 ml LV vol s, MOD A4C: 15.2 ml LV SV MOD A2C:     23.7 ml LV SV MOD A4C:     33.4 ml LV SV MOD BP:      22.0 ml RIGHT VENTRICLE RV S prime:     11.10 cm/s TAPSE (M-mode): 1.6 cm LEFT ATRIUM             Index       RIGHT ATRIUM          Index LA diam:        2.90 cm 1.30 cm/m  RA Area:     7.14 cm LA Vol (A2C):   25.9 ml 11.65 ml/m RA Volume:   10.20 ml 4.59 ml/m LA Vol (A4C):   32.8 ml 14.75 ml/m LA Biplane Vol: 29.5 ml 13.27 ml/m  AORTIC VALVE                   PULMONIC VALVE AV Vmax:           149.00  cm/s PV Vmax:       0.87 m/s AV Vmean:          95.800 cm/s PV Vmean:      71.200 cm/s AV VTI:            0.238 m     PV VTI:        0.199 m AV Peak Grad:      8.9 mmHg    PV Peak grad:  3.0 mmHg AV Mean Grad:      4.0 mmHg    PV Mean grad:  2.0 mmHg LVOT Vmax:         93.30 cm/s LVOT Vmean:        55.700 cm/s LVOT VTI:          0.147 m LVOT/AV VTI ratio: 0.62  AORTA Ao Root diam: 3.40 cm Ao Asc diam:  3.20 cm MITRAL VALVE                TRICUSPID VALVE MV Area (PHT): 3.83 cm     TR Peak grad:   24.6 mmHg MV Decel Time: 198 msec     TR Vmax:        248.00 cm/s MV E velocity: 78.80 cm/s MV A velocity: 108.00 cm/s  SHUNTS MV E/A ratio:  0.73         Systemic VTI: 0.15 m Jenkins Rouge MD Electronically signed by Jenkins Rouge MD Signature Date/Time: 07/24/2020/11:57:51 AM    Final    VAS US CAROTID (at [facility] and [facility] only)  Result Date: 07/24/2020 Carotid Arterial Duplex Study Indications:       CVA and Speech disturbance. Risk Factors:      Hypertension, hyperlipidemia, Diabetes, no history of                    smoking, prior CVA. Limitations        Today's exam was limited due to the body habitus of the                    patient. Comparison Study:  No previous exams. CTA performed 03/22/20. Performing Technologist: Rogelia Rohrer  Examination Guidelines: A complete evaluation includes B-mode imaging, spectral Doppler, color Doppler, and power Doppler as needed of all accessible portions of each vessel. Bilateral testing is considered an integral part of a complete examination. Limited examinations for reoccurring indications may be performed as noted.  Right Carotid Findings: +----------+--------+--------+--------+------------------+------------------+           PSV cm/sEDV cm/sStenosisPlaque DescriptionComments           +----------+--------+--------+--------+------------------+------------------+ CCA Prox  91      18                                                    +----------+--------+--------+--------+------------------+------------------+ CCA Distal62      17                                intimal thickening +----------+--------+--------+--------+------------------+------------------+ ICA Prox  80      30                                                   +----------+--------+--------+--------+------------------+------------------+ ICA Distal57      22                                                   +----------+--------+--------+--------+------------------+------------------+ ECA       92      14                                                   +----------+--------+--------+--------+------------------+------------------+ +----------+--------+-------+----------------+-------------------+           PSV cm/sEDV cmsDescribe        Arm Pressure (mmHG) +----------+--------+-------+----------------+-------------------+ [id number]             Multiphasic, WNL                    +----------+--------+-------+----------------+-------------------+ +---------+--------+--+--------+--+---------+ VertebralPSV cm/s41EDV cm/s12Antegrade +---------+--------+--+--------+--+---------+  Left Carotid Findings: +----------+--------+--------+--------+------------------+------------------+           PSV cm/sEDV cm/sStenosisPlaque DescriptionComments           +----------+--------+--------+--------+------------------+------------------+ CCA Prox  77      14  intimal thickening +----------+--------+--------+--------+------------------+------------------+ CCA Distal68      14                                intimal thickening +----------+--------+--------+--------+------------------+------------------+ ICA Prox  70      27                                                   +----------+--------+--------+--------+------------------+------------------+ ICA Distal81      33                                                    +----------+--------+--------+--------+------------------+------------------+ ECA       76      11                                                   +----------+--------+--------+--------+------------------+------------------+ +----------+--------+--------+----------------+-------------------+           PSV cm/sEDV cm/sDescribe        Arm Pressure (mmHG) +----------+--------+--------+----------------+-------------------+ [id number]              Multiphasic, WNL                    +----------+--------+--------+----------------+-------------------+ +---------+--------+--+--------+--+---------+ VertebralPSV cm/s58EDV cm/s14Antegrade +---------+--------+--+--------+--+---------+   Summary: Right Carotid: The extracranial vessels were near-normal with only minimal wall                thickening or plaque. Left Carotid: The extracranial vessels were near-normal with only minimal wall               thickening or plaque. Vertebrals:  Bilateral vertebral arteries demonstrate antegrade flow. Subclavians: Normal flow hemodynamics were seen in bilateral subclavian              arteries. *See table(s) above for measurements and observations.  Electronically signed by Harold Barban MD on 07/24/2020 at 9:28:20 PM.    Final     Labs:  CBC: Recent Labs    07/25/20 0530 07/26/20 0351 07/27/20 0211 07/28/20 0310  WBC 6.0 7.3 7.2 7.1  HGB 9.9* 9.3* 9.4* 9.4*  HCT 30.4* 28.1* 29.2* 29.1*  PLT 256 256 258 273    COAGS: Recent Labs    07/23/20 1155 07/28/20 0310  INR 1.1 1.1  APTT 36  --     BMP: Recent Labs    04/24/20 1501 06/05/20 1520 07/25/20 0530 07/26/20 0351 07/27/20 0211 07/28/20 0310  NA 140   < > 142 141 140 141  K 4.9   < > 3.8 3.2* 3.7 3.7  CL 104   < > 115* 117* 116* 114*  CO2 21   < > 18* 17* 17* 20*  GLUCOSE 139*   < > 135* 120* 168* 202*  BUN 16   < > 16 12 11 12   CALCIUM 9.2   < > 8.9 8.2* 8.5* 8.8*  CREATININE 1.04*   < >  1.49* 1.17* 1.13* 1.03*  GFRNONAA 62   < > 42* 56* 59* >60  GFRAA 71  --   --   --   --   --    < > = values in this interval not displayed.    LIVER FUNCTION TESTS: Recent Labs    03/21/20 1157 06/05/20 1520 07/23/20 1155  BILITOT 0.9 0.3 0.4  AST 29 12 18   ALT 61* 13 15  ALKPHOS 136* 183* 124  PROT 7.6 7.4 7.0  ALBUMIN 3.7 4.6 3.5    TUMOR MARKERS: No results for input(s): AFPTM, CEA, CA199, CHROMGRNA in the last 8760 hours.  Assessment and Plan:  Left MCA stenosis: Sharon Blankenship, 55 year old female, is scheduled today for a cerebral arteriogram with intervention/stenting of the left MCA. This procedure will be done under general anesthesia.  Risks and benefits of this procedure were discussed with the patient including, but not limited to bleeding, infection, vascular injury or contrast induced renal failure.  This interventional procedure involves the use of X-rays and because of the nature of the planned procedure, it is possible that we will have prolonged use of X-ray fluoroscopy.  Potential radiation risks to you include (but are not limited to) the following: - A slightly elevated risk for cancer  several years later in life. This risk is typically less than 0.5% percent. This risk is low in comparison to the normal incidence of human cancer, which is 33% for women and 50% for men according to the [place]. - Radiation induced injury can include skin redness, resembling a rash, tissue breakdown / ulcers and hair loss (which can be temporary or permanent).   The likelihood of either of these occurring depends on the difficulty of the procedure and whether you are sensitive to radiation due to previous procedures, disease, or genetic conditions.   IF your procedure requires a prolonged use of radiation, you will be notified and given written instructions for further action.  It is your responsibility to monitor the irradiated area for the 2 weeks  following the procedure and to notify your physician if you are concerned that you have suffered a radiation induced injury.    All of the patient's questions were answered, patient is agreeable to proceed.  Consent signed and in chart.  Thank you for this interesting consult.  I greatly enjoyed meeting Sharon Blankenship and look forward to participating in their care.  A copy of this report was sent to the requesting provider on this date.  Electronically Signed: Soyla Dryer, AGACNP-BC [phone] 07/28/2020, 8:50 AM   I spent a total of 20 Minutes    in face to face in clinical consultation, greater than 50% of which was counseling/coordinating care for cerebral arteriogram with intervention.

## 2020-07-28 NOTE — Anesthesia Procedure Notes (Signed)
Procedure Name: Intubation Date/Time: 07/28/2020 10:23 AM Performed by: Renato Shin, CRNA Pre-anesthesia Checklist: Patient identified, Emergency Drugs available, Suction available and Patient being monitored Patient Re-evaluated:Patient Re-evaluated prior to induction Oxygen Delivery Method: Circle system utilized Preoxygenation: Pre-oxygenation with 100% oxygen Induction Type: IV induction Ventilation: Mask ventilation without difficulty Laryngoscope Size: Miller and 3 Grade View: Grade I Tube type: Oral Tube size: 7.0 mm Number of attempts: 1 Airway Equipment and Method: Stylet and Oral airway Placement Confirmation: ETT inserted through vocal cords under direct vision,  positive ETCO2 and breath sounds checked- equal and bilateral Secured at: 21 cm Tube secured with: Tape Dental Injury: Teeth and Oropharynx as per pre-operative assessment

## 2020-07-28 NOTE — Progress Notes (Signed)
Physical Therapy Treatment Patient Details Name: Sharon Blankenship MRN: [id number] DOB: 08-15-1965 Today's Date: 07/28/2020    History of Present Illness 55 y.o. female presents to [facility] ED on 07/23/2020 with reports of word finding difficulties and headache. 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 MRI on 4/20 demonstrates Several acute infarctions in the left hemisphere extending from the posterior frontal cortical and subcortical brain to the parieto-occipital cortical and subcortical brain. PMH includes several acute infarctions in the left hemisphere extending from the posterior frontal cortical and subcortical brain to the parieto-occipital cortical and subcortical brain.    PT Comments    Pt progressing towards physical therapy goals. She reports she has been using the RW intermittently in the room but feels comfortable without it. Her goal is to be able to manage without the RW at d/c. Feel she would benefit from a [facility] for some stability as she is very guarded throughout ambulation due to chronic visual deficits. Session limited as transport came to get pt for IR procedure. Will continue to follow and progress as able per POC.     Follow Up Recommendations  Home health PT     Equipment Recommendations  None recommended by PT    Recommendations for Other Services       Precautions / Restrictions Precautions Precautions: Fall Precaution Comments: Difficulties with dynamic balance tasks and peripheral visual field deficits. Restrictions Weight Bearing Restrictions: No    Mobility  Bed Mobility Overal bed mobility: Modified Independent             General bed mobility comments: Increased time but overall able to manage bed mobility without assistance.    Transfers Overall transfer level: Modified independent Equipment used: None             General transfer comment: Slow rise to stand but no unsteadiness or overt LOB  noted.  Ambulation/Gait Ambulation/Gait assistance: Supervision;Min guard Gait Distance (Feet): 250 Feet Assistive device: 1 person hand held assist;None Gait Pattern/deviations: Step-through pattern;Decreased stride length;Drifts right/left Gait velocity: Decreased Gait velocity interpretation: <1.8 ft/sec, indicate of risk for recurrent falls General Gait Details: Initially without AD as pt states she wants to get away from the RW if possible. HHA provided as pt reports feeling more comfortable with some support due to visual deficits. Pt reports reduced speed in unfamiliar environments secondary to chronic visual deficits as well and states that this is normal for her.   Stairs Stairs: Yes Stairs assistance: Min guard Stair Management: Two rails;Step to pattern;Forwards Number of Stairs: 2 General stair comments: VC's for general safety and cueing pt to find the edge of the step before advancing to the next on descent 2 visual deficits.   Wheelchair Mobility    Modified Rankin (Stroke Patients Only) Modified Rankin (Stroke Patients Only) Pre-Morbid Rankin Score: No significant disability Modified Rankin: Moderately severe disability     Balance Overall balance assessment: Mild deficits observed, not formally tested                                          Cognition Arousal/Alertness: Awake/alert Behavior During Therapy: WFL for tasks assessed/performed Overall Cognitive Status: Within Functional Limits for tasks assessed  Exercises      General Comments        Pertinent Vitals/Pain Pain Assessment: No/denies pain    Home Living                      Prior Function            PT Goals (current goals can now be found in the care plan section) Acute Rehab PT Goals Patient Stated Goal: Be able to manage without the walker PT Goal Formulation: With patient Time For Goal  Achievement: 08/08/20 Potential to Achieve Goals: Good Progress towards PT goals: Progressing toward goals    Frequency    Min 4X/week      PT Plan Current plan remains appropriate    Co-evaluation              AM-PAC PT "6 Clicks" Mobility   Outcome Measure  Help needed turning from your back to your side while in a flat bed without using bedrails?: None Help needed moving from lying on your back to sitting on the side of a flat bed without using bedrails?: None Help needed moving to and from a bed to a chair (including a wheelchair)?: None Help needed standing up from a chair using your arms (e.g., wheelchair or bedside chair)?: None Help needed to walk in hospital room?: A Little Help needed climbing 3-5 steps with a railing? : A Little 6 Click Score: 22    End of Session Equipment Utilized During Treatment: Gait belt Activity Tolerance: Patient tolerated treatment well Patient left: in bed;with nursing/sitter in room Nurse Communication: Mobility status PT Visit Diagnosis: Unsteadiness on feet (R26.81);Other abnormalities of gait and mobility (R26.89);Other symptoms and signs involving the nervous system (Y30.160)     Time: 1093-2355 PT Time Calculation (min) (ACUTE ONLY): 15 min  Charges:  $Gait Training: 8-22 mins                     Sharon Blankenship, PT, DPT Acute Rehabilitation Services Pager: [phone] Office: [phone]    Sharon Blankenship 07/28/2020, 10:03 AM

## 2020-07-28 NOTE — Sedation Documentation (Signed)
Pt transported to PACU bay 14 via bed accompanied by RN and CRNA. Report given at bedside to [facility]. Handoff completed. Pt awake alert and oriented, following commands. Right groin remains level 0 and distal bilateral lower distal pulses present and audible via doppler. No s/s of distress at this time.

## 2020-07-28 NOTE — Progress Notes (Signed)
SLP Cancellation Note  Patient Details Name: Sharon Blankenship MRN: [id number] DOB: 11/22/1965   Cancelled treatment:       Reason Eval/Treat Not Completed: Patient at procedure or test/unavailable; pt off of floor for procedure x2 when ST attempted session.  Will continue efforts as able.   Elvina Sidle, M.S., CCC-SLP 07/28/2020, 11:49 AM

## 2020-07-28 NOTE — Transfer of Care (Signed)
Immediate Anesthesia Transfer of Care Note  Patient: Sharon Blankenship  Procedure(s) Performed: LEFT MIDDLE CEREBRAL  STENT (Left )  Patient Location: PACU  Anesthesia Type:General  Level of Consciousness: awake and patient cooperative  Airway & Oxygen Therapy: Patient Spontanous Breathing  Post-op Assessment: Report given to RN, Post -op Vital signs reviewed and stable, Patient moving all extremities X 4 and Patient able to stick tongue midline  Post vital signs: Reviewed and stable  Last Vitals:  Vitals Value Taken Time  BP 150/71 07/28/20 1333  Temp    Pulse 81 07/28/20 1333  Resp 11 07/28/20 1333  SpO2 100 % 07/28/20 1333  Vitals shown include unvalidated device data.  Last Pain:  Vitals:   07/28/20 0757  TempSrc: Oral  PainSc:          Complications: No complications documented.

## 2020-07-28 NOTE — Progress Notes (Signed)
ANTICOAGULATION CONSULT NOTE - Initial Consult  Pharmacy Consult for IV heparin Indication: s/p neuro IR procedure, Lt MCA stent  Allergies  Allergen Reactions  . Aloe Shortness Of Breath and Rash    SOB, Swelling, Rash, Itching  . Penicillins Hives, Itching and Swelling    Did it involve swelling of the face/tongue/throat, SOB, or low BP? Y Did it involve sudden or severe rash/hives, skin peeling, or any reaction on the inside of your mouth or nose? N Did you need to seek medical attention at a hospital or doctor's office? Y When did it last happen? Several Years Ago If all above answers are "NO", may proceed with cephalosporin use.     Patient Measurements: Height: 5\' 7"  (170.2 cm) Weight: 113.4 kg (250 lb) IBW/kg (Calculated) : 61.6 Heparin Dosing Weight: ~ 90 kg  Vital Signs: Temp: 98.1 F (36.7 C) (04/25 1310) Temp Source: Oral (04/25 0757) BP: 154/78 (04/25 0757) Pulse Rate: 88 (04/25 0757)  Labs: Recent Labs    07/26/20 0351 07/27/20 0211 07/28/20 0310  HGB 9.3* 9.4* 9.4*  HCT 28.1* 29.2* 29.1*  PLT 256 258 273  LABPROT  --   --  14.3  INR  --   --  1.1  CREATININE 1.17* 1.13* 1.03*    Estimated Creatinine Clearance: 83 mL/min (A) (by C-G formula based on SCr of 1.03 mg/dL (H)).   Medical History: Past Medical History:  Diagnosis Date  . CVA (cerebral vascular accident) ([place])   . Diabetes mellitus without complication ([place])   . Hypertension   . Open-angle glaucoma     Medications:  Infusions:  . sodium chloride    . clevidipine    . heparin 500 Units/hr (07/28/20 1355)    Assessment: 55 yo female s/p neuro IR procedure, pharmacy asked to increase heparin to target heparin level of 0.1-0.25.  Heparin currently running at 500 units/hr.  Goal of Therapy:  Heparin level 0.1-0.25 Monitor platelets by anticoagulation protocol: Yes   Plan:  Increase IV heparin to 900 units/hr. Check heparin level 6 hrs after rate increased. Heparin  level off at 8 AM as per orders.  [place] Crane, Roylene Reason, BCCP Clinical Pharmacist  07/28/2020 3:45 PM   [facility] pharmacy phone numbers are listed on [email or web address]

## 2020-07-29 ENCOUNTER — Encounter (HOSPITAL_COMMUNITY): Payer: Self-pay | Admitting: Interventional Radiology

## 2020-07-29 LAB — CBC WITH DIFFERENTIAL/PLATELET
Abs Immature Granulocytes: 0.06 10*3/uL (ref 0.00–0.07)
Basophils Absolute: 0 10*3/uL (ref 0.0–0.1)
Basophils Relative: 0 %
Eosinophils Absolute: 0 10*3/uL (ref 0.0–0.5)
Eosinophils Relative: 0 %
HCT: 27.5 % — ABNORMAL LOW (ref 36.0–46.0)
Hemoglobin: 8.9 g/dL — ABNORMAL LOW (ref 12.0–15.0)
Immature Granulocytes: 1 %
Lymphocytes Relative: 9 %
Lymphs Abs: 1 10*3/uL (ref 0.7–4.0)
MCH: 26.5 pg (ref 26.0–34.0)
MCHC: 32.4 g/dL (ref 30.0–36.0)
MCV: 81.8 fL (ref 80.0–100.0)
Monocytes Absolute: 0.4 10*3/uL (ref 0.1–1.0)
Monocytes Relative: 3 %
Neutro Abs: 9.6 10*3/uL — ABNORMAL HIGH (ref 1.7–7.7)
Neutrophils Relative %: 87 %
Platelets: 292 10*3/uL (ref 150–400)
RBC: 3.36 MIL/uL — ABNORMAL LOW (ref 3.87–5.11)
RDW: 15 % (ref 11.5–15.5)
WBC: 11 10*3/uL — ABNORMAL HIGH (ref 4.0–10.5)
nRBC: 0 % (ref 0.0–0.2)

## 2020-07-29 LAB — GLUCOSE, CAPILLARY
Glucose-Capillary: 273 mg/dL — ABNORMAL HIGH (ref 70–99)
Glucose-Capillary: 236 mg/dL — ABNORMAL HIGH (ref 70–99)
Glucose-Capillary: 179 mg/dL — ABNORMAL HIGH (ref 70–99)
Glucose-Capillary: 263 mg/dL — ABNORMAL HIGH (ref 70–99)

## 2020-07-29 LAB — BASIC METABOLIC PANEL
Anion gap: 11 (ref 5–15)
BUN: 18 mg/dL (ref 6–20)
CO2: 12 mmol/L — ABNORMAL LOW (ref 22–32)
Calcium: 8 mg/dL — ABNORMAL LOW (ref 8.9–10.3)
Chloride: 112 mmol/L — ABNORMAL HIGH (ref 98–111)
Creatinine, Ser: 1.45 mg/dL — ABNORMAL HIGH (ref 0.44–1.00)
GFR, Estimated: 43 mL/min — ABNORMAL LOW (ref >60)
Glucose, Bld: 384 mg/dL — ABNORMAL HIGH (ref 70–99)
Potassium: 4.4 mmol/L (ref 3.5–5.1)
Sodium: 135 mmol/L (ref 135–145)

## 2020-07-29 LAB — HEPARIN LEVEL (UNFRACTIONATED): Heparin Unfractionated: 0.18 IU/mL — ABNORMAL LOW (ref 0.30–0.70)

## 2020-07-29 MED ORDER — LABETALOL HCL 5 MG/ML IV SOLN
20.0000 mg | INTRAVENOUS | Status: DC | PRN
  Administered 2020-07-29 – 2020-07-30 (×3): 20 mg via INTRAVENOUS
  Filled 2020-07-29 (×3): qty 4

## 2020-07-29 NOTE — Progress Notes (Signed)
Physical Therapy Treatment Patient Details Name: Sharon Blankenship MRN: [id number] DOB: [birth year] Today's Date: 07/29/2020    History of Present Illness 55 y.o. female presents to [facility] ED on 07/23/2020 with reports of word finding difficulties and headache. 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 MRI on 4/20 demonstrates Several acute infarctions in the left hemisphere extending from the posterior frontal cortical and subcortical brain to the parieto-occipital cortical and subcortical brain.S/p revascularization of L MCA 4/25.  PMH includes several acute infarctions in the left hemisphere extending from the posterior frontal cortical and subcortical brain to the parieto-occipital cortical and subcortical brain.    PT Comments    Pt s/p revascularization of L MCA 4/25. Pt displays visual deficits (chronic), balance deficits, gait abnormalities, and R sided weakness/decreased coordination. Pt ambulating x 120 feet with a walker at a min assist level. Reporting mild dizziness, however BP is stable. Recommended use of walker currently for increased stability and support. D/c plan remains appropriate.     Follow Up Recommendations  Home health PT;Supervision for mobility/OOB     Equipment Recommendations  None recommended by PT    Recommendations for Other Services       Precautions / Restrictions Precautions Precautions: Fall Precaution Comments: visual deficits Restrictions Weight Bearing Restrictions: No    Mobility  Bed Mobility Overal bed mobility: Modified Independent             General bed mobility comments: Increased time, cues for sequencing    Transfers Overall transfer level: Needs assistance Equipment used: None Transfers: Sit to/from Stand Sit to Stand: Min guard            Ambulation/Gait Ambulation/Gait assistance: Min guard;Min assist Gait Distance (Feet): 120 Feet Assistive device: Rolling walker (2  wheeled) Gait Pattern/deviations: Step-through pattern;Decreased stride length;Drifts right/left Gait velocity: Decreased   General Gait Details: Pt requiring cues for sequencing/environmental negotiation due to visual deficits (baseline) and increased R foot clearance. Up to minA for walker negotiation around obstacles and to balance with no hand support when walking up to sink without AD   Stairs             Wheelchair Mobility    Modified Rankin (Stroke Patients Only) Modified Rankin (Stroke Patients Only) Pre-Morbid Rankin Score: No significant disability Modified Rankin: Moderately severe disability     Balance Overall balance assessment: Needs assistance Sitting-balance support: Feet supported Sitting balance-Leahy Scale: Fair     Standing balance support: No upper extremity supported Standing balance-Leahy Scale: Poor Standing balance comment: Requiring at least single UE support at sink                            Cognition Arousal/Alertness: Awake/alert Behavior During Therapy: WFL for tasks assessed/performed Overall Cognitive Status: Impaired/Different from baseline Area of Impairment: Safety/judgement;Awareness                         Safety/Judgement: Decreased awareness of safety;Decreased awareness of deficits Awareness: Emergent   General Comments: Pt mildly impulsive and attributes her right sided deficits to her "blood pressure cuff."      Exercises      General Comments        Pertinent Vitals/Pain Pain Assessment: No/denies pain    Home Living                      Prior  Function            PT Goals (current goals can now be found in the care plan section) Acute Rehab PT Goals Patient Stated Goal: Be able to manage without the walker PT Goal Formulation: With patient Time For Goal Achievement: 08/08/20 Potential to Achieve Goals: Good Progress towards PT goals: Progressing toward goals     Frequency    Min 4X/week      PT Plan Current plan remains appropriate    Co-evaluation              AM-PAC PT "6 Clicks" Mobility   Outcome Measure  Help needed turning from your back to your side while in a flat bed without using bedrails?: None Help needed moving from lying on your back to sitting on the side of a flat bed without using bedrails?: None Help needed moving to and from a bed to a chair (including a wheelchair)?: A Little Help needed standing up from a chair using your arms (e.g., wheelchair or bedside chair)?: A Little Help needed to walk in hospital room?: A Little Help needed climbing 3-5 steps with a railing? : A Little 6 Click Score: 20    End of Session Equipment Utilized During Treatment: Gait belt Activity Tolerance: Patient tolerated treatment well Patient left: with nursing/sitter in room;Other (comment) (in bathroom seated at sink) Nurse Communication: Mobility status PT Visit Diagnosis: Unsteadiness on feet (R26.81);Other abnormalities of gait and mobility (R26.89);Other symptoms and signs involving the nervous system (R29.898)     Time: 2536-6440 PT Time Calculation (min) (ACUTE ONLY): 31 min  Charges:  $Gait Training: 8-22 mins $Therapeutic Activity: 8-22 mins                     Wyona Almas, PT, DPT Acute Rehabilitation Services Pager [phone] Office [phone]    Deno Etienne 07/29/2020, 5:15 PM

## 2020-07-29 NOTE — Progress Notes (Signed)
Spoke with Susannah RN via secure chat and she is aware of the DC central line order.

## 2020-07-29 NOTE — Progress Notes (Addendum)
STROKE TEAM PROGRESS NOTE   SUBJECTIVE (INTERVAL HISTORY) Patient had an uneventful night.  She is sitting up in bed.  No complaints.  Patient is alert and oriented.  She is still on Cleviprex drip for blood pressure control and IV heparin drip was discontinued this morning.  Neurologically She is sedation stable and unchanged  OBJECTIVE Temp:  [97.8 F (36.6 C)-99.2 F (37.3 C)] 98.6 F (37 C) (04/26 1200) Pulse Rate:  [85-106] 90 (04/26 1430) Cardiac Rhythm: Normal sinus rhythm (04/25 2000) Resp:  [4-22] 7 (04/26 1500) BP: (99-145)/(42-75) 103/56 (04/26 1500) SpO2:  [91 %-100 %] 100 % (04/26 1500) Arterial Line BP: (118-188)/(45-65) 153/59 (04/26 1400)  Recent Labs  Lab 07/28/20 1309 07/28/20 2053 07/28/20 2330 07/29/20 0735 07/29/20 1121  GLUCAP 190* 429* 379* 273* 236*   Recent Labs  Lab 07/25/20 0530 07/26/20 0351 07/27/20 0211 07/28/20 0310 07/29/20 0114  NA 142 141 140 141 135  K 3.8 3.2* 3.7 3.7 4.4  CL 115* 117* 116* 114* 112*  CO2 18* 17* 17* 20* 12*  GLUCOSE 135* 120* 168* 202* 384*  BUN 16 12 11 12 18   CREATININE 1.49* 1.17* 1.13* 1.03* 1.45*  CALCIUM 8.9 8.2* 8.5* 8.8* 8.0*   Recent Labs  Lab 07/23/20 1155  AST 18  ALT 15  ALKPHOS 124  BILITOT 0.4  PROT 7.0  ALBUMIN 3.5   Recent Labs  Lab 07/25/20 0530 07/26/20 0351 07/27/20 0211 07/28/20 0310 07/29/20 0114  WBC 6.0 7.3 7.2 7.1 11.0*  NEUTROABS 4.0 4.6 4.1 4.0 9.6*  HGB 9.9* 9.3* 9.4* 9.4* 8.9*  HCT 30.4* 28.1* 29.2* 29.1* 27.5*  MCV 81.3 80.1 81.6 81.3 81.8  PLT 256 256 258 273 292   No results for input(s): CKTOTAL, CKMB, CKMBINDEX, TROPONINI in the last 168 hours. Recent Labs    07/28/20 0310  LABPROT 14.3  INR 1.1   No results for input(s): COLORURINE, LABSPEC, PHURINE, GLUCOSEU, HGBUR, BILIRUBINUR, KETONESUR, PROTEINUR, UROBILINOGEN, NITRITE, LEUKOCYTESUR in the last 72 hours.  Invalid input(s): APPERANCEUR     Component Value Date/Time   CHOL 108 07/24/2020 0155   CHOL  133 04/24/2020 1501   TRIG 113 07/24/2020 0155   HDL 24 (L) 07/24/2020 0155   HDL 39 (L) 04/24/2020 1501   CHOLHDL 4.5 07/24/2020 0155   VLDL 23 07/24/2020 0155   LDLCALC 61 07/24/2020 0155   LDLCALC 74 04/24/2020 1501   Lab Results  Component Value Date   HGBA1C 7.7 (H) 07/24/2020      Component Value Date/Time   LABOPIA NONE DETECTED 07/23/2020 1444   COCAINSCRNUR NONE DETECTED 07/23/2020 1444   LABBENZ NONE DETECTED 07/23/2020 1444   AMPHETMU NONE DETECTED 07/23/2020 1444   THCU NONE DETECTED 07/23/2020 1444   LABBARB NONE DETECTED 07/23/2020 1444    No results for input(s): ETH in the last 168 hours.   MR ANGIO HEAD WO CONTRAST  Result Date: 07/24/2020 CLINICAL DATA:  Difficulty speaking EXAM: MRA HEAD WITHOUT CONTRAST TECHNIQUE: Angiographic images of the Circle of Willis were obtained using MRA technique without intravenous contrast. COMPARISON:  Brain MRI 07/23/2020 FINDINGS: POSTERIOR CIRCULATION: --Vertebral arteries: Normal --Inferior cerebellar arteries: Normal. --Basilar artery: Normal. --Superior cerebellar arteries: Normal. --Posterior cerebral arteries: Normal. ANTERIOR CIRCULATION: --Intracranial internal carotid arteries: Normal. --Anterior cerebral arteries (ACA): Normal. --Middle cerebral arteries (MCA): There is short segment occlusion of the left MCA proximal M2 segments superior division. Moderate stenosis of the superior division of the right MCA proximal M2 segment. ANATOMIC VARIANTS: None IMPRESSION: 1. Short  segment occlusion of the left MCA proximal M2 segment superior division. 2. Moderate stenosis of the superior division of the right MCA proximal M2 segment. 3. These results were called by telephone at the time of interpretation on 07/24/2020 at 1:03 am to provider [facility] , who verbally acknowledged these results. These results were communicated to Dr. Kerney Elbe at 1:03 am on 07/24/2020 by text page via the [facility] messaging system. Electronically  Signed   By: Ulyses Jarred M.D.   On: 07/24/2020 01:04   MR BRAIN WO CONTRAST  Result Date: 07/23/2020 CLINICAL DATA:  Neurological deficit.  Acute stroke suspected. EXAM: MRI HEAD WITHOUT CONTRAST TECHNIQUE: Multiplanar, multiecho pulse sequences of the brain and surrounding structures were obtained without intravenous contrast. COMPARISON:  CT 03/22/2020.  MRI 03/21/2020. FINDINGS: Brain: There are several acute infarctions within the left hemisphere extending from the posterior frontal cortical and subcortical brain to the parieto-occipital cortical and subcortical brain. Findings could be watershed or embolic. Mild swelling of the regions of acute infarction. Petechial blood products in the left parieto-occipital region infarctions. Minimal petechial blood products in the largest region of posterior frontal infarction. No focal abnormality affects the brainstem or cerebellum. Right cerebral hemisphere shows a few old small vessel infarctions. Left cerebral hemisphere shows old watershed infarctions which were acute in December of 2021. No hydrocephalus or extra-axial collection. Vascular: Major vessels at the base of the brain show flow. Skull and upper cervical spine: Negative Sinuses/Orbits: Clear/normal Other: None IMPRESSION: 1. Several acute infarctions in the left hemisphere extending from the posterior frontal cortical and subcortical brain to the parieto-occipital cortical and subcortical brain. Findings could be watershed or embolic. Petechial blood products in the largest region of posterior frontal infarction and left parieto-occipital infarction. 2. 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 Electronically Signed   By: Nelson Chimes M.D.   On: 07/23/2020 17:28   DG CHEST PORT 1 VIEW  Result Date: 07/28/2020 CLINICAL DATA:  New right IJ CVC EXAM: PORTABLE CHEST 1 VIEW COMPARISON:  March 21, 2020. FINDINGS: Right internal jugular central venous catheter with tip overlying the SVC. The heart size  and mediastinal contours are within normal limits. No focal consolidation. No pleural effusion. No visible pneumothorax. Elevation the right hemidiaphragm. The visualized skeletal structures are unremarkable. IMPRESSION: Right internal jugular central venous catheter with tip overlying the SVC. No visible pneumothorax. Electronically Signed   By: Dahlia Bailiff MD   On: 07/28/2020 15:55   VAS [place] ABI WITH/WO TBI  Result Date: 07/24/2020 LOWER EXTREMITY DOPPLER STUDY Indications: LLE "cool" - BLE warm to touch upon examination. High Risk Factors: Hypertension, hyperlipidemia, Diabetes, no history of                    smoking, prior CVA.  Comparison Study: No previous exam Performing Technologist: Hill, Jody RVT, RDMS  Examination Guidelines: A complete evaluation includes at minimum, Doppler waveform signals and systolic blood pressure reading at the level of bilateral brachial, anterior tibial, and posterior tibial arteries, when vessel segments are accessible. Bilateral testing is considered an integral part of a complete examination. Photoelectric Plethysmograph (PPG) waveforms and toe systolic pressure readings are included as required and additional duplex testing as needed. Limited examinations for reoccurring indications may be performed as noted.  ABI Findings: +---------+------------------+-----+---------+---------------+ Right    Rt Pressure (mmHg)IndexWaveform Comment         +---------+------------------+-----+---------+---------------+ Brachial 145  triphasic                +---------+------------------+-----+---------+---------------+ PTA                             biphasic noncompressible +---------+------------------+-----+---------+---------------+ DP       232               1.60 triphasic                +---------+------------------+-----+---------+---------------+ Marchelle Gearing               1.06 Normal                    +---------+------------------+-----+---------+---------------+ +---------+------------------+-----+---------+---------------------------------+ Left     Lt Pressure (mmHg)IndexWaveform Comment                           +---------+------------------+-----+---------+---------------------------------+ Brachial 143                    biphasic                                   +---------+------------------+-----+---------+---------------------------------+ PTA                             triphasicnoncompressible                   +---------+------------------+-----+---------+---------------------------------+ DP       226               1.56 triphasic                                  +---------+------------------+-----+---------+---------------------------------+ Great Toe101               0.70 Normal   borderline normal to mild small                                            vessel disease                    +---------+------------------+-----+---------+---------------------------------+  Summary: Right: Resting right ankle-brachial index indicates noncompressible right lower extremity arteries. The right toe-brachial index is normal. Left: Resting left ankle-brachial index indicates noncompressible left lower extremity arteries. The left toe-brachial index is normal.  *See table(s) above for measurements and observations.  Electronically signed by Harold Barban MD on 07/24/2020 at 9:28:50 PM.   Final    ECHOCARDIOGRAM COMPLETE  Result Date: 07/24/2020    ECHOCARDIOGRAM REPORT   Patient Name:   CAIRA POCHE Date of Exam: 07/24/2020 Medical Rec #:  [id number]     Height:       67.0 in Accession #:    [id number]    Weight:       250.0 lb Date of Birth:  1965/04/24     BSA:          2.223 m Patient Age:    39 years      BP:           158/89 mmHg Patient Gender: F  HR:           93 bpm. Exam Location:  Inpatient Procedure: 2D Echo, Cardiac Doppler and Color Doppler  Indications:    CVA  History:        Patient has prior history of Echocardiogram examinations, most                 recent 03/23/2020. Stroke; Risk Factors:Hypertension and                 Diabetes.  Sonographer:    Luisa Hart RDCS Referring Phys: [id number] Courtney Paris  Sonographer Comments: Patient is morbidly obese. No cardiac surgeries noted on chart IMPRESSIONS  1. Abnormal septal motion . Left ventricular ejection fraction, by estimation, is 55 to 60%. The left ventricle has normal function. The left ventricle has no regional wall motion abnormalities. There is mild left ventricular hypertrophy. Left ventricular diastolic parameters were normal.  2. Right ventricular systolic function is normal. The right ventricular size is normal. There is normal pulmonary artery systolic pressure.  3. The mitral valve is abnormal. No evidence of mitral valve regurgitation. No evidence of mitral stenosis.  4. The aortic valve is tricuspid. Aortic valve regurgitation is not visualized. Mild to moderate aortic valve sclerosis/calcification is present, without any evidence of aortic stenosis.  5. The inferior vena cava is normal in size with greater than 50% respiratory variability, suggesting right atrial pressure of 3 mmHg. FINDINGS  Left Ventricle: Abnormal septal motion. Left ventricular ejection fraction, by estimation, is 55 to 60%. The left ventricle has normal function. The left ventricle has no regional wall motion abnormalities. The left ventricular internal cavity size was normal in size. There is mild left ventricular hypertrophy. Left ventricular diastolic parameters were normal. Right Ventricle: The right ventricular size is normal. No increase in right ventricular wall thickness. Right ventricular systolic function is normal. There is normal pulmonary artery systolic pressure. The tricuspid regurgitant velocity is 2.48 m/s, and  with an assumed right atrial pressure of 3 mmHg, the estimated right  ventricular systolic pressure is 32.9 mmHg. Left Atrium: Left atrial size was normal in size. Right Atrium: Right atrial size was normal in size. Pericardium: There is no evidence of pericardial effusion. Mitral Valve: The mitral valve is abnormal. There is moderate thickening of the mitral valve leaflet(s). There is moderate calcification of the mitral valve leaflet(s). Mild mitral annular calcification. No evidence of mitral valve regurgitation. No evidence of mitral valve stenosis. Tricuspid Valve: The tricuspid valve is normal in structure. Tricuspid valve regurgitation is trivial. No evidence of tricuspid stenosis. Aortic Valve: The aortic valve is tricuspid. Aortic valve regurgitation is not visualized. Mild to moderate aortic valve sclerosis/calcification is present, without any evidence of aortic stenosis. Aortic valve mean gradient measures 4.0 mmHg. Aortic valve peak gradient measures 8.9 mmHg. Pulmonic Valve: The pulmonic valve was normal in structure. Pulmonic valve regurgitation is trivial. No evidence of pulmonic stenosis. Aorta: The aortic root is normal in size and structure. Venous: The inferior vena cava is normal in size with greater than 50% respiratory variability, suggesting right atrial pressure of 3 mmHg. IAS/Shunts: The interatrial septum was not well visualized.  LEFT VENTRICLE PLAX 2D LVIDd:         3.20 cm     Diastology LVIDs:         2.00 cm     LV e' medial:    5.66 cm/s LV PW:         1.40 cm  LV E/e' medial:  13.9 LV IVS:        1.25 cm     LV e' lateral:   7.72 cm/s                            LV E/e' lateral: 10.2  LV Volumes (MOD) LV vol d, MOD A2C: 51.1 ml LV vol d, MOD A4C: 33.4 ml LV vol s, MOD A2C: 27.4 ml LV vol s, MOD A4C: 15.2 ml LV SV MOD A2C:     23.7 ml LV SV MOD A4C:     33.4 ml LV SV MOD BP:      22.0 ml RIGHT VENTRICLE RV S prime:     11.10 cm/s TAPSE (M-mode): 1.6 cm LEFT ATRIUM             Index       RIGHT ATRIUM          Index LA diam:        2.90 cm 1.30 cm/m   RA Area:     7.14 cm LA Vol (A2C):   25.9 ml 11.65 ml/m RA Volume:   10.20 ml 4.59 ml/m LA Vol (A4C):   32.8 ml 14.75 ml/m LA Biplane Vol: 29.5 ml 13.27 ml/m  AORTIC VALVE                   PULMONIC VALVE AV Vmax:           149.00 cm/s PV Vmax:       0.87 m/s AV Vmean:          95.800 cm/s PV Vmean:      71.200 cm/s AV VTI:            0.238 m     PV VTI:        0.199 m AV Peak Grad:      8.9 mmHg    PV Peak grad:  3.0 mmHg AV Mean Grad:      4.0 mmHg    PV Mean grad:  2.0 mmHg LVOT Vmax:         93.30 cm/s LVOT Vmean:        55.700 cm/s LVOT VTI:          0.147 m LVOT/AV VTI ratio: 0.62  AORTA Ao Root diam: 3.40 cm Ao Asc diam:  3.20 cm MITRAL VALVE                TRICUSPID VALVE MV Area (PHT): 3.83 cm     TR Peak grad:   24.6 mmHg MV Decel Time: 198 msec     TR Vmax:        248.00 cm/s MV E velocity: 78.80 cm/s MV A velocity: 108.00 cm/s  SHUNTS MV E/A ratio:  0.73         Systemic VTI: 0.15 m Charlton Haws MD Electronically signed by Charlton Haws MD Signature Date/Time: 07/24/2020/11:57:51 AM    Final    VAS US CAROTID (at [facility] and [facility] only)  Result Date: 07/24/2020 Carotid Arterial Duplex Study Indications:       CVA and Speech disturbance. Risk Factors:      Hypertension, hyperlipidemia, Diabetes, no history of                    smoking, prior CVA. Limitations        Today's exam was limited due to the body habitus of the  patient. Comparison Study:  No previous exams. CTA performed 03/22/20. Performing Technologist: Rogelia Rohrer  Examination Guidelines: A complete evaluation includes B-mode imaging, spectral Doppler, color Doppler, and power Doppler as needed of all accessible portions of each vessel. Bilateral testing is considered an integral part of a complete examination. Limited examinations for reoccurring indications may be performed as noted.  Right Carotid Findings: +----------+--------+--------+--------+------------------+------------------+           PSV cm/sEDV  cm/sStenosisPlaque DescriptionComments           +----------+--------+--------+--------+------------------+------------------+ CCA Prox  91      18                                                   +----------+--------+--------+--------+------------------+------------------+ CCA Distal62      17                                intimal thickening +----------+--------+--------+--------+------------------+------------------+ ICA Prox  80      30                                                   +----------+--------+--------+--------+------------------+------------------+ ICA Distal57      22                                                   +----------+--------+--------+--------+------------------+------------------+ ECA       92      14                                                   +----------+--------+--------+--------+------------------+------------------+ +----------+--------+-------+----------------+-------------------+           PSV cm/sEDV cmsDescribe        Arm Pressure (mmHG) +----------+--------+-------+----------------+-------------------+ [id number]             Multiphasic, WNL                    +----------+--------+-------+----------------+-------------------+ +---------+--------+--+--------+--+---------+ VertebralPSV cm/s41EDV cm/s12Antegrade +---------+--------+--+--------+--+---------+  Left Carotid Findings: +----------+--------+--------+--------+------------------+------------------+           PSV cm/sEDV cm/sStenosisPlaque DescriptionComments           +----------+--------+--------+--------+------------------+------------------+ CCA Prox  77      14                                intimal thickening +----------+--------+--------+--------+------------------+------------------+ CCA Distal68      14                                intimal thickening  +----------+--------+--------+--------+------------------+------------------+ ICA Prox  70      27                                                   +----------+--------+--------+--------+------------------+------------------+  ICA Distal81      33                                                   +----------+--------+--------+--------+------------------+------------------+ ECA       76      11                                                   +----------+--------+--------+--------+------------------+------------------+ +----------+--------+--------+----------------+-------------------+           PSV cm/sEDV cm/sDescribe        Arm Pressure (mmHG) +----------+--------+--------+----------------+-------------------+ [id number]              Multiphasic, WNL                    +----------+--------+--------+----------------+-------------------+ +---------+--------+--+--------+--+---------+ VertebralPSV cm/s58EDV cm/s14Antegrade +---------+--------+--+--------+--+---------+   Summary: Right Carotid: The extracranial vessels were near-normal with only minimal wall                thickening or plaque. Left Carotid: The extracranial vessels were near-normal with only minimal wall               thickening or plaque. Vertebrals:  Bilateral vertebral arteries demonstrate antegrade flow. Subclavians: Normal flow hemodynamics were seen in bilateral subclavian              arteries. *See table(s) above for measurements and observations.  Electronically signed by Harold Barban MD on 07/24/2020 at 9:28:20 PM.    Final     PHYSICAL EXAM  Temp:  [97.8 F (36.6 C)-99.2 F (37.3 C)] 98.6 F (37 C) (04/26 1200) Pulse Rate:  [85-106] 90 (04/26 1430) Resp:  [4-22] 7 (04/26 1500) BP: (99-145)/(42-75) 103/56 (04/26 1500) SpO2:  [91 %-100 %] 100 % (04/26 1500) Arterial Line BP: (118-188)/(45-65) 153/59 (04/26 1400)  General -morbidly [name],  , in no  apparent distress. Cardiovascular - Regular rhythm and rate.  Mental Status -  Level of arousal and orientation to time, place, and person were intact. Language including expression, naming, repetition, comprehension was assessed and found intact.  Cranial Nerves II - XII - II - Visual field intact OU. III, IV, VI - Extraocular movements intact. V - Facial sensation intact bilaterally. VII - Facial movement intact bilaterally. VIII - Hearing & vestibular intact bilaterally. X - Palate elevates symmetrically. XI - Chin turning & shoulder shrug intact bilaterally. XII - Tongue protrusion intact.  Motor Strength - The patient's strength was normal in all extremities and pronator drift was absent.  Bulk was normal and fasciculations were absent.   Motor Tone - normal   Sensory - Light touch, temperature/pinprick were assessed and were symmetrical.    Coordination - The patient had normal movements in the hands with no ataxia or dysmetria.  Tremor was absent.  Gait and Station - deferred.   ASSESSMENT/PLAN Ms. Claudio Gaillard is a 55 y.o. 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 No tPA given due to outside window.    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  LDL 61  HgbA1c 7.7  P2Y12 25  Lovenox for VTE prophylaxis  aspirin 81 mg daily prior to admission, now on aspirin 81 mg daily and Brilinta (ticagrelor) 90 mg bid.  Patient counseled to be compliant with her antithrombotic medications  Ongoing aggressive stroke risk factor management  Therapy recommendations: OT: no followup needs; Home health  PT  Disposition: Pending  History of stroke  03/2020 admitted for right upper extremity numbness weakness, slurred speech and difficulty walking.  CT left frontal infarct.  MRI showed left MCA territory, CR, MCA/ACA and MCA/PCA watershed area infarcts.  MRI showed severe left M2 stenosis.  CTA head and neck left ICA atherosclerosis with plaque, left M2 severe stenosis.  LDL 159, A1c 6.6.  EF 55 to 60%.  Discharged on DAPT and Lipitor.  Open angle glaucoma  Was on Diamox 250 TID  plan for eye surgery 08/12/2020  Diamox (although IV more than po) can cause " reverse Robinhood phenomena" that can worsen left MCA perfusion, current off meds, recommend to hold if OK with ophthalmology until MCA stenting done  Diamox was currently on hold, okay to continue after MCA stenting  Diabetes  HgbA1c 7.7 goal < 7.0  Uncontrolled  Currently on Lantus  CBG monitoring  SSI  DM education and close PCP follow up  Hypertension . Stable . On Cozaar 100mg  daily . Avoid low BP  Long term BP goal 130-150 given left M2 high-grade stenosis.  Long-term BP goal normotensive after MCA stenting.  Hyperlipidemia  Home meds: Lipitor 20  LDL 61, goal < 70  Now on Lipitor 40  Continue statin at discharge  Other Stroke Risk Factors  Obesity, Body mass index is 39.16 kg/m.    Hospital day # 6 Patient continues to do well.  Discontinue IV heparin drip and continue dual antiplatelet therapy for 3 to 6 months.  Strict blood pressure control with systolic 0000000 and wean Cleviprex drip.  Discontinue arterial line and go with cuff pressure.  Mobilize out of bed.  Continue ongoing therapies.  Discussed with neuro interventional team and Dr. Shon Baton. .  Long discussion with patient and sister and answered questions. This patient is critically ill and at significant risk of neurological worsening, death and care requires constant monitoring of vital signs, hemodynamics,respiratory and cardiac monitoring,  extensive review of multiple databases, frequent neurological assessment, discussion with family, other specialists and medical decision making of high complexity.I have made any additions or clarifications directly to the above note.This critical care time does not reflect procedure time, or teaching time or supervisory time of PA/NP/Med Resident etc but could involve care discussion time.  I spent 30 minutes of neurocritical care time  in the care of  this patient.    Antony Contras, MD Medical Director [place] [facility] Pager: [phone] 07/29/2020 3:38 PM       To contact Stroke Continuity provider, please refer to [email or web address] After hours, contact General Neurology

## 2020-07-29 NOTE — Progress Notes (Addendum)
HD#6 Subjective:   No acute events overnight.  During evaluation this morning, patient states she is feeling "fine, tired." She is relieved to be through the stent placement. She states her impaired vision has been contributing significantly to her stress. She denies new visual deficits, numbness, weakness.   Objective:   Vital signs in last 24 hours: Vitals:   07/29/20 0200 07/29/20 0300 07/29/20 0400 07/29/20 0500  BP: 119/67 139/67 (!) 116/50 (!) 125/53  Pulse: (!) 103 (!) 103 93 93  Resp: (!) 21 20 20 17   Temp:   99.2 F (37.3 C)   TempSrc:   Oral   SpO2: 99% 98% 100% 100%  Weight:      Height:       Physical Exam Constitutional: tired-appearing woman lying in bed, awake and alert, in no acute distress, appears anxious HENT: normocephalic atraumatic, mucous membranes moist Cardiovascular: regular rate and rhythm, no m/r/g, R femoral puncture site soft without bleeding or hematoma, R IJ central line site without bleeding or hematoma Pulmonary/Chest: normal work of breathing on room air Neurological: alert & oriented to person, place, time, and situation. 5/5 grip strength.    Psych: anxious affect  Pertinent Labs: CBC Latest Ref Rng & Units 07/29/2020 07/28/2020 07/27/2020  WBC 4.0 - 10.5 K/uL 11.0(H) 7.1 7.2  Hemoglobin 12.0 - 15.0 g/dL 8.9(L) 9.4(L) 9.4(L)  Hematocrit 36.0 - 46.0 % 27.5(L) 29.1(L) 29.2(L)  Platelets 150 - 400 K/uL 292 273 258    CMP Latest Ref Rng & Units 07/29/2020 07/28/2020 07/27/2020  Glucose 70 - 99 mg/dL 384(H) 202(H) 168(H)  BUN 6 - 20 mg/dL 18 12 11   Creatinine 0.44 - 1.00 mg/dL 1.45(H) 1.03(H) 1.13(H)  Sodium 135 - 145 mmol/L 135 141 140  Potassium 3.5 - 5.1 mmol/L 4.4 3.7 3.7  Chloride 98 - 111 mmol/L 112(H) 114(H) 116(H)  CO2 22 - 32 mmol/L 12(L) 20(L) 17(L)  Calcium 8.9 - 10.3 mg/dL 8.0(L) 8.8(L) 8.5(L)  Total Protein 6.5 - 8.1 g/dL - - -  Total Bilirubin 0.3 - 1.2 mg/dL - - -  Alkaline Phos 38 - 126 U/L - - -  AST 15 - 41 U/L - - -   ALT 0 - 44 U/L - - -    Imaging:   DG CHEST PORT 1 VIEW  Result Date: 07/28/2020 CLINICAL DATA:  New right IJ CVC EXAM: PORTABLE CHEST 1 VIEW COMPARISON:  March 21, 2020. FINDINGS: Right internal jugular central venous catheter with tip overlying the SVC. The heart size and mediastinal contours are within normal limits. No focal consolidation. No pleural effusion. No visible pneumothorax. Elevation the right hemidiaphragm. The visualized skeletal structures are unremarkable. IMPRESSION: Right internal jugular central venous catheter with tip overlying the SVC. No visible pneumothorax. Electronically Signed   By: Dahlia Bailiff MD   On: 07/28/2020 15:55     Assessment/Plan:   Active Problems:   Secondary open-angle glaucoma of both eyes, severe stage   Hypertension   DM (diabetes mellitus), type 2, uncontrolled with complications ([place])   Hyperlipidemia   Stroke ([place])   Mild concentric left ventricular hypertrophy (LVH)   Middle cerebral artery stenosis, left   Patient Summary:  Sharon Blankenship is a 55 y.o. 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  Acute CVA, multiple left MCA territory subacute ischemic infarctions 2/2 MCA  territory stenosis s/p cerebral arteriogram with revascularization of left MCA using stent-assisted angioplasty via R femoral approach 07/28/20 Hx multiple left MCA territory embolic stroke (04/69) 2/2 symptomatic MCA territory stenosis and hypertension Patient tolerated stent placement well. She remains neurologically intact. Placed on Cleviprex drip following procedure. BP 110s-130s/50s-60s overnight. Plan to wean Cleviprex drip today per neurology.  - Neurology following, appreciate their expertise - Strict blood pressure control with systolic [phone] for  first 24 hours postprocedure.  Bedrest as per post intervention protocol. - Wean Cleviprex drip - Long-term BP goal normotensive after MCA stenting. - No OT follow-up, PT recommend HHPT - Continue DAPT (aspirin 81 mg daily and Brilinta 90 mg BID) - Spoke with patient's glaucoma specialist yesterday who does not require stopping DAPT prior to scheduled glaucoma surgery on 08/13/20.  - Telemetry monitoring - Frequent neurochecks   Essential hypertension Placed on Cleviprex drip following procedure. BP 110s-130s/50s-60s overnight. Plan to wean Cleviprex drip today per neurology.  - BP goal as above. - Continue home losartan 100 mg daily - Holding home Diamox 250 mg 3 times daily as above, will resume in the next 1-2 days  AKI Serum creatinine had improved back to baseline which appears to be 1-1.2, however elevated at 1.45 today. Suspect mild dehydration and contrast load contributing. Will continue to monitor. - encourage PO hydration - AM BMP  Type 2 diabetes Serum glucose elevated at 384 this morning, likely due to intraoperative dexamethasone. A1c 7.7.  Current regimen of Tresiba 76 units daily, Ozempic 1 mg daily, metformin 1000 mg twice daily.  Fiasp 18 units daily as needed.  Continue gabapentin 100 mg 3 times daily for nerve pain. -SSI, Lantus 50 units daily during admission   Hyperlipidemia continue home atorvastatin 20 mg daily.    Glaucoma History of glaucoma, on acetazolamide 250 mg 3 times daily. Holding as above. - Continue Xalatan solution both eyes daily, Cosopt 1 drop right eye twice daily, Alphagan 1 drop both eyes 3 times daily - Will resume Diamox after MCA stenting - As above, patient is scheduled for glaucoma surgery at [facility] on 08/13/20. Per her ophthalmologist, she will not need to stop DAPT prior to surgery.    Diet: Heart Healthy IVF: none VTE: Enoxaparin Code: Full PT/OT recs: HHPT (would have to be [facility] HHPT per TOC), no OT follow-up  needed    Please contact the on call pager after 5 pm and on weekends at [phone].  Alexandria Lodge, MD PGY-1 Internal Medicine Teaching Service Pager: [phone] 07/29/2020

## 2020-07-29 NOTE — Progress Notes (Signed)
Referring Physician(s): Rosalin Hawking (neurololgy)  Supervising Physician: Luanne Bras  Patient Status:  [facility] - In-pt  Chief Complaint: Left MCA stent F/U  Subjective:  Left MCA severe stenosis versus occlusion s/p cerebral arteriogram with revascularization of left MCA using stent assisted angioplasty via right femoral approach 07/28/2020 by Dr. Estanislado Pandy. Patient awake and alert laying in bed. States she is cold, otherwise no complaints. Can spontaneously move all extremities. Speech mildly dysarthric but improving. Right femoral puncture site c/d/i.   Allergies: Aloe and Penicillins  Medications: Prior to Admission medications   Medication Sig Start Date End Date Taking? Authorizing Provider  acetaZOLAMIDE (DIAMOX) 125 MG tablet Take 2 tablets (250 mg total) by mouth in the morning, at noon, in the evening, and at bedtime. Patient taking differently: Take 125 mg by mouth in the morning, at noon, in the evening, and at bedtime. 06/06/20 10/04/20 Yes Christian, Rylee, MD  aspirin 81 MG EC tablet Take 1 tablet (81 mg total) by mouth daily. Swallow whole. Patient taking differently: Take 81 mg by mouth daily. 06/06/20  Yes Christian, Rylee, MD  atorvastatin (LIPITOR) 20 MG tablet Take 1 tablet (20 mg total) by mouth daily. 06/06/20  Yes Christian, Rylee, MD  azelastine (ASTELIN) 0.1 % nasal spray Place 1 spray into both nostrils 2 (two) times daily as needed for allergies. 07/02/20  Yes [provider]  brimonidine (ALPHAGAN) 0.2 % ophthalmic solution Place 1 drop into both eyes 3 (three) times daily. 02/18/20  Yes [provider]  dorzolamide-timolol (COSOPT) 22.3-6.8 MG/ML ophthalmic solution Place 1 drop into the right eye 2 (two) times daily. 02/10/20  Yes [provider]  ferrous sulfate 325 (65 FE) MG tablet Take 325 mg by mouth daily.   Yes [provider]  FIASP FLEXTOUCH 100 UNIT/ML FlexTouch Pen Inject 18 Units into the skin daily as needed  (high blood sugar). 03/21/20  Yes [provider]  gabapentin (NEURONTIN) 100 MG capsule Take 1 capsule (100 mg total) by mouth 3 (three) times daily as needed (nerve pain). 06/06/20  Yes Christian, Rylee, MD  latanoprost (XALATAN) 0.005 % ophthalmic solution Place 1 drop into both eyes at bedtime. 02/12/20  Yes [provider]  loratadine (CLARITIN) 10 MG tablet Take 10 mg by mouth daily as needed for allergies. 01/17/20  Yes [provider]  losartan (COZAAR) 100 MG tablet Take 1 tablet (100 mg total) by mouth daily. 06/06/20 06/06/21 Yes Christian, Rylee, MD  meloxicam (MOBIC) 15 MG tablet Take 15 mg by mouth daily as needed for pain. 01/01/20  Yes [provider]  metFORMIN (GLUCOPHAGE) 1000 MG tablet Take 1 tablet (1,000 mg total) by mouth 2 (two) times daily with a meal. 06/06/20  Yes Christian, Rylee, MD  methocarbamol (ROBAXIN) 500 MG tablet Take 500 mg by mouth 3 (three) times daily as needed for muscle spasms. 03/04/20  Yes [provider]  montelukast (SINGULAIR) 10 MG tablet TAKE 1 TABLET (10 MG TOTAL) BY MOUTH DAILY. Patient taking differently: Take 10 mg by mouth at bedtime. 04/24/20 04/24/21 Yes [name], MD  Multiple Vitamin (MULTIVITAMIN PO) Take 1 tablet by mouth daily.   Yes [provider]  OZEMPIC, 1 MG/DOSE, 4 MG/3ML SOPN Inject 1 mg into the skin once a week. On Wednesday 06/06/20  Yes Christian, Rylee, MD  pantoprazole (PROTONIX) 40 MG tablet Take 1 tablet (40 mg total) by mouth daily. 07/15/20 07/15/21 Yes [name], MD  polyethylene glycol [facility] / Floria Raveling) packet Take 17 g  by mouth daily as needed for mild constipation.   Yes [provider]  TRESIBA FLEXTOUCH 200 UNIT/ML FlexTouch Pen INJECT 76 UNITS INTO THE SKIN DAILY. 04/24/20 04/24/21 Yes [name], MD  Insulin Pen Needle 32G X 4 MM MISC USE AS DIRECTED TO INJECT DIABETES MEDICATION 5 TIMES DAILY. 07/15/20 07/15/21  [name], MD     Vital Signs: BP  (!) 116/56   Pulse 89   Temp 97.8 F (36.6 C) (Oral)   Resp 18   Ht 5\' 7"  (1.702 m)   Wt 250 lb (113.4 kg)   LMP 04/24/2014   SpO2 100%   BMI 39.16 kg/m   Physical Exam Vitals and nursing note reviewed.  Constitutional:      General: She is not in acute distress.    Appearance: She is obese.  Pulmonary:     Effort: Pulmonary effort is normal. No respiratory distress.  Skin:    General: Skin is warm and dry.     Comments: Right femoral puncture site soft without active bleeding or hematoma.  Neurological:     Mental Status: She is alert.     Comments: Alert, awake, and oriented x3. Follows simple commands. Speech mildly dysarthric but improved since yesterday. PERRL bilaterally. No facial asymmetry. Tongue midline. Can spontaneously move all extremities with weakness of RUE (proximal RUE 4/5, RUE hand grip 5/5). No pronator drift. Fine motor and coordination intact but delayed on right. Distal pulses (DPs) palpable bilaterally.     Imaging: DG CHEST PORT 1 VIEW  Result Date: 07/28/2020 CLINICAL DATA:  New right IJ CVC EXAM: PORTABLE CHEST 1 VIEW COMPARISON:  March 21, 2020. FINDINGS: Right internal jugular central venous catheter with tip overlying the SVC. The heart size and mediastinal contours are within normal limits. No focal consolidation. No pleural effusion. No visible pneumothorax. Elevation the right hemidiaphragm. The visualized skeletal structures are unremarkable. IMPRESSION: Right internal jugular central venous catheter with tip overlying the SVC. No visible pneumothorax. Electronically Signed   By: Dahlia Bailiff MD   On: 07/28/2020 15:55    Labs:  CBC: Recent Labs    07/26/20 0351 07/27/20 0211 07/28/20 0310 07/29/20 0114  WBC 7.3 7.2 7.1 11.0*  HGB 9.3* 9.4* 9.4* 8.9*  HCT 28.1* 29.2* 29.1* 27.5*  PLT 256 258 273 292    COAGS: Recent Labs    07/23/20 1155 07/28/20 0310  INR 1.1 1.1  APTT 36  --     BMP: Recent Labs     04/24/20 1501 06/05/20 1520 07/26/20 0351 07/27/20 0211 07/28/20 0310 07/29/20 0114  NA 140   < > 141 140 141 135  K 4.9   < > 3.2* 3.7 3.7 4.4  CL 104   < > 117* 116* 114* 112*  CO2 21   < > 17* 17* 20* 12*  GLUCOSE 139*   < > 120* 168* 202* 384*  BUN 16   < > 12 11 12 18   CALCIUM 9.2   < > 8.2* 8.5* 8.8* 8.0*  CREATININE 1.04*   < > 1.17* 1.13* 1.03* 1.45*  GFRNONAA 62   < > 56* 59* >60 43*  GFRAA 71  --   --   --   --   --    < > = values in this interval not displayed.    LIVER FUNCTION TESTS: Recent Labs    03/21/20 1157 06/05/20 1520 07/23/20 1155  BILITOT 0.9 0.3 0.4  AST 29 12 18   ALT 61*  13 15  ALKPHOS 136* 183* 124  PROT 7.6 7.4 7.0  ALBUMIN 3.7 4.6 3.5    Assessment and Plan:  Left MCA severe stenosis versus occlusion s/p cerebral arteriogram with revascularization of left MCA using stent assisted angioplasty via right femoral approach 07/28/2020 by Dr. Estanislado Pandy. Patient's condition stable- moving all extremities with minimal RUE weakness, speech mildly dysarthric but improving. Right femoral puncture site stable, distal pulses (DPs) palpable bilaterally. Ok to remove a-line at this time. Recommend PT/OT evaluation. Continue taking Brilinta 90 mg twice daily and Aspirin 81 mg once daily. Plan to follow-up with Dr. Estanislado Pandy in consult on OP basis 2-3 weeks after discharge (NIR schedulers to call patient to set up this procedure). Further plans per IMTS/neurology- appreciate and agree with management. NIR will continue to follow peripherally, please call NIR with questions/concerns.   Electronically Signed: Earley Abide, PA-C 07/29/2020, 10:41 AM   I spent a total of 15 Minutes at the the patient's bedside AND on the patient's hospital floor or unit, greater than 50% of which was counseling/coordinating care for left MCA stenosis s/p revascularization.

## 2020-07-29 NOTE — Anesthesia Postprocedure Evaluation (Signed)
Anesthesia Post Note  Patient: Sharon Blankenship  Procedure(s) Performed: LEFT MIDDLE CEREBRAL  STENT (Left )     Patient location during evaluation: PACU Anesthesia Type: General Level of consciousness: awake and alert Pain management: pain level controlled Vital Signs Assessment: post-procedure vital signs reviewed and stable Respiratory status: spontaneous breathing, nonlabored ventilation, respiratory function stable and patient connected to nasal cannula oxygen Cardiovascular status: stable (On cleviprex to keep BP down closer to goal range) Postop Assessment: no apparent nausea or vomiting Anesthetic complications: no   No complications documented.  Last Vitals:  Vitals:   07/29/20 0800 07/29/20 0900  BP: (!) 104/42 (!) 118/52  Pulse: 87 93  Resp: (!) 9 18  Temp: 36.6 C   SpO2: 98% 100%    Last Pain:  Vitals:   07/29/20 0800  TempSrc: Oral  PainSc: 0-No pain                 Audry Pili

## 2020-07-29 NOTE — Progress Notes (Signed)
ANTICOAGULATION CONSULT NOTE  Pharmacy Consult for heparin Indication: s/p neuro IR procedure  Allergies  Allergen Reactions  . Aloe Shortness Of Breath and Rash    SOB, Swelling, Rash, Itching  . Penicillins Hives, Itching and Swelling    Did it involve swelling of the face/tongue/throat, SOB, or low BP? Y Did it involve sudden or severe rash/hives, skin peeling, or any reaction on the inside of your mouth or nose? N Did you need to seek medical attention at a hospital or doctor's office? Y When did it last happen? Several Years Ago If all above answers are "NO", may proceed with cephalosporin use.     Patient Measurements: Height: 5\' 7"  (170.2 cm) Weight: 113.4 kg (250 lb) IBW/kg (Calculated) : 61.6 Heparin Dosing Weight: ~ 90 kg  Vital Signs: Temp: 98.5 F (36.9 C) (04/26 0000) Temp Source: Oral (04/26 0000) BP: 117/59 (04/25 2100) Pulse Rate: 99 (04/25 2100)  Labs: Recent Labs    07/27/20 0211 07/28/20 0310 07/29/20 0051 07/29/20 0114  HGB 9.4* 9.4*  --  8.9*  HCT 29.2* 29.1*  --  27.5*  PLT 258 273  --  292  LABPROT  --  14.3  --   --   INR  --  1.1  --   --   HEPARINUNFRC  --   --  0.18*  --   CREATININE 1.13* 1.03*  --  1.45*    Estimated Creatinine Clearance: 59 mL/min (A) (by C-G formula based on SCr of 1.45 mg/dL (H)).  Assessment: 55 yo female s/p Lt MCA stent for heparin  Goal of Therapy:  Heparin level 0.1-0.25 Monitor platelets by anticoagulation protocol: Yes   Plan:  Continue Heparin at current rate   Sharon Blankenship, PharmD, BCPS

## 2020-07-30 ENCOUNTER — Other Ambulatory Visit (HOSPITAL_COMMUNITY): Payer: Self-pay

## 2020-07-30 LAB — BASIC METABOLIC PANEL
Anion gap: 10 (ref 5–15)
BUN: 17 mg/dL (ref 6–20)
CO2: 16 mmol/L — ABNORMAL LOW (ref 22–32)
Calcium: 8.3 mg/dL — ABNORMAL LOW (ref 8.9–10.3)
Chloride: 115 mmol/L — ABNORMAL HIGH (ref 98–111)
Creatinine, Ser: 1.33 mg/dL — ABNORMAL HIGH (ref 0.44–1.00)
GFR, Estimated: 48 mL/min — ABNORMAL LOW (ref >60)
Glucose, Bld: 219 mg/dL — ABNORMAL HIGH (ref 70–99)
Potassium: 3.6 mmol/L (ref 3.5–5.1)
Sodium: 141 mmol/L (ref 135–145)

## 2020-07-30 LAB — GLUCOSE, CAPILLARY
Glucose-Capillary: 188 mg/dL — ABNORMAL HIGH (ref 70–99)
Glucose-Capillary: 127 mg/dL — ABNORMAL HIGH (ref 70–99)
Glucose-Capillary: 205 mg/dL — ABNORMAL HIGH (ref 70–99)

## 2020-07-30 MED ORDER — ATORVASTATIN CALCIUM 40 MG PO TABS
40.0000 mg | ORAL_TABLET | Freq: Every day | ORAL | 0 refills | Status: DC
  Filled 2020-07-30: qty 30, 30d supply, fill #0

## 2020-07-30 MED ORDER — ATORVASTATIN CALCIUM 40 MG PO TABS: 40.0000 mg | ORAL_TABLET | Freq: Every day | ORAL | 0 refills | Status: DC

## 2020-07-30 MED ORDER — TICAGRELOR 90 MG PO TABS
90.0000 mg | ORAL_TABLET | Freq: Two times a day (BID) | ORAL | 0 refills | Status: DC
  Filled 2020-07-30: qty 60, 30d supply, fill #0

## 2020-07-30 MED ORDER — TICAGRELOR 90 MG PO TABS: 90.0000 mg | ORAL_TABLET | Freq: Two times a day (BID) | ORAL | 0 refills | Status: DC

## 2020-07-30 MED ORDER — ACETAZOLAMIDE 250 MG PO TABS
125.0000 mg | ORAL_TABLET | Freq: Three times a day (TID) | ORAL | Status: DC
  Administered 2020-07-30 (×2): 125 mg via ORAL
  Filled 2020-07-30 (×3): qty 1

## 2020-07-30 NOTE — Discharge Instructions (Signed)
Sharon Blankenship,  It was a pleasure taking care of you in the hospital. You were admitted after having a stroke. You were treated by neurologists and interventional radiologists. A stent was put in your brain to improve blood flow. You will continue taking aspirin as before, and you will also take another blood thinner which was started in the hospital, ticagrelor (Brilinta). Your statin dose was increased to atorvastatin (Lipitor) 40 mg daily. We held your Diamox initially, however now that you have the stent in place, it was restarted. You are safe to proceed with your glaucoma surgery on 08/13/20. You will be scheduled for follow-up with Dr. Estanislado Pandy (interventional radiologist, the specialist who placed your stent). Someone from clinic will also call you to schedule hospital follow-up.  Take care!   Femoral Site Care This sheet gives you information about how to care for yourself after your procedure. Your health care provider may also give you more specific instructions. If you have problems or questions, contact your health care provider. What can I expect after the procedure? After the procedure, it is common to have:  Bruising that usually fades within 1-2 weeks.  Tenderness at the site. Follow these instructions at home: Wound care 1. Follow instructions from your health care provider about how to take care of your insertion site. Make sure you: ? Wash your hands with soap and water before you change your bandage (dressing). If soap and water are not available, use hand sanitizer. ? Change your dressing as directed- pressure dressing removed 24 hours post-procedure (and switch for bandaid), bandaid removed 72 hours post-procedure 2. Do not take baths, swim, or use a hot tub for 7 days post-procedure. 3. You may shower 48 hours after the procedure or as told by your health care provider. ? Gently wash the site with plain soap and water. ? Pat the area dry with a clean towel. ? Do not rub  the site. This may cause bleeding. 4. Check your site every day for signs of infection. Check for: ? Redness, swelling, or pain. ? Fluid or blood. ? Warmth. ? Pus or a bad smell. Activity  Do not stoop, bend, or lift anything that is heavier than 10 lb (4.5 kg) for 2 weeks post-procedure.  Do not drive self for 2 weeks post-procedure. Contact a health care provider if you have:  A fever or chills.  You have redness, swelling, or pain around your insertion site. Get help right away if:  The catheter insertion area swells very fast.  You pass out.  You suddenly start to sweat or your skin gets clammy.  The catheter insertion area is bleeding, and the bleeding does not stop when you hold steady pressure on the area.  The area near or just beyond the catheter insertion site becomes pale, cool, tingly, or numb. These symptoms may represent a serious problem that is an emergency. Do not wait to see if the symptoms will go away. Get medical help right away. Call your local emergency services (911 in the [place] Do not drive yourself to the hospital.  This information is not intended to replace advice given to you by your health care provider. Make sure you discuss any questions you have with your health care provider. Document Revised: 04/04/2017 Document Reviewed: 04/04/2017 Elsevier Patient Education  2020 [employer].

## 2020-07-30 NOTE — Evaluation (Signed)
Occupational Therapy Evaluation Patient Details Name: Sharon Blankenship MRN: [id number] DOB: 12-Aug-1965 Today's Date: 07/30/2020    History of Present Illness 55 y.o. female presents to [facility] ED on 07/23/2020 with reports of word finding difficulties and headache. 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 MRI on 4/20 demonstrates Several acute infarctions in the left hemisphere extending from the posterior frontal cortical and subcortical brain to the parieto-occipital cortical and subcortical brain.S/p revascularization of L MCA 4/25.  PMH includes several acute infarctions in the left hemisphere extending from the posterior frontal cortical and subcortical brain to the parieto-occipital cortical and subcortical brain.   Clinical Impression   Pt admitted with above, and demonstrates the below listed deficits.  She currently requires min guard to min A for ADLs and demonstrates mild Rt UE weakness and decreased [place], mild balance deficits as well as impaired higher level cognitive deficits including decreased ability to generalize how her deficits will impact her functionally at home.  She reports she lives at home and will have intermittent assistance from her sister.  Recommend HHOT.     Follow Up Recommendations  Home health OT;Supervision - Intermittent    Equipment Recommendations  None recommended by OT    Recommendations for Other Services       Precautions / Restrictions Precautions Precautions: Fall Precaution Comments: visual deficits- h/o Lt visual field defict Restrictions Weight Bearing Restrictions: No      Mobility Bed Mobility Overal bed mobility: Modified Independent             General bed mobility comments: Increased time/effort    Transfers Overall transfer level: Needs assistance Equipment used: None Transfers: Sit to/from Stand;Stand Pivot Transfers Sit to Stand: Min guard Stand pivot transfers: Min guard        General transfer comment: pt mildly unsteady and requires min guard for safety    Balance Overall balance assessment: Needs assistance Sitting-balance support: Feet supported Sitting balance-Leahy Scale: Fair     Standing balance support: No upper extremity supported Standing balance-Leahy Scale: Fair Standing balance comment: able to maintain static standing without UE support                           ADL either performed or assessed with clinical judgement   ADL Overall ADL's : Needs assistance/impaired Eating/Feeding: Modified independent;Sitting   Grooming: Wash/dry hands;Wash/dry face;Oral care;Brushing hair;Min guard;Standing   Upper Body Bathing: Set up;Sitting   Lower Body Bathing: Min guard;Sit to/from stand   Upper Body Dressing : Supervision/safety;Sitting   Lower Body Dressing: Min guard;Sit to/from stand Lower Body Dressing Details (indicate cue type and reason): Pt struggled with donning socks and ultimately required min A as well as increased time and effort Toilet Transfer: Min guard;Ambulation;Comfort height toilet;RW   Toileting- Water quality scientist and Hygiene: Min guard;Sit to/from stand     Tub/Shower Transfer Details (indicate cue type and reason): discussed safety with tub transfers and showering with pt. She reports her sister will be present when she showers to assist her, and she agreed to sponge bathe if sister not present.  She deferred practice with tub transfer at this time Functional mobility during ADLs: Min guard;Rolling walker       Vision Baseline Vision/History: Wears glasses;Cataracts;Glaucoma Wears Glasses: At all times Patient Visual Report: Peripheral vision impairment;Blurring of vision;No change from baseline Additional Comments: Pt has h/o severe glaucoma bil. 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  She reports her left field deficit is stable.  She is able to read if the material she is reading is close,  but struggles with distant vision     Perception Perception Perception Tested?: Yes   Praxis Praxis Praxis tested?: Within functional limits    Pertinent Vitals/Pain Pain Assessment: No/denies pain     Hand Dominance Right   Extremity/Trunk Assessment Upper Extremity Assessment Upper Extremity Assessment: RUE deficits/detail RUE Deficits / Details: grossly 4/4 and mild incoordination Rt hand.  Pt denies sensory changes RUE Coordination: decreased fine motor;decreased gross motor   Lower Extremity Assessment Lower Extremity Assessment: Defer to PT evaluation   Cervical / Trunk Assessment Cervical / Trunk Assessment: Normal   Communication Communication Communication: No difficulties   Cognition Arousal/Alertness: Awake/alert Behavior During Therapy: Flat affect Overall Cognitive Status: Impaired/Different from baseline Area of Impairment: Attention;Awareness;Problem solving                   Current Attention Level: Alternating     Safety/Judgement: Decreased awareness of deficits Awareness: Intellectual Problem Solving: Slow processing General Comments: Pt is able to state that her Rt UE is weak, and that her coordination is not at baseline, but when she struggled with donning socks, stated "this won't be a problem"..my socks at home aren't this big".  She is unable to generalize how her deficits may impact her functionally. She scored 4/28 on the Short Blessed Test.  The items she missed were due to ? memory difficulties   General Comments       Exercises     Shoulder Instructions      Home Living Family/patient expects to be discharged to:: Private residence Living Arrangements: Alone Available Help at Discharge: Family;Available PRN/intermittently Type of Home: Apartment Home Access: Level entry     Home Layout: One level     Bathroom Shower/Tub: Teacher, early years/pre: Standard Bathroom Accessibility: No   Home Equipment: None    Additional Comments: Pt reports her sister and brother will be available as needed to assist her, and sister will be present when she takes a shower      Prior Functioning/Environment Level of Independence: Independent                 OT Problem List: Decreased activity tolerance;Decreased strength;Impaired balance (sitting and/or standing);Decreased coordination;Decreased cognition;Decreased safety awareness;Impaired UE functional use      OT Treatment/Interventions:      OT Goals(Current goals can be found in the care plan section) Acute Rehab OT Goals Patient Stated Goal: to get back home OT Goal Formulation: All assessment and education complete, DC therapy  OT Frequency:     Barriers to D/C:            Co-evaluation              AM-PAC OT "6 Clicks" Daily Activity     Outcome Measure Help from another person eating meals?: None Help from another person taking care of personal grooming?: A Little Help from another person toileting, which includes using toliet, bedpan, or urinal?: A Little Help from another person bathing (including washing, rinsing, drying)?: A Little Help from another person to put on and taking off regular upper body clothing?: A Little Help from another person to put on and taking off regular lower body clothing?: A Little 6 Click Score: 19   End of Session Nurse Communication: Mobility status  Activity Tolerance: Patient tolerated treatment well Patient  left: in bed;with call bell/phone within reach;with bed alarm set  OT Visit Diagnosis: Unsteadiness on feet (R26.81);Cognitive communication deficit (R41.841) Symptoms and signs involving cognitive functions: Cerebral infarction                Time: 1219-1242 OT Time Calculation (min): 23 min Charges:  OT General Charges $OT Visit: 1 Visit OT Evaluation $OT Eval Moderate Complexity: 1 Mod OT Treatments $Self Care/Home Management : 8-22 mins  Nilsa Nutting., OTR/L Acute Rehabilitation  Services Pager [phone] Office [place], [place] 07/30/2020, 1:44 PM

## 2020-07-30 NOTE — Progress Notes (Signed)
STROKE TEAM PROGRESS NOTE   SUBJECTIVE (INTERVAL HISTORY) Patient  is sitting up in bed.  No complaints.  Patient is alert and oriented.  She is offl   Cleviprex drip for blood pressure control neurological exam is unchanged she wants to go home.   Vital signs are stable. OBJECTIVE Temp:  [98.4 F (36.9 C)-98.9 F (37.2 C)] 98.5 F (36.9 C) (04/27 1200) Pulse Rate:  [85-106] 93 (04/27 1400) Cardiac Rhythm: Normal sinus rhythm (04/27 0800) Resp:  [0-28] 18 (04/27 1400) BP: (117-162)/(60-100) 143/75 (04/27 1400) SpO2:  [94 %-100 %] 94 % (04/27 1400)  Recent Labs  Lab 07/29/20 1121 07/29/20 1654 07/29/20 2117 07/30/20 0652 07/30/20 1150  GLUCAP 236* 179* 263* 188* 127*   Recent Labs  Lab 07/26/20 0351 07/27/20 0211 07/28/20 0310 07/29/20 0114 07/30/20 0416  NA 141 140 141 135 141  K 3.2* 3.7 3.7 4.4 3.6  CL 117* 116* 114* 112* 115*  CO2 17* 17* 20* 12* 16*  GLUCOSE 120* 168* 202* 384* 219*  BUN 12 11 12 18 17   CREATININE 1.17* 1.13* 1.03* 1.45* 1.33*  CALCIUM 8.2* 8.5* 8.8* 8.0* 8.3*   No results for input(s): AST, ALT, ALKPHOS, BILITOT, PROT, ALBUMIN in the last 168 hours. Recent Labs  Lab 07/25/20 0530 07/26/20 0351 07/27/20 0211 07/28/20 0310 07/29/20 0114  WBC 6.0 7.3 7.2 7.1 11.0*  NEUTROABS 4.0 4.6 4.1 4.0 9.6*  HGB 9.9* 9.3* 9.4* 9.4* 8.9*  HCT 30.4* 28.1* 29.2* 29.1* 27.5*  MCV 81.3 80.1 81.6 81.3 81.8  PLT 256 256 258 273 292   No results for input(s): CKTOTAL, CKMB, CKMBINDEX, TROPONINI in the last 168 hours. Recent Labs    07/28/20 0310  LABPROT 14.3  INR 1.1   No results for input(s): COLORURINE, LABSPEC, PHURINE, GLUCOSEU, HGBUR, BILIRUBINUR, KETONESUR, PROTEINUR, UROBILINOGEN, NITRITE, LEUKOCYTESUR in the last 72 hours.  Invalid input(s): APPERANCEUR     Component Value Date/Time   CHOL 108 07/24/2020 0155   CHOL 133 04/24/2020 1501   TRIG 113 07/24/2020 0155   HDL 24 (L) 07/24/2020 0155   HDL 39 (L) 04/24/2020 1501   CHOLHDL 4.5  07/24/2020 0155   VLDL 23 07/24/2020 0155   LDLCALC 61 07/24/2020 0155   LDLCALC 74 04/24/2020 1501   Lab Results  Component Value Date   HGBA1C 7.7 (H) 07/24/2020      Component Value Date/Time   LABOPIA NONE DETECTED 07/23/2020 1444   COCAINSCRNUR NONE DETECTED 07/23/2020 1444   LABBENZ NONE DETECTED 07/23/2020 1444   AMPHETMU NONE DETECTED 07/23/2020 1444   THCU NONE DETECTED 07/23/2020 1444   LABBARB NONE DETECTED 07/23/2020 1444    No results for input(s): ETH in the last 168 hours.   MR ANGIO HEAD WO CONTRAST  Result Date: 07/24/2020 CLINICAL DATA:  Difficulty speaking EXAM: MRA HEAD WITHOUT CONTRAST TECHNIQUE: Angiographic images of the Circle of Willis were obtained using MRA technique without intravenous contrast. COMPARISON:  Brain MRI 07/23/2020 FINDINGS: POSTERIOR CIRCULATION: --Vertebral arteries: Normal --Inferior cerebellar arteries: Normal. --Basilar artery: Normal. --Superior cerebellar arteries: Normal. --Posterior cerebral arteries: Normal. ANTERIOR CIRCULATION: --Intracranial internal carotid arteries: Normal. --Anterior cerebral arteries (ACA): Normal. --Middle cerebral arteries (MCA): There is short segment occlusion of the left MCA proximal M2 segments superior division. Moderate stenosis of the superior division of the right MCA proximal M2 segment. ANATOMIC VARIANTS: None IMPRESSION: 1. Short segment occlusion of the left MCA proximal M2 segment superior division. 2. Moderate stenosis of the superior division of the right MCA proximal M2  segment. 3. These results were called by telephone at the time of interpretation on 07/24/2020 at 1:03 am to provider [facility] , who verbally acknowledged these results. These results were communicated to Dr. Kerney Elbe at 1:03 am on 07/24/2020 by text page via the [facility] messaging system. Electronically Signed   By: Ulyses Jarred M.D.   On: 07/24/2020 01:04   MR BRAIN WO CONTRAST  Result Date: 07/23/2020 CLINICAL DATA:   Neurological deficit.  Acute stroke suspected. EXAM: MRI HEAD WITHOUT CONTRAST TECHNIQUE: Multiplanar, multiecho pulse sequences of the brain and surrounding structures were obtained without intravenous contrast. COMPARISON:  CT 03/22/2020.  MRI 03/21/2020. FINDINGS: Brain: There are several acute infarctions within the left hemisphere extending from the posterior frontal cortical and subcortical brain to the parieto-occipital cortical and subcortical brain. Findings could be watershed or embolic. Mild swelling of the regions of acute infarction. Petechial blood products in the left parieto-occipital region infarctions. Minimal petechial blood products in the largest region of posterior frontal infarction. No focal abnormality affects the brainstem or cerebellum. Right cerebral hemisphere shows a few old small vessel infarctions. Left cerebral hemisphere shows old watershed infarctions which were acute in December of 2021. No hydrocephalus or extra-axial collection. Vascular: Major vessels at the base of the brain show flow. Skull and upper cervical spine: Negative Sinuses/Orbits: Clear/normal Other: None IMPRESSION: 1. Several acute infarctions in the left hemisphere extending from the posterior frontal cortical and subcortical brain to the parieto-occipital cortical and subcortical brain. Findings could be watershed or embolic. Petechial blood products in the largest region of posterior frontal infarction and left parieto-occipital infarction. 2. 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 Electronically Signed   By: Nelson Chimes M.D.   On: 07/23/2020 17:28   IR Intra Cran Stent  Result Date: 07/30/2020 CLINICAL DATA:  Symptomatic left middle cerebral artery severe stenosis. Recurrent TIAs involving speech, and right-sided weakness and numbness despite being on antiplatelets. EXAM: INTRACRANIAL STENT (INCL PTA) COMPARISON:  Diagnostic arteriogram of July 25, 2020. MEDICATIONS: Heparin 3,000 units IV. Vancomycin 1  g IV antibiotic was administered within 1 hour of the procedure. ANESTHESIA/SEDATION: General anesthesia. CONTRAST:  Isovue 300 approximately 90 mL. FLUOROSCOPY TIME:  Fluoroscopy Time: 26 minutes 6 seconds (2592 mGy). COMPLICATIONS: None immediate. TECHNIQUE: Informed written consent was obtained from the patient after a thorough discussion of the procedural risks, benefits and alternatives. All questions were addressed. Maximal Sterile Barrier Technique was utilized including caps, mask, sterile gowns, sterile gloves, sterile drape, hand hygiene and skin antiseptic. A timeout was performed prior to the initiation of the procedure. The right groin was prepped and draped in the usual sterile fashion. Thereafter using modified Seldinger technique, transfemoral access into the right common femoral artery was obtained without difficulty. Over a 0.035 inch guidewire, an 8 French 25 Pinnacle sheath was inserted. Through this, and also over 0.035 inch guidewire, a 5 [place] JB 1 catheter was advanced to the aortic arch region and selectively positioned left common carotid artery. FINDINGS: The left common arteriogram demonstrates the left external carotid artery and its major branches to be widely patent. The left internal carotid artery at the bulb has approximately 50% stenosis secondary to a smooth atherosclerotic plaque. The vessel opacifies to the cranial skull base. The petrous, cavernous and supraclinoid segments are widely patent. The left middle cerebral artery again demonstrates a severe tapered stenosis of the distal left middle cerebral artery at the trifurcation extending into the origin of the dominant superior division of the left middle  cerebral artery. Hemodynamically slow flow is seen in the left MCA distribution compared to the left anterior cerebral artery distribution. The left anterior cerebral artery opacifies into the capillary and venous phases. ENDOVASCULAR REVASCULARIZATION OF THE SEVERE  SYMPTOMATIC STENOSIS OF THE DISTAL LEFT MIDDLE CEREBRAL ARTERY M1 SEGMENT EXTENDING INTO THE DOMINANT SUPERIOR DIVISION. The diagnostic JB 1 catheter in the left common carotid artery was then exchanged over a 0.035 inch 300 cm Rosen exchange guidewire for a 95 cm 6 [place] Infinity sheath. The guidewire was removed. Good aspiration obtained from the hub of the Infinity sheath. A gentle control arteriogram performed through the sheath demonstrated no change in the left common carotid artery bifurcation. Over a 0.035 inch Roadrunner guidewire a 5 French 115 cm Catalyst guide catheter was advanced to the proximal cavernous segment of the left internal carotid artery. The guidewire was removed. Good aspiration obtained from the hub of the Catalyst guide catheter. Control arteriogram performed through this guide demonstrated no change in the left intracranial or cerebral circulation. Magnified oblique views were then obtained. Measurements are performed of the left middle cerebral artery distal M1 segment, and the proximal inferior division. It was decided to proceed with placement of a 2.0 mm x 15 mm Resolute Onyx balloon mounted stent. Over a 0.014 inches standard Synchro micro guidewire, a 150 cm 021 Headway microcatheter was advanced to the supraclinoid left ICA. The micro guidewire was then gently manipulated into the left middle cerebral artery followed by the microcatheter and advanced without difficulty to the distal M2 M3 segment of the inferior division followed by the microcatheter. The guidewire was removed. Good aspiration obtained from the hub of the microcatheter. Gentle contrast injection demonstrated safe position of the tip of the microcatheter. The microcatheter was then exchanged for a 300 cm 014 inch Zoom exchange micro guidewire with a mild J configuration. The microcatheter was removed. Control arteriogram performed through the 5 [place] Catalyst guide catheter now demonstrated more clearly the  severe focal stenosis of the left middle cerebral artery proximal superior division. The 2.0 mm x 15 mm Resolute Onyx stent was then purged retrogradely with heparinized saline infusion, and 50% contrast and 50% heparinized saline infusion antegradely. It was then advanced using the rapid exchange technique, to the supraclinoid right ICA. The 5 [place] Catalyst guide catheter was advanced into the proximal left middle cerebral M1 segment. The balloon mounted stent was then advanced and positioned with the distal and proximal markers on the stent adequate distance from the site of the severe stenosis. Control inflation was then performed using a micro inflation syringe device via micro tubing to approximately 11.4 atmospheres to attain a diameter of approximately 2.02 mm where it was maintained for approximately 30 seconds. Balloon was then gently deflated and retrieved proximally. A control arteriogram performed through the Catalyst guide catheter demonstrated excellent apposition, with flow through the treated left middle cerebral artery superior and inferior divisions. Slow flow was noted an anterior perisylvian branch of the superior division which gradually improved with subsequent arteriograms at 15 and 30 minutes post stent deployment. A final control arteriogram performed through the 5 [place] Catalyst guide catheter now demonstrates significantly improved caliber and flow. Brisk flow in the anterior perisylvian branch of the superior division, which correspond to the area of infarct seen on the MRI examination of the brain. The 5 [place] Catalyst guide catheter, and the Infinity 6 [place] guide sheath were removed. The 8 French Pinnacle sheath in the right groin was removed with the  successful hemostasis with an 8 French Angio-Seal closure device. Distal pulses remained Dopplerable in both feet unchanged. A CT of the brain performed at the end of the procedure demonstrated no evidence of intracranial hemorrhage,  mass effect or midline shift. Patient was then extubated without difficulty. Upon recovery, the patient responded appropriately to simple instructions. She had mild pronation drift in the right upper extremity. However, speech was clear. She was transferred to PACU and then neuro ICU. The patient's overnight stay remained unremarkable. Her IV heparin was stopped the following morning. The patient was switched to aspirin 81 mg a day, and Brilinta 90 mg b.i.d. Patient was able to handle liquids and solids without difficulty. She was mobilized with PT and occupational therapy. Patient was reminded to continue taking her aspirin and her Brilinta on a regular basis and to maintain adequate hydration. Patient was advised to refrain from stooping, bending or lifting weights above 10 pounds for 2 weeks and not drive for a couple of weeks. Patient was transferred to the admitting service. 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 PLAN: Follow-up in clinic 2-4 weeks post discharge. Electronically Signed   By: Luanne Bras M.D.   On: 07/29/2020 15:48   IR 3D Independent Darreld Mclean  Result Date: 07/30/2020 CLINICAL DATA:  Intermittent right-sided numbness, tingling and weakness with expressive aphasia. Severe stenosis of the left middle cerebral artery on MRA of the head. EXAM: BILATERAL COMMON CAROTID AND INNOMINATE ANGIOGRAPHY; IR ULTRASOUND GUIDANCE VASC ACCESS RIGHT; WORKSTATION 3D RECONSTRUCTION; IR ANGIO VERTEBRAL SEL VERTEBRAL UNI RIGHT MOD SED COMPARISON:  CTA of the head and neck of 03/22/20. MEDICATIONS: Heparin 1,000 units IV. No antibiotic was administered within 1 hour of the procedure. ANESTHESIA/SEDATION: Versed 1 mg IV; Fentanyl 25 mcg IV Moderate Sedation Time:  44 minutes The patient was continuously monitored during the  procedure by the interventional radiology nurse under my direct supervision. CONTRAST:  75 mL Omnipaque 300 FLUOROSCOPY TIME:  Fluoroscopy Time: 9 minutes 6 seconds (1216 mGy). COMPLICATIONS: None immediate. TECHNIQUE: Informed written consent was obtained from the patient after a thorough discussion of the procedural risks, benefits and alternatives. All questions were addressed. Maximal Sterile Barrier Technique was utilized including caps, mask, sterile gowns, sterile gloves, sterile drape, hand hygiene and skin antiseptic. A timeout was performed prior to the initiation of the procedure. The right forearm to the wrist was prepped and draped in the usual sterile manner. The right radial artery was then identified with ultrasound and its morphology documented. A dorsal palmar anastomosis was verified to be present. Using ultrasound guidance, transradial access was obtained over a micropuncture set. Over a 0.018 inch guidewire, a 4/5 radial sheath was then inserted. The obturator, and micro guidewire were removed. Good aspiration obtained from the side port of the sheath. A cocktail of 200 mcg of nitroglycerin, 2000 units of heparin, and 2.5 mg of verapamil was then infused through the sheath and diluted form without event. A right radial arteriogram was obtained. Over a 0.035 inch Roadrunner guidewire, a Simmons 2 diagnostic catheter was then advanced through the aortic arch and selective cannulation was performed of the right vertebral artery, the right common carotid artery and the left common carotid artery. Following the procedure, hemostasis at the right radial puncture site was achieved with a wrist band. Distal right radial pulse was verified to be present. FINDINGS: Dominant right vertebral  origin is widely patent. The vessel is seen to opacify to the cranial skull base. Wide patency is seen of the right vertebrobasilar junction, and the right posterior-inferior cerebellar artery. A right posteroinferior  cerebellar artery/anterior inferior complex is seen. The basilar artery, the posterior cerebral arteries, the superior cerebellar arteries and the anterior-inferior cerebellar arteries opacify into the capillary and venous phases. Unopacified blood is seen in the basilar artery from the contralateral vertebral artery. The left common carotid arteriogram demonstrates the left external carotid artery and its major branches to be widely patent. The left internal carotid artery at the bulb has approximately 50% stenosis without evidence of ulcerations or of intraluminal filling defects. The vessel is seen to opacify to the cranial skull base. The petrous, cavernous and supraclinoid segments are widely patent. Left anterior cerebral artery opacifies into the capillary and venous phases. The left middle cerebral artery demonstrates severe high-grade stenosis at the origin of the dominant inferior division. This was verified on a 3D rotational arteriogram with subsequent reformation on a separate workstation. Right common carotid arteriogram demonstrates the right external carotid artery and its major branches to be widely patent. The right internal carotid artery at the bulb has minimal arteriosclerotic disease without evidence of ulcerations or of significant stenosis. The vessel is seen to opacify to the cranial skull base. The petrous, cavernous and supraclinoid segments are widely patent. The right middle cerebral artery demonstrates mild stenosis in its mid M1 segment. More distally the trifurcation branches opacify into the capillary and venous phases. The right anterior cerebral artery opacifies into the capillary and venous phases. 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 PLAN: Findings reviewed with patient and referring stroke MD. Option of endovascular revascularization versus aggressive medical management was reviewed in detail.  The procedure, alternatives, and potential complications of new ischemic event and/or hemorrhage of 1-2%, with potential for neurological deterioration, and death were reviewed with the patient, and her sister. Patient has decided to proceed with endovascular revascularization. Electronically Signed   By: Luanne Bras M.D.   On: 07/28/2020 09:37   IR CT Head Ltd  CLINICAL DATA:  Symptomatic left middle cerebral artery severe stenosis. Recurrent TIAs involving speech, and right-sided weakness and numbness despite being on antiplatelets.  EXAM: INTRACRANIAL STENT (INCL PTA)  COMPARISON:  Diagnostic arteriogram of July 25, 2020.  MEDICATIONS: Heparin 3,000 units IV. Vancomycin 1 g IV antibiotic was administered within 1 hour of the procedure.  ANESTHESIA/SEDATION: General anesthesia.  CONTRAST:  Isovue 300 approximately 90 mL.  FLUOROSCOPY TIME:  Fluoroscopy Time: 26 minutes 6 seconds (2592 mGy).  COMPLICATIONS: None immediate.  TECHNIQUE: Informed written consent was obtained from the patient after a thorough discussion of the procedural risks, benefits and alternatives. All questions were addressed. Maximal Sterile Barrier Technique was utilized including caps, mask, sterile gowns, sterile gloves, sterile drape, hand hygiene and skin antiseptic. A timeout was performed prior to the initiation of the procedure.  The right groin was prepped and draped in the usual sterile fashion. Thereafter using modified Seldinger technique, transfemoral access into the right common femoral artery was obtained without difficulty. Over a 0.035 inch guidewire, an 8 French 25 Pinnacle sheath was inserted. Through this, and also over 0.035 inch guidewire, a 5 [place] JB 1 catheter was advanced to the aortic arch region and selectively positioned left common carotid artery.  FINDINGS: The left common arteriogram demonstrates the left external carotid artery and its major branches  to be widely patent.  The left  internal carotid artery at the bulb has approximately 50% stenosis secondary to a smooth atherosclerotic plaque. The vessel opacifies to the cranial skull base. The petrous, cavernous and supraclinoid segments are widely patent.  The left middle cerebral artery again demonstrates a severe tapered stenosis of the distal left middle cerebral artery at the trifurcation extending into the origin of the dominant superior division of the left middle cerebral artery. Hemodynamically slow flow is seen in the left MCA distribution compared to the left anterior cerebral artery distribution. The left anterior cerebral artery opacifies into the capillary and venous phases.  ENDOVASCULAR REVASCULARIZATION OF THE SEVERE SYMPTOMATIC STENOSIS OF THE DISTAL LEFT MIDDLE CEREBRAL ARTERY M1 SEGMENT EXTENDING INTO THE DOMINANT SUPERIOR DIVISION.  The diagnostic JB 1 catheter in the left common carotid artery was then exchanged over a 0.035 inch 300 cm Rosen exchange guidewire for a 95 cm 6 [place] Infinity sheath. The guidewire was removed. Good aspiration obtained from the hub of the Infinity sheath. A gentle control arteriogram performed through the sheath demonstrated no change in the left common carotid artery bifurcation.  Over a 0.035 inch Roadrunner guidewire a 5 French 115 cm Catalyst guide catheter was advanced to the proximal cavernous segment of the left internal carotid artery. The guidewire was removed. Good aspiration obtained from the hub of the Catalyst guide catheter. Control arteriogram performed through this guide demonstrated no change in the left intracranial or cerebral circulation.  Magnified oblique views were then obtained. Measurements are performed of the left middle cerebral artery distal M1 segment, and the proximal inferior division.  It was decided to proceed with placement of a 2.0 mm x 15 mm Resolute Onyx balloon mounted stent.  Over a 0.014 inches standard Synchro micro guidewire, a 150 cm 021  Headway microcatheter was advanced to the supraclinoid left ICA.  The micro guidewire was then gently manipulated into the left middle cerebral artery followed by the microcatheter and advanced without difficulty to the distal M2 M3 segment of the inferior division followed by the microcatheter. The guidewire was removed. Good aspiration obtained from the hub of the microcatheter. Gentle contrast injection demonstrated safe position of the tip of the microcatheter. The microcatheter was then exchanged for a 300 cm 014 inch Zoom exchange micro guidewire with a mild J configuration.  The microcatheter was removed. Control arteriogram performed through the 5 [place] Catalyst guide catheter now demonstrated more clearly the severe focal stenosis of the left middle cerebral artery proximal superior division.  The 2.0 mm x 15 mm Resolute Onyx stent was then purged retrogradely with heparinized saline infusion, and 50% contrast and 50% heparinized saline infusion antegradely.  It was then advanced using the rapid exchange technique, to the supraclinoid right ICA.  The 5 [place] Catalyst guide catheter was advanced into the proximal left middle cerebral M1 segment.  The balloon mounted stent was then advanced and positioned with the distal and proximal markers on the stent adequate distance from the site of the severe stenosis.  Control inflation was then performed using a micro inflation syringe device via micro tubing to approximately 11.4 atmospheres to attain a diameter of approximately 2.02 mm where it was maintained for approximately 30 seconds. Balloon was then gently deflated and retrieved proximally. A control arteriogram performed through the Catalyst guide catheter demonstrated excellent apposition, with flow through the treated left middle cerebral artery superior and inferior divisions.  Slow flow was noted an anterior perisylvian branch of the  superior division which gradually improved with subsequent  arteriograms at 15 and 30 minutes post stent deployment.  A final control arteriogram performed through the 5 [place] Catalyst guide catheter now demonstrates significantly improved caliber and flow.  Brisk flow in the anterior perisylvian branch of the superior division, which correspond to the area of infarct seen on the MRI examination of the brain.  The 5 [place] Catalyst guide catheter, and the Infinity 6 [place] guide sheath were removed. The 8 French Pinnacle sheath in the right groin was removed with the successful hemostasis with an 8 French Angio-Seal closure device. Distal pulses remained Dopplerable in both feet unchanged.  A CT of the brain performed at the end of the procedure demonstrated no evidence of intracranial hemorrhage, mass effect or midline shift.  Patient was then extubated without difficulty. Upon recovery, the patient responded appropriately to simple instructions. She had mild pronation drift in the right upper extremity.  However, speech was clear.  She was transferred to PACU and then neuro ICU.  The patient's overnight stay remained unremarkable. Her IV heparin was stopped the following morning. The patient was switched to aspirin 81 mg a day, and Brilinta 90 mg b.i.d.  Patient was able to handle liquids and solids without difficulty. She was mobilized with PT and occupational therapy. Patient was reminded to continue taking her aspirin and her Brilinta on a regular basis and to maintain adequate hydration.  Patient was advised to refrain from stooping, bending or lifting weights above 10 pounds for 2 weeks and not drive for a couple of weeks. Patient was transferred to the admitting service.  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  PLAN: Follow-up in clinic 2-4 weeks post discharge.    Electronically Signed   By: Luanne Bras M.D.   On: 07/29/2020 15:48  IR US Guide Vasc Access Right  Result Date: 07/30/2020 CLINICAL DATA:  Intermittent right-sided numbness, tingling and weakness with expressive aphasia. Severe stenosis of the left middle cerebral artery on MRA of the head. EXAM: BILATERAL COMMON CAROTID AND INNOMINATE ANGIOGRAPHY; IR ULTRASOUND GUIDANCE VASC ACCESS RIGHT; WORKSTATION 3D RECONSTRUCTION; IR ANGIO VERTEBRAL SEL VERTEBRAL UNI RIGHT MOD SED COMPARISON:  CTA of the head and neck of 03/22/20. MEDICATIONS: Heparin 1,000 units IV. No antibiotic was administered within 1 hour of the procedure. ANESTHESIA/SEDATION: Versed 1 mg IV; Fentanyl 25 mcg IV Moderate Sedation Time:  44 minutes The patient was continuously monitored during the procedure by the interventional radiology nurse under my direct supervision. CONTRAST:  75 mL Omnipaque 300 FLUOROSCOPY TIME:  Fluoroscopy Time: 9 minutes 6 seconds (1216 mGy). COMPLICATIONS: None immediate. TECHNIQUE: Informed written consent was obtained from the patient after a thorough discussion of the procedural risks, benefits and alternatives. All questions were addressed. Maximal Sterile Barrier Technique was utilized including caps, mask, sterile gowns, sterile gloves, sterile drape, hand hygiene and skin antiseptic. A timeout was performed prior to the initiation of the procedure. The right forearm to the wrist was prepped and draped in the usual sterile manner. The right radial artery was then identified with ultrasound and its morphology documented. A dorsal palmar anastomosis was verified to be present. Using ultrasound guidance, transradial access was obtained over a micropuncture set. Over a 0.018 inch guidewire, a 4/5 radial sheath was then inserted. The obturator, and micro guidewire were removed. Good aspiration obtained from the side port  of the sheath. A cocktail of 200 mcg of nitroglycerin, 2000 units of heparin, and 2.5 mg of  verapamil was then infused through the sheath and diluted form without event. A right radial arteriogram was obtained. Over a 0.035 inch Roadrunner guidewire, a Simmons 2 diagnostic catheter was then advanced through the aortic arch and selective cannulation was performed of the right vertebral artery, the right common carotid artery and the left common carotid artery. Following the procedure, hemostasis at the right radial puncture site was achieved with a wrist band. Distal right radial pulse was verified to be present. FINDINGS: Dominant right vertebral origin is widely patent. The vessel is seen to opacify to the cranial skull base. Wide patency is seen of the right vertebrobasilar junction, and the right posterior-inferior cerebellar artery. A right posteroinferior cerebellar artery/anterior inferior complex is seen. The basilar artery, the posterior cerebral arteries, the superior cerebellar arteries and the anterior-inferior cerebellar arteries opacify into the capillary and venous phases. Unopacified blood is seen in the basilar artery from the contralateral vertebral artery. The left common carotid arteriogram demonstrates the left external carotid artery and its major branches to be widely patent. The left internal carotid artery at the bulb has approximately 50% stenosis without evidence of ulcerations or of intraluminal filling defects. The vessel is seen to opacify to the cranial skull base. The petrous, cavernous and supraclinoid segments are widely patent. Left anterior cerebral artery opacifies into the capillary and venous phases. The left middle cerebral artery demonstrates severe high-grade stenosis at the origin of the dominant inferior division. This was verified on a 3D rotational arteriogram with subsequent reformation on a separate workstation. Right common carotid arteriogram demonstrates the right external carotid artery and its major branches to be widely patent. The right internal carotid  artery at the bulb has minimal arteriosclerotic disease without evidence of ulcerations or of significant stenosis. The vessel is seen to opacify to the cranial skull base. The petrous, cavernous and supraclinoid segments are widely patent. The right middle cerebral artery demonstrates mild stenosis in its mid M1 segment. More distally the trifurcation branches opacify into the capillary and venous phases. The right anterior cerebral artery opacifies into the capillary and venous phases. 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 PLAN: Findings reviewed with patient and referring stroke MD. Option of endovascular revascularization versus aggressive medical management was reviewed in detail. The procedure, alternatives, and potential complications of new ischemic event and/or hemorrhage of 1-2%, with potential for neurological deterioration, and death were reviewed with the patient, and her sister. Patient has decided to proceed with endovascular revascularization. Electronically Signed   By: Luanne Bras M.D.   On: 07/28/2020 09:37   DG CHEST PORT 1 VIEW  Result Date: 07/28/2020 CLINICAL DATA:  New right IJ CVC EXAM: PORTABLE CHEST 1 VIEW COMPARISON:  March 21, 2020. FINDINGS: Right internal jugular central venous catheter with tip overlying the SVC. The heart size and mediastinal contours are within normal limits. No focal consolidation. No pleural effusion. No visible pneumothorax. Elevation the right hemidiaphragm. The visualized skeletal structures are unremarkable. IMPRESSION: Right internal jugular central venous catheter with tip overlying the SVC. No visible pneumothorax. Electronically Signed   By: Dahlia Bailiff MD   On: 07/28/2020 15:55   VAS [place] ABI WITH/WO TBI  Result Date: 07/24/2020 LOWER EXTREMITY DOPPLER STUDY Indications: LLE "cool" - BLE warm to touch upon examination. High Risk Factors: Hypertension,  hyperlipidemia, Diabetes, no  history of                    smoking, prior CVA.  Comparison Study: No previous exam Performing Technologist: Hill, Jody RVT, RDMS  Examination Guidelines: A complete evaluation includes at minimum, Doppler waveform signals and systolic blood pressure reading at the level of bilateral brachial, anterior tibial, and posterior tibial arteries, when vessel segments are accessible. Bilateral testing is considered an integral part of a complete examination. Photoelectric Plethysmograph (PPG) waveforms and toe systolic pressure readings are included as required and additional duplex testing as needed. Limited examinations for reoccurring indications may be performed as noted.  ABI Findings: +---------+------------------+-----+---------+---------------+ Right    Rt Pressure (mmHg)IndexWaveform Comment         +---------+------------------+-----+---------+---------------+ Brachial 145                    triphasic                +---------+------------------+-----+---------+---------------+ PTA                             biphasic noncompressible +---------+------------------+-----+---------+---------------+ DP       232               1.60 triphasic                +---------+------------------+-----+---------+---------------+ Marchelle Gearing               1.06 Normal                   +---------+------------------+-----+---------+---------------+ +---------+------------------+-----+---------+---------------------------------+ Left     Lt Pressure (mmHg)IndexWaveform Comment                           +---------+------------------+-----+---------+---------------------------------+ Brachial 143                    biphasic                                   +---------+------------------+-----+---------+---------------------------------+ PTA                             triphasicnoncompressible                    +---------+------------------+-----+---------+---------------------------------+ DP       226               1.56 triphasic                                  +---------+------------------+-----+---------+---------------------------------+ Great Toe101               0.70 Normal   borderline normal to mild small                                            vessel disease                    +---------+------------------+-----+---------+---------------------------------+  Summary: Right: Resting right ankle-brachial index indicates noncompressible right lower extremity arteries. The right toe-brachial index is normal. Left: Resting left ankle-brachial index indicates noncompressible left lower extremity arteries.  The left toe-brachial index is normal.  *See table(s) above for measurements and observations.  Electronically signed by Harold Barban MD on 07/24/2020 at 9:28:50 PM.   Final    ECHOCARDIOGRAM COMPLETE  Result Date: 07/24/2020    ECHOCARDIOGRAM REPORT   Patient Name:   ALARA BUCKLEY Date of Exam: 07/24/2020 Medical Rec #:  [id number]     Height:       67.0 in Accession #:    [id number]    Weight:       250.0 lb Date of Birth:  December 12, 1965     BSA:          2.223 m Patient Age:    67 years      BP:           158/89 mmHg Patient Gender: F             HR:           93 bpm. Exam Location:  Inpatient Procedure: 2D Echo, Cardiac Doppler and Color Doppler Indications:    CVA  History:        Patient has prior history of Echocardiogram examinations, most                 recent 03/23/2020. Stroke; Risk Factors:Hypertension and                 Diabetes.  Sonographer:    Luisa Hart RDCS Referring Phys: [id number] Courtney Paris  Sonographer Comments: Patient is morbidly obese. No cardiac surgeries noted on chart IMPRESSIONS  1. Abnormal septal motion . Left ventricular ejection fraction, by estimation, is 55 to 60%. The left ventricle has normal function. The left ventricle has no regional wall motion  abnormalities. There is mild left ventricular hypertrophy. Left ventricular diastolic parameters were normal.  2. Right ventricular systolic function is normal. The right ventricular size is normal. There is normal pulmonary artery systolic pressure.  3. The mitral valve is abnormal. No evidence of mitral valve regurgitation. No evidence of mitral stenosis.  4. The aortic valve is tricuspid. Aortic valve regurgitation is not visualized. Mild to moderate aortic valve sclerosis/calcification is present, without any evidence of aortic stenosis.  5. The inferior vena cava is normal in size with greater than 50% respiratory variability, suggesting right atrial pressure of 3 mmHg. FINDINGS  Left Ventricle: Abnormal septal motion. Left ventricular ejection fraction, by estimation, is 55 to 60%. The left ventricle has normal function. The left ventricle has no regional wall motion abnormalities. The left ventricular internal cavity size was normal in size. There is mild left ventricular hypertrophy. Left ventricular diastolic parameters were normal. Right Ventricle: The right ventricular size is normal. No increase in right ventricular wall thickness. Right ventricular systolic function is normal. There is normal pulmonary artery systolic pressure. The tricuspid regurgitant velocity is 2.48 m/s, and  with an assumed right atrial pressure of 3 mmHg, the estimated right ventricular systolic pressure is 0000000 mmHg. Left Atrium: Left atrial size was normal in size. Right Atrium: Right atrial size was normal in size. Pericardium: There is no evidence of pericardial effusion. Mitral Valve: The mitral valve is abnormal. There is moderate thickening of the mitral valve leaflet(s). There is moderate calcification of the mitral valve leaflet(s). Mild mitral annular calcification. No evidence of mitral valve regurgitation. No evidence of mitral valve stenosis. Tricuspid Valve: The tricuspid valve is normal in structure. Tricuspid valve  regurgitation is trivial. No evidence of tricuspid stenosis. Aortic Valve: The aortic valve  is tricuspid. Aortic valve regurgitation is not visualized. Mild to moderate aortic valve sclerosis/calcification is present, without any evidence of aortic stenosis. Aortic valve mean gradient measures 4.0 mmHg. Aortic valve peak gradient measures 8.9 mmHg. Pulmonic Valve: The pulmonic valve was normal in structure. Pulmonic valve regurgitation is trivial. No evidence of pulmonic stenosis. Aorta: The aortic root is normal in size and structure. Venous: The inferior vena cava is normal in size with greater than 50% respiratory variability, suggesting right atrial pressure of 3 mmHg. IAS/Shunts: The interatrial septum was not well visualized.  LEFT VENTRICLE PLAX 2D LVIDd:         3.20 cm     Diastology LVIDs:         2.00 cm     LV e' medial:    5.66 cm/s LV PW:         1.40 cm     LV E/e' medial:  13.9 LV IVS:        1.25 cm     LV e' lateral:   7.72 cm/s                            LV E/e' lateral: 10.2  LV Volumes (MOD) LV vol d, MOD A2C: 51.1 ml LV vol d, MOD A4C: 33.4 ml LV vol s, MOD A2C: 27.4 ml LV vol s, MOD A4C: 15.2 ml LV SV MOD A2C:     23.7 ml LV SV MOD A4C:     33.4 ml LV SV MOD BP:      22.0 ml RIGHT VENTRICLE RV S prime:     11.10 cm/s TAPSE (M-mode): 1.6 cm LEFT ATRIUM             Index       RIGHT ATRIUM          Index LA diam:        2.90 cm 1.30 cm/m  RA Area:     7.14 cm LA Vol (A2C):   25.9 ml 11.65 ml/m RA Volume:   10.20 ml 4.59 ml/m LA Vol (A4C):   32.8 ml 14.75 ml/m LA Biplane Vol: 29.5 ml 13.27 ml/m  AORTIC VALVE                   PULMONIC VALVE AV Vmax:           149.00 cm/s PV Vmax:       0.87 m/s AV Vmean:          95.800 cm/s PV Vmean:      71.200 cm/s AV VTI:            0.238 m     PV VTI:        0.199 m AV Peak Grad:      8.9 mmHg    PV Peak grad:  3.0 mmHg AV Mean Grad:      4.0 mmHg    PV Mean grad:  2.0 mmHg LVOT Vmax:         93.30 cm/s LVOT Vmean:        55.700 cm/s LVOT VTI:           0.147 m LVOT/AV VTI ratio: 0.62  AORTA Ao Root diam: 3.40 cm Ao Asc diam:  3.20 cm MITRAL VALVE                TRICUSPID VALVE MV Area (PHT): 3.83 cm     TR Peak  grad:   24.6 mmHg MV Decel Time: 198 msec     TR Vmax:        248.00 cm/s MV E velocity: 78.80 cm/s MV A velocity: 108.00 cm/s  SHUNTS MV E/A ratio:  0.73         Systemic VTI: 0.15 m Jenkins Rouge MD Electronically signed by Jenkins Rouge MD Signature Date/Time: 07/24/2020/11:57:51 AM    Final    VAS US CAROTID (at [facility] and [place] only)  Result Date: 07/24/2020 Carotid Arterial Duplex Study Indications:       CVA and Speech disturbance. Risk Factors:      Hypertension, hyperlipidemia, Diabetes, no history of                    smoking, prior CVA. Limitations        Today's exam was limited due to the body habitus of the                    patient. Comparison Study:  No previous exams. CTA performed 03/22/20. Performing Technologist: Rogelia Rohrer  Examination Guidelines: A complete evaluation includes B-mode imaging, spectral Doppler, color Doppler, and power Doppler as needed of all accessible portions of each vessel. Bilateral testing is considered an integral part of a complete examination. Limited examinations for reoccurring indications may be performed as noted.  Right Carotid Findings: +----------+--------+--------+--------+------------------+------------------+           PSV cm/sEDV cm/sStenosisPlaque DescriptionComments           +----------+--------+--------+--------+------------------+------------------+ CCA Prox  91      18                                                   +----------+--------+--------+--------+------------------+------------------+ CCA Distal62      17                                intimal thickening +----------+--------+--------+--------+------------------+------------------+ ICA Prox  80      30                                                    +----------+--------+--------+--------+------------------+------------------+ ICA Distal57      22                                                   +----------+--------+--------+--------+------------------+------------------+ ECA       92      14                                                   +----------+--------+--------+--------+------------------+------------------+ +----------+--------+-------+----------------+-------------------+           PSV cm/sEDV cmsDescribe        Arm Pressure (mmHG) +----------+--------+-------+----------------+-------------------+ [id number]             Multiphasic, WNL                    +----------+--------+-------+----------------+-------------------+ +---------+--------+--+--------+--+---------+  VertebralPSV cm/s41EDV cm/s12Antegrade +---------+--------+--+--------+--+---------+  Left Carotid Findings: +----------+--------+--------+--------+------------------+------------------+           PSV cm/sEDV cm/sStenosisPlaque DescriptionComments           +----------+--------+--------+--------+------------------+------------------+ CCA Prox  77      14                                intimal thickening +----------+--------+--------+--------+------------------+------------------+ CCA Distal68      14                                intimal thickening +----------+--------+--------+--------+------------------+------------------+ ICA Prox  70      27                                                   +----------+--------+--------+--------+------------------+------------------+ ICA Distal81      33                                                   +----------+--------+--------+--------+------------------+------------------+ ECA       76      11                                                   +----------+--------+--------+--------+------------------+------------------+  +----------+--------+--------+----------------+-------------------+           PSV cm/sEDV cm/sDescribe        Arm Pressure (mmHG) +----------+--------+--------+----------------+-------------------+ [id number]              Multiphasic, WNL                    +----------+--------+--------+----------------+-------------------+ +---------+--------+--+--------+--+---------+ VertebralPSV cm/s58EDV cm/s14Antegrade +---------+--------+--+--------+--+---------+   Summary: Right Carotid: The extracranial vessels were near-normal with only minimal wall                thickening or plaque. Left Carotid: The extracranial vessels were near-normal with only minimal wall               thickening or plaque. Vertebrals:  Bilateral vertebral arteries demonstrate antegrade flow. Subclavians: Normal flow hemodynamics were seen in bilateral subclavian              arteries. *See table(s) above for measurements and observations.  Electronically signed by Harold Barban MD on 07/24/2020 at 9:28:20 PM.    Final    IR ANGIO INTRA EXTRACRAN SEL COM CAROTID INNOMINATE BILAT MOD SED  Result Date: 07/30/2020 CLINICAL DATA:  Intermittent right-sided numbness, tingling and weakness with expressive aphasia. Severe stenosis of the left middle cerebral artery on MRA of the head. EXAM: BILATERAL COMMON CAROTID AND INNOMINATE ANGIOGRAPHY; IR ULTRASOUND GUIDANCE VASC ACCESS RIGHT; WORKSTATION 3D RECONSTRUCTION; IR ANGIO VERTEBRAL SEL VERTEBRAL UNI RIGHT MOD SED COMPARISON:  CTA of the head and neck of 03/22/20. MEDICATIONS: Heparin 1,000 units IV. No antibiotic was administered within 1 hour of the procedure. ANESTHESIA/SEDATION: Versed 1 mg IV; Fentanyl 25 mcg IV Moderate Sedation Time:  44 minutes The patient was continuously monitored during  the procedure by the interventional radiology nurse under my direct supervision. CONTRAST:  75 mL Omnipaque 300 FLUOROSCOPY TIME:  Fluoroscopy Time: 9 minutes 6 seconds (1216 mGy).  COMPLICATIONS: None immediate. TECHNIQUE: Informed written consent was obtained from the patient after a thorough discussion of the procedural risks, benefits and alternatives. All questions were addressed. Maximal Sterile Barrier Technique was utilized including caps, mask, sterile gowns, sterile gloves, sterile drape, hand hygiene and skin antiseptic. A timeout was performed prior to the initiation of the procedure. The right forearm to the wrist was prepped and draped in the usual sterile manner. The right radial artery was then identified with ultrasound and its morphology documented. A dorsal palmar anastomosis was verified to be present. Using ultrasound guidance, transradial access was obtained over a micropuncture set. Over a 0.018 inch guidewire, a 4/5 radial sheath was then inserted. The obturator, and micro guidewire were removed. Good aspiration obtained from the side port of the sheath. A cocktail of 200 mcg of nitroglycerin, 2000 units of heparin, and 2.5 mg of verapamil was then infused through the sheath and diluted form without event. A right radial arteriogram was obtained. Over a 0.035 inch Roadrunner guidewire, a Simmons 2 diagnostic catheter was then advanced through the aortic arch and selective cannulation was performed of the right vertebral artery, the right common carotid artery and the left common carotid artery. Following the procedure, hemostasis at the right radial puncture site was achieved with a wrist band. Distal right radial pulse was verified to be present. FINDINGS: Dominant right vertebral origin is widely patent. The vessel is seen to opacify to the cranial skull base. Wide patency is seen of the right vertebrobasilar junction, and the right posterior-inferior cerebellar artery. A right posteroinferior cerebellar artery/anterior inferior complex is seen. The basilar artery, the posterior cerebral arteries, the superior cerebellar arteries and the anterior-inferior cerebellar  arteries opacify into the capillary and venous phases. Unopacified blood is seen in the basilar artery from the contralateral vertebral artery. The left common carotid arteriogram demonstrates the left external carotid artery and its major branches to be widely patent. The left internal carotid artery at the bulb has approximately 50% stenosis without evidence of ulcerations or of intraluminal filling defects. The vessel is seen to opacify to the cranial skull base. The petrous, cavernous and supraclinoid segments are widely patent. Left anterior cerebral artery opacifies into the capillary and venous phases. The left middle cerebral artery demonstrates severe high-grade stenosis at the origin of the dominant inferior division. This was verified on a 3D rotational arteriogram with subsequent reformation on a separate workstation. Right common carotid arteriogram demonstrates the right external carotid artery and its major branches to be widely patent. The right internal carotid artery at the bulb has minimal arteriosclerotic disease without evidence of ulcerations or of significant stenosis. The vessel is seen to opacify to the cranial skull base. The petrous, cavernous and supraclinoid segments are widely patent. The right middle cerebral artery demonstrates mild stenosis in its mid M1 segment. More distally the trifurcation branches opacify into the capillary and venous phases. The right anterior cerebral artery opacifies into the capillary and venous phases. 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 PLAN: Findings reviewed with patient and referring stroke MD. Option of endovascular revascularization versus aggressive medical management was reviewed in detail. The procedure, alternatives, and potential complications of new ischemic event and/or hemorrhage of 1-2%, with potential for neurological deterioration, and death were  reviewed  with the patient, and her sister. Patient has decided to proceed with endovascular revascularization. Electronically Signed   By: Luanne Bras M.D.   On: 07/28/2020 09:37   IR ANGIO VERTEBRAL SEL VERTEBRAL UNI R MOD SED  Result Date: 07/30/2020 CLINICAL DATA:  Intermittent right-sided numbness, tingling and weakness with expressive aphasia. Severe stenosis of the left middle cerebral artery on MRA of the head. EXAM: BILATERAL COMMON CAROTID AND INNOMINATE ANGIOGRAPHY; IR ULTRASOUND GUIDANCE VASC ACCESS RIGHT; WORKSTATION 3D RECONSTRUCTION; IR ANGIO VERTEBRAL SEL VERTEBRAL UNI RIGHT MOD SED COMPARISON:  CTA of the head and neck of 03/22/20. MEDICATIONS: Heparin 1,000 units IV. No antibiotic was administered within 1 hour of the procedure. ANESTHESIA/SEDATION: Versed 1 mg IV; Fentanyl 25 mcg IV Moderate Sedation Time:  44 minutes The patient was continuously monitored during the procedure by the interventional radiology nurse under my direct supervision. CONTRAST:  75 mL Omnipaque 300 FLUOROSCOPY TIME:  Fluoroscopy Time: 9 minutes 6 seconds (1216 mGy). COMPLICATIONS: None immediate. TECHNIQUE: Informed written consent was obtained from the patient after a thorough discussion of the procedural risks, benefits and alternatives. All questions were addressed. Maximal Sterile Barrier Technique was utilized including caps, mask, sterile gowns, sterile gloves, sterile drape, hand hygiene and skin antiseptic. A timeout was performed prior to the initiation of the procedure. The right forearm to the wrist was prepped and draped in the usual sterile manner. The right radial artery was then identified with ultrasound and its morphology documented. A dorsal palmar anastomosis was verified to be present. Using ultrasound guidance, transradial access was obtained over a micropuncture set. Over a 0.018 inch guidewire, a 4/5 radial sheath was then inserted. The obturator, and micro guidewire were removed. Good aspiration  obtained from the side port of the sheath. A cocktail of 200 mcg of nitroglycerin, 2000 units of heparin, and 2.5 mg of verapamil was then infused through the sheath and diluted form without event. A right radial arteriogram was obtained. Over a 0.035 inch Roadrunner guidewire, a Simmons 2 diagnostic catheter was then advanced through the aortic arch and selective cannulation was performed of the right vertebral artery, the right common carotid artery and the left common carotid artery. Following the procedure, hemostasis at the right radial puncture site was achieved with a wrist band. Distal right radial pulse was verified to be present. FINDINGS: Dominant right vertebral origin is widely patent. The vessel is seen to opacify to the cranial skull base. Wide patency is seen of the right vertebrobasilar junction, and the right posterior-inferior cerebellar artery. A right posteroinferior cerebellar artery/anterior inferior complex is seen. The basilar artery, the posterior cerebral arteries, the superior cerebellar arteries and the anterior-inferior cerebellar arteries opacify into the capillary and venous phases. Unopacified blood is seen in the basilar artery from the contralateral vertebral artery. The left common carotid arteriogram demonstrates the left external carotid artery and its major branches to be widely patent. The left internal carotid artery at the bulb has approximately 50% stenosis without evidence of ulcerations or of intraluminal filling defects. The vessel is seen to opacify to the cranial skull base. The petrous, cavernous and supraclinoid segments are widely patent. Left anterior cerebral artery opacifies into the capillary and venous phases. The left middle cerebral artery demonstrates severe high-grade stenosis at the origin of the dominant inferior division. This was verified on a 3D rotational arteriogram with subsequent reformation on a separate workstation. Right common carotid  arteriogram demonstrates the right external carotid artery and its major branches to  be widely patent. The right internal carotid artery at the bulb has minimal arteriosclerotic disease without evidence of ulcerations or of significant stenosis. The vessel is seen to opacify to the cranial skull base. The petrous, cavernous and supraclinoid segments are widely patent. The right middle cerebral artery demonstrates mild stenosis in its mid M1 segment. More distally the trifurcation branches opacify into the capillary and venous phases. The right anterior cerebral artery opacifies into the capillary and venous phases. 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 PLAN: Findings reviewed with patient and referring stroke MD. Option of endovascular revascularization versus aggressive medical management was reviewed in detail. The procedure, alternatives, and potential complications of new ischemic event and/or hemorrhage of 1-2%, with potential for neurological deterioration, and death were reviewed with the patient, and her sister. Patient has decided to proceed with endovascular revascularization. Electronically Signed   By: Luanne Bras M.D.   On: 07/28/2020 09:37    PHYSICAL EXAM  Temp:  [98.4 F (36.9 C)-98.9 F (37.2 C)] 98.5 F (36.9 C) (04/27 1200) Pulse Rate:  [85-106] 93 (04/27 1400) Resp:  [0-28] 18 (04/27 1400) BP: (117-162)/(60-100) 143/75 (04/27 1400) SpO2:  [94 %-100 %] 94 % (04/27 1400)  General -morbidly [name],  , in no apparent distress. Cardiovascular - Regular rhythm and rate.  Mental Status -  Level of arousal and orientation to time, place, and person were intact. Language including expression, naming, repetition, comprehension was assessed and found intact.  Cranial Nerves II - XII - II - Visual field intact OU. III, IV, VI - Extraocular movements intact. V -  Facial sensation intact bilaterally. VII - Facial movement intact bilaterally. VIII - Hearing & vestibular intact bilaterally. X - Palate elevates symmetrically. XI - Chin turning & shoulder shrug intact bilaterally. XII - Tongue protrusion intact.  Motor Strength - The patient's strength was normal in all extremities and pronator drift was absent.  Bulk was normal and fasciculations were absent.   Motor Tone - normal   Sensory - Light touch, temperature/pinprick were assessed and were symmetrical.    Coordination - The patient had normal movements in the hands with no ataxia or dysmetria.  Tremor was absent.  Gait and Station - deferred.   ASSESSMENT/PLAN Ms. Sharon Blankenship is a 55 y.o. 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 No tPA given due to outside window.    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  LDL 61  HgbA1c 7.7  P2Y12 25  Lovenox for VTE prophylaxis  aspirin 81 mg daily prior to admission, now on aspirin 81 mg daily and Brilinta (ticagrelor) 90 mg bid.  Patient counseled to be compliant with her antithrombotic medications  Ongoing aggressive stroke risk factor management  Therapy recommendations: OT: no followup needs; Home health PT  Disposition: Pending  History of stroke  03/2020 admitted for right upper extremity numbness weakness, slurred speech and difficulty walking.  CT left frontal infarct.  MRI showed left MCA territory, CR, MCA/ACA and MCA/PCA watershed area infarcts.  MRI showed severe left M2 stenosis.  CTA head and  neck left ICA atherosclerosis with plaque, left M2  severe stenosis.  LDL 159, A1c 6.6.  EF 55 to 60%.  Discharged on DAPT and Lipitor.  Open angle glaucoma  Was on Diamox 250 TID  plan for eye surgery 08/12/2020  Diamox (although IV more than po) can cause " reverse Robinhood phenomena" that can worsen left MCA perfusion, current off meds, recommend to hold if OK with ophthalmology until MCA stenting done  Diamox was currently on hold, okay to continue after MCA stenting  Diabetes  HgbA1c 7.7 goal < 7.0  Uncontrolled  Currently on Lantus  CBG monitoring  SSI  DM education and close PCP follow up  Hypertension . Stable . On Cozaar 100mg  daily . Avoid low BP  Long term BP goal 130-150 given left M2 high-grade stenosis.  Long-term BP goal normotensive after MCA stenting.  Hyperlipidemia  Home meds: Lipitor 20  LDL 61, goal < 70  Now on Lipitor 40  Continue statin at discharge  Other Stroke Risk Factors  Obesity, Body mass index is 39.16 kg/m.    Hospital day # 7 Patient continues to do well.    continue dual antiplatelet therapy for 3 to 6 months.   Discharge home.  Follow-up as an outpatient stroke clinic in 6 weeks.  Discussed with neuro interventional team and Dr. Shon Baton. .  Long discussion with patient and sister and answered questions.  Greater than 50% time during this 25-minute visit was spent on counseling and coordination of care about angioplasties and stenting and discussion about stroke prevention and answering questions.  Antony Contras, MD Medical Director [place] Pager: [phone] 07/30/2020 3:38 PM       To contact Stroke Continuity provider, please refer to [email or web address] After hours, contact General Neurology

## 2020-07-30 NOTE — TOC Transition Note (Signed)
Transition of Care [facility]) - CM/SW Discharge Note   Patient Details  Name: Sharon Blankenship MRN: [id number] Date of Birth: 1965-06-25  Transition of Care [facility]) CM/SW Contact:  Ella Bodo, RN Phone Number: 07/30/2020, 2:25 PM   Clinical Narrative:  55 y.o. female presents to [facility] ED on 07/23/2020 with reports of word finding difficulties and headache. 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 MRI on 4/20 demonstrates Several acute infarctions in the left hemisphere extending from the posterior frontal cortical and subcortical brain to the parieto-occipital cortical and subcortical brain.S/p revascularization of L MCA 4/25.   Prior to admission, patient independent of ADLs; lives at home alone.  She states she plans to stay with her sister here in [place] for a couple of days before returning home.  PT/OT recommending home health follow-up with intermittent assistance at discharge.  Patient is uninsured, but is eligible for charity home health services through [place].  Rolling walker ordered for home from [place], to be delivered to bedside prior to discharge.  Discharge prescriptions sent to [place] [facility] to be filled using [facility] letter, as patient is uninsured.  Medications will be delivered to bedside prior to discharge.   Final next level of care: [place] Barriers to Discharge: Barriers Resolved   Patient Goals and CMS Choice Patient states their goals for this hospitalization and ongoing recovery are:: to go home CMS Medicare.gov Compare Post Acute Care list provided to:: Patient Choice offered to / list presented to : Patient                      Discharge Plan and Services   Discharge Planning Services: CM Consult Post Acute Care Choice: Home Health          DME Arranged: Walker rolling DME Agency: [employer] Date DME Agency Contacted: 07/30/20   Representative spoke with at DME  Agency: [place]: PT,OT [place] Agency: [place] Date [place]: 07/30/20 Time [place]: [id number] Representative spoke with at [place]: [place] ([place]) Interventions     Readmission Risk Interventions No flowsheet data found.  Reinaldo Raddle, RN, BSN  Trauma/Neuro ICU Case Manager [phone]

## 2020-07-30 NOTE — Progress Notes (Signed)
Physical Therapy Treatment Patient Details Name: Sharon Blankenship MRN: [id number] DOB: [birth year] Today's Date: 07/30/2020    History of Present Illness 55 y.o. female presents to [facility] ED on 07/23/2020 with reports of word finding difficulties and headache. 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 MRI on 4/20 demonstrates Several acute infarctions in the left hemisphere extending from the posterior frontal cortical and subcortical brain to the parieto-occipital cortical and subcortical brain.S/p revascularization of L MCA 4/25.  PMH includes several acute infarctions in the left hemisphere extending from the posterior frontal cortical and subcortical brain to the parieto-occipital cortical and subcortical brain.    PT Comments    Pt with improved stability and activity tolerance in comparison to yesterday. Ambulating x 150 feet with walker vs cane vs no AD. Pt requiring decreased assist with use of assistive device and demonstrates increased gait speed with use of walker. Recommended continued use of walker initially for safety. Pt continues to need verbal cues for environmental negotiation and occasionally bumping into obstacles in close spaces. D/c plan remains appropriate.    Follow Up Recommendations  Home health PT;Supervision for mobility/OOB (OPPT neuro if does not receive HHPT)     Equipment Recommendations  None recommended by PT    Recommendations for Other Services       Precautions / Restrictions Precautions Precautions: Fall Precaution Comments: visual deficits Restrictions Weight Bearing Restrictions: No    Mobility  Bed Mobility Overal bed mobility: Modified Independent             General bed mobility comments: Increased time/effort    Transfers Overall transfer level: Needs assistance Equipment used: None Transfers: Sit to/from Stand Sit to Stand: Min guard         General transfer comment: Min guard for safety,  cues for reaching for grab bar in bathroom  Ambulation/Gait Ambulation/Gait assistance: Min guard Gait Distance (Feet): 150 Feet Assistive device: Rolling walker (2 wheeled);Straight cane;None Gait Pattern/deviations: Step-through pattern;Decreased stride length;Decreased dorsiflexion - right Gait velocity: Decreased Gait velocity interpretation: <1.8 ft/sec, indicate of risk for recurrent falls General Gait Details: Pt requiring cues for sequencing/environmental negotiation due to visual deficits (baseline). Pt requiring min guard assist with cane and walker, up to minA with no AD and reaching for external support. Noted decreased gait speed with cane vs walker and more hesitant   Stairs             Wheelchair Mobility    Modified Rankin (Stroke Patients Only) Modified Rankin (Stroke Patients Only) Pre-Morbid Rankin Score: No significant disability Modified Rankin: Moderately severe disability     Balance Overall balance assessment: Needs assistance Sitting-balance support: Feet supported Sitting balance-Leahy Scale: Fair     Standing balance support: No upper extremity supported Standing balance-Leahy Scale: Poor Standing balance comment: Requiring at least single UE support at sink                            Cognition Arousal/Alertness: Awake/alert Behavior During Therapy: Flat affect Overall Cognitive Status: Impaired/Different from baseline Area of Impairment: Safety/judgement;Awareness;Problem solving                         Safety/Judgement: Decreased awareness of safety;Decreased awareness of deficits Awareness: Emergent Problem Solving: Slow processing General Comments: Attributes right sided deficits to other factors. Pt stating, 'If I'm more wobbly today, then it's because my vision is worse."  Pt endorses her vision waxes and wanes since initial stroke when questioned. Pt with mildly slower processing speed, no impulsivity today.       Exercises      General Comments        Pertinent Vitals/Pain Pain Assessment: No/denies pain    Home Living                      Prior Function            PT Goals (current goals can now be found in the care plan section) Acute Rehab PT Goals Patient Stated Goal: Be able to manage without the walker PT Goal Formulation: With patient Time For Goal Achievement: 08/08/20 Potential to Achieve Goals: Good Progress towards PT goals: Progressing toward goals    Frequency    Min 4X/week      PT Plan Current plan remains appropriate    Co-evaluation              AM-PAC PT "6 Clicks" Mobility   Outcome Measure  Help needed turning from your back to your side while in a flat bed without using bedrails?: None Help needed moving from lying on your back to sitting on the side of a flat bed without using bedrails?: None Help needed moving to and from a bed to a chair (including a wheelchair)?: A Little Help needed standing up from a chair using your arms (e.g., wheelchair or bedside chair)?: A Little Help needed to walk in hospital room?: A Little Help needed climbing 3-5 steps with a railing? : A Little 6 Click Score: 20    End of Session Equipment Utilized During Treatment: Gait belt Activity Tolerance: Patient tolerated treatment well Patient left: in bed;with call bell/phone within reach Nurse Communication: Mobility status PT Visit Diagnosis: Unsteadiness on feet (R26.81);Other abnormalities of gait and mobility (R26.89);Other symptoms and signs involving the nervous system (R29.898)     Time: 1040-1104 PT Time Calculation (min) (ACUTE ONLY): 24 min  Charges:  $Therapeutic Activity: 23-37 mins                     Sharon Blankenship, PT, DPT Acute Rehabilitation Services Pager [phone] Office [phone]    Sharon Blankenship 07/30/2020, 11:14 AM

## 2020-07-30 NOTE — Progress Notes (Incomplete)
HD#7 Subjective:   No acute events overnight.  During evaluation this morning, patient states ***  Objective:   Vital signs in last 24 hours: Vitals:   07/30/20 0200 07/30/20 0300 07/30/20 0400 07/30/20 0500  BP: 128/68  129/66 129/60  Pulse: 99 (!) 106 100   Resp: (!) 0 17 18   Temp:   98.4 F (36.9 C)   TempSrc:   Oral   SpO2: 98% 99% 99%   Weight:      Height:       Physical Exam Constitutional: tired-appearing woman lying in bed, awake and alert, in no acute distress, appears anxious HENT: normocephalic atraumatic, mucous membranes moist Cardiovascular: regular rate and rhythm, no m/r/g, R femoral puncture site soft without bleeding or hematoma, R IJ central line site without bleeding or hematoma Pulmonary/Chest: normal work of breathing on room air Neurological: alert & oriented to person, place, time, and situation. 5/5 grip strength.    Psych: anxious affect  Pertinent Labs: CBC Latest Ref Rng & Units 07/29/2020 07/28/2020 07/27/2020  WBC 4.0 - 10.5 K/uL 11.0(H) 7.1 7.2  Hemoglobin 12.0 - 15.0 g/dL 8.9(L) 9.4(L) 9.4(L)  Hematocrit 36.0 - 46.0 % 27.5(L) 29.1(L) 29.2(L)  Platelets 150 - 400 K/uL 292 273 258    CMP Latest Ref Rng & Units 07/30/2020 07/29/2020 07/28/2020  Glucose 70 - 99 mg/dL 219(H) 384(H) 202(H)  BUN 6 - 20 mg/dL 17 18 12   Creatinine 0.44 - 1.00 mg/dL 1.33(H) 1.45(H) 1.03(H)  Sodium 135 - 145 mmol/L 141 135 141  Potassium 3.5 - 5.1 mmol/L 3.6 4.4 3.7  Chloride 98 - 111 mmol/L 115(H) 112(H) 114(H)  CO2 22 - 32 mmol/L 16(L) 12(L) 20(L)  Calcium 8.9 - 10.3 mg/dL 8.3(L) 8.0(L) 8.8(L)  Total Protein 6.5 - 8.1 g/dL - - -  Total Bilirubin 0.3 - 1.2 mg/dL - - -  Alkaline Phos 38 - 126 U/L - - -  AST 15 - 41 U/L - - -  ALT 0 - 44 U/L - - -    Imaging:   No results found.   Assessment/Plan:   Active Problems:   Secondary open-angle glaucoma of both eyes, severe stage   Hypertension   DM (diabetes mellitus), type 2, uncontrolled with  complications ([place])   Hyperlipidemia   Stroke ([place])   Mild concentric left ventricular hypertrophy (LVH)   Middle cerebral artery stenosis, left   Patient Summary:  Sharon Blankenship is a 55 y.o. 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  Acute CVA, multiple left MCA territory subacute ischemic infarctions 2/2 MCA territory stenosis s/p cerebral arteriogram with revascularization of left MCA using stent-assisted angioplasty via R femoral approach 07/28/20 Hx multiple left MCA territory embolic stroke (12/3816) 2/2 symptomatic MCA territory stenosis and hypertension Patient tolerated stent placement well. She remains neurologically intact. Placed on Cleviprex drip following procedure. BP 110s-130s/50s-60s overnight. Plan to wean Cleviprex drip today per neurology.  - Neurology following, appreciate their expertise - Strict blood pressure control with systolic 299-371 for first 24 hours postprocedure.  Bedrest as per post intervention protocol. - Wean Cleviprex drip - Long-term BP goal normotensive after MCA stenting. - No OT follow-up, PT recommend HHPT - Continue DAPT (aspirin 81 mg daily and Brilinta 90 mg BID) - Spoke with patient's glaucoma specialist yesterday who does not require stopping DAPT prior  to scheduled glaucoma surgery on 08/13/20.  - Telemetry monitoring - Frequent neurochecks   Essential hypertension Placed on Cleviprex drip following procedure. BP 110s-130s/50s-60s overnight. Plan to wean Cleviprex drip today per neurology.  - BP goal as above. - Continue home losartan 100 mg daily - Holding home Diamox 250 mg 3 times daily as above, will resume in the next 1-2 days  AKI Serum creatinine had improved back to baseline which appears to be 1-1.2, however elevated at 1.45  today. Suspect mild dehydration and contrast load contributing. Will continue to monitor. - encourage PO hydration - AM BMP  Type 2 diabetes Serum glucose elevated at 384 this morning, likely due to intraoperative dexamethasone. A1c 7.7.  Current regimen of Tresiba 76 units daily, Ozempic 1 mg daily, metformin 1000 mg twice daily.  Fiasp 18 units daily as needed.  Continue gabapentin 100 mg 3 times daily for nerve pain. -SSI, Lantus 50 units daily during admission   Hyperlipidemia continue home atorvastatin 20 mg daily.    Glaucoma History of glaucoma, on acetazolamide 250 mg 3 times daily. Holding as above. - Continue Xalatan solution both eyes daily, Cosopt 1 drop right eye twice daily, Alphagan 1 drop both eyes 3 times daily - Will resume Diamox after MCA stenting - As above, patient is scheduled for glaucoma surgery at [facility] on 08/13/20. Per her ophthalmologist, she will not need to stop DAPT prior to surgery.    Diet: Heart Healthy IVF: none VTE: Enoxaparin Code: Full PT/OT recs: HHPT (would have to be charity HHPT per TOC), no OT follow-up needed    Please contact the on call pager after 5 pm and on weekends at [phone].  [place], MD PGY-1 Internal Medicine Teaching Service Pager: [phone] 07/30/2020

## 2020-07-30 NOTE — Progress Notes (Signed)
Inpatient Diabetes Program Recommendations  AACE/ADA: New Consensus Statement on Inpatient Glycemic Control (2015)  Target Ranges:  Prepandial:   less than 140 mg/dL      Peak postprandial:   less than 180 mg/dL (1-2 hours)      Critically ill patients:  140 - 180 mg/dL   Lab Results  Component Value Date   GLUCAP 188 (H) 07/30/2020   HGBA1C 7.7 (H) 07/24/2020    Review of Glycemic Control Results for Sharon Blankenship, Sharon Blankenship (MRN [id number]) as of 07/30/2020 10:05  Ref. Range 07/29/2020 07:35 07/29/2020 11:21 07/29/2020 16:54 07/29/2020 21:17 07/30/2020 06:52  Glucose-Capillary Latest Ref Range: 70 - 99 mg/dL 273 (H) 236 (H) 179 (H) 263 (H) 188 (H)   Diabetes history: DM 2 Outpatient Diabetes medications: Tresiba 76 units + Ozempic 1 mg Weekly+ Metformin 1 gm bid, Fiasp 18 units as needed Current orders for Inpatient glycemic control:  Latus 50 units Daily Novolog 0-9 units tid + hs  Inpatient Diabetes Program Recommendations:    -  Increase Lantus to 56 units  Thanks,  Tama Headings RN, MSN, BC-ADM Inpatient Diabetes Coordinator Team Pager [phone] (8a-5p)

## 2020-07-31 ENCOUNTER — Telehealth: Payer: Self-pay | Admitting: Student

## 2020-07-31 NOTE — Telephone Encounter (Signed)
-----   Message from Alexandria Lodge, MD sent at 07/30/2020 11:04 AM EDT ----- Regarding: hospital follow-up Hi there,   Could you schedule a hospital follow-up appointment for this patient in the next week Date of discharge: 07/30/20.  Thank you!  Sincerely,  Alexandria Lodge, MD

## 2020-07-31 NOTE — Telephone Encounter (Signed)
TOC HFU appointment 08/07/2020 at 3:15 pm with Dr. Bridgett Larsson.  Unable to contact patient via telephone.  Appointment letter mailed to patient.

## 2020-08-04 ENCOUNTER — Other Ambulatory Visit: Payer: Self-pay | Admitting: *Deleted

## 2020-08-04 NOTE — Patient Outreach (Signed)
[place] [facility]) Care Management  08/04/2020  Sharon Blankenship Apr 27, 1965 [id number]   RED ON EMMI ALERT - Stroke Day # 1 Date: 4/30 Red Alert Reason: Not scheduled follow up appointment   Outreach attempt #1, successful however not successful with assessment.  State she is currently at the doctor's office and will call this care manager back once she is home.   Plan: RN CM will await call back.  If no call back, will send outreach letter and follow up within the next 3-4 business days.  Valente David, [place], MSN [place] [phone]

## 2020-08-04 NOTE — Telephone Encounter (Signed)
Transition Care Management Unsuccessful Follow-up Telephone Call  Date of discharge and from where:  Discharged 4/27 from the hospital.  Attempts:  X 1  Reason for unsuccessful TCM follow-up call:  No answer;  Left voice message.

## 2020-08-05 NOTE — Telephone Encounter (Signed)
TOC call Pt was discharged from the hospital on 07/30/20. She lives alone. State she thought [place] was coming to see her but as this time, no one has made a visit. Stated her sister helps her with ADL's (when needed) and with meals. She uses a walker. Stated her vision is getting worse. For transportation, her sister drives or she uses [employer]. Reminded pt of her appt on Thursday 5/5 with Dr Bridgett Larsson @ 1515 PM. And to call if she has any problems and/or concerns; office # given.

## 2020-08-06 NOTE — Progress Notes (Signed)
   CC: Hospital follow-up for stroke  HPI:  Sharon Blankenship is a 55 y.o. female with history as below presenting for hospital follow-up for stroke. Please refer to problem based charting for further details of assessment and plan of current problem and chronic medical conditions.  She is to have Medicaid, boluses at some point.  Has issues with medication affordability.  I have transferred all of her prescriptions today to [facility] outpatient pharmacy, will work with her to find medications that will be affordable through the inner medicine program.  Past Medical History:  Diagnosis Date  . CVA (cerebral vascular accident) ([place])   . Diabetes mellitus without complication ([place])   . Hypertension   . Open-angle glaucoma    Review of Systems:   Review of Systems  Musculoskeletal: Negative for falls and myalgias.  Neurological: Positive for sensory change and focal weakness. Negative for dizziness, loss of consciousness and headaches.  All other systems reviewed and are negative.   Physical Exam: Vitals:   08/07/20 1502  BP: 114/75  Pulse: (!) 101  Temp: 98.3 F (36.8 C)  TempSrc: Oral  SpO2: 100%  Weight: 234 lb 6.4 oz (106.3 kg)  Height: 5\' 7"  (1.702 m)   Constitutional: no acute distress Head: atraumatic ENT: external ears normal Cardiovascular: regular rate and rhythm, normal heart sounds Pulmonary: effort normal, normal breath sounds bilaterally Abdominal: flat, nontender, no rebound tenderness, bowel sounds normal Skin: warm and dry Neurological: alert, cranial nerves intact, speech intact, subtle weakness in the right lower extremity, mild 4 out of 5 weakness in the right upper extremity, no pronator drift, seems to have mild dysmetria Psychiatric: normal mood and affect  Assessment & Plan:   See Encounters Tab for problem based charting.  Patient discussed with Dr. Evette Doffing

## 2020-08-06 NOTE — Telephone Encounter (Signed)
Agree, I will discuss this with her tomorrow.

## 2020-08-07 ENCOUNTER — Other Ambulatory Visit: Payer: Self-pay

## 2020-08-07 ENCOUNTER — Other Ambulatory Visit: Payer: Self-pay | Admitting: *Deleted

## 2020-08-07 ENCOUNTER — Other Ambulatory Visit (HOSPITAL_COMMUNITY): Payer: Self-pay

## 2020-08-07 ENCOUNTER — Ambulatory Visit (INDEPENDENT_AMBULATORY_CARE_PROVIDER_SITE_OTHER): Payer: Self-pay | Admitting: Student

## 2020-08-07 ENCOUNTER — Telehealth: Payer: Self-pay | Admitting: *Deleted

## 2020-08-07 ENCOUNTER — Encounter: Payer: Self-pay | Admitting: Student

## 2020-08-07 ENCOUNTER — Telehealth: Payer: Self-pay | Admitting: Pharmacist

## 2020-08-07 VITALS — BP 114/75 | HR 101 | Temp 98.3°F | Ht 67.0 in | Wt 234.4 lb

## 2020-08-07 DIAGNOSIS — E118 Type 2 diabetes mellitus with unspecified complications: Secondary | ICD-10-CM

## 2020-08-07 DIAGNOSIS — D649 Anemia, unspecified: Secondary | ICD-10-CM

## 2020-08-07 DIAGNOSIS — D509 Iron deficiency anemia, unspecified: Secondary | ICD-10-CM

## 2020-08-07 DIAGNOSIS — IMO0002 Reserved for concepts with insufficient information to code with codable children: Secondary | ICD-10-CM

## 2020-08-07 DIAGNOSIS — E1165 Type 2 diabetes mellitus with hyperglycemia: Secondary | ICD-10-CM

## 2020-08-07 DIAGNOSIS — N179 Acute kidney failure, unspecified: Secondary | ICD-10-CM

## 2020-08-07 DIAGNOSIS — Z794 Long term (current) use of insulin: Secondary | ICD-10-CM

## 2020-08-07 DIAGNOSIS — K219 Gastro-esophageal reflux disease without esophagitis: Secondary | ICD-10-CM

## 2020-08-07 DIAGNOSIS — E119 Type 2 diabetes mellitus without complications: Secondary | ICD-10-CM

## 2020-08-07 DIAGNOSIS — I63512 Cerebral infarction due to unspecified occlusion or stenosis of left middle cerebral artery: Secondary | ICD-10-CM

## 2020-08-07 DIAGNOSIS — I10 Essential (primary) hypertension: Secondary | ICD-10-CM

## 2020-08-07 MED ORDER — LATANOPROST 0.005 % OP SOLN
1.0000 [drp] | Freq: Every day | OPHTHALMIC | 5 refills | Status: AC
  Filled 2020-08-07: qty 2.5, 25d supply, fill #0
  Filled 2020-10-07: qty 2.5, 25d supply, fill #1
  Filled 2020-11-03: qty 2.5, 18d supply, fill #2
  Filled 2020-11-21: qty 2.5, 18d supply, fill #3
  Filled 2020-12-22: qty 2.5, 18d supply, fill #4

## 2020-08-07 MED ORDER — LORATADINE 10 MG PO TABS
10.0000 mg | ORAL_TABLET | Freq: Every day | ORAL | 5 refills | Status: DC | PRN
  Filled 2020-08-07: qty 30, 30d supply, fill #0

## 2020-08-07 MED ORDER — INSULIN PEN NEEDLE 32G X 4 MM MISC
11 refills | Status: DC
  Filled 2020-08-07: qty 200, fill #0

## 2020-08-07 MED ORDER — LOSARTAN POTASSIUM 100 MG PO TABS
100.0000 mg | ORAL_TABLET | Freq: Every day | ORAL | 5 refills | Status: DC
  Filled 2020-08-07: qty 30, 30d supply, fill #0

## 2020-08-07 MED ORDER — MONTELUKAST SODIUM 10 MG PO TABS: ORAL_TABLET | Freq: Every day | ORAL | 5 refills | Status: AC

## 2020-08-07 MED ORDER — LOSARTAN POTASSIUM 100 MG PO TABS
100.0000 mg | ORAL_TABLET | Freq: Every day | ORAL | 5 refills | Status: AC
  Filled 2020-08-07: qty 30, 30d supply, fill #0

## 2020-08-07 MED ORDER — INSULIN GLARGINE 100 UNITS/ML SOLOSTAR PEN
76.0000 [IU] | PEN_INJECTOR | Freq: Every day | SUBCUTANEOUS | 5 refills | Status: DC
  Filled 2020-08-07: qty 24, 31d supply, fill #0

## 2020-08-07 MED ORDER — OZEMPIC (1 MG/DOSE) 4 MG/3ML ~~LOC~~ SOPN
1.0000 mg | PEN_INJECTOR | SUBCUTANEOUS | 5 refills | Status: DC
  Filled 2020-08-07: qty 3, 28d supply, fill #0

## 2020-08-07 MED ORDER — ATORVASTATIN CALCIUM 40 MG PO TABS
40.0000 mg | ORAL_TABLET | Freq: Every day | ORAL | 5 refills | Status: DC
  Filled 2020-08-07: qty 30, 30d supply, fill #0

## 2020-08-07 MED ORDER — TRULICITY 0.75 MG/0.5ML ~~LOC~~ SOAJ
1.5000 mg | SUBCUTANEOUS | 5 refills | Status: DC
  Filled 2020-08-07: qty 4, 28d supply, fill #0

## 2020-08-07 MED ORDER — NOVOLOG FLEXPEN 100 UNIT/ML ~~LOC~~ SOPN
18.0000 [IU] | PEN_INJECTOR | Freq: Three times a day (TID) | SUBCUTANEOUS | 5 refills | Status: AC
  Filled 2020-08-07: qty 15, 28d supply, fill #0

## 2020-08-07 MED ORDER — FIASP FLEXTOUCH 100 UNIT/ML ~~LOC~~ SOPN
18.0000 [IU] | PEN_INJECTOR | Freq: Three times a day (TID) | SUBCUTANEOUS | 11 refills | Status: DC | PRN
  Filled 2020-08-07: qty 9, fill #0

## 2020-08-07 MED ORDER — BRIMONIDINE TARTRATE 0.2 % OP SOLN
1.0000 [drp] | Freq: Three times a day (TID) | OPHTHALMIC | 4 refills | Status: DC
  Filled 2020-08-07: qty 5, 34d supply, fill #0

## 2020-08-07 MED ORDER — DORZOLAMIDE HCL-TIMOLOL MAL 2-0.5 % OP SOLN
1.0000 [drp] | Freq: Two times a day (BID) | OPHTHALMIC | 5 refills | Status: DC
  Filled 2020-08-07: qty 10, 30d supply, fill #0

## 2020-08-07 MED ORDER — GLUCOSE BLOOD VI STRP
ORAL_STRIP | 12 refills | Status: DC
  Filled 2020-08-07: qty 100, 90d supply, fill #0
  Filled 2020-08-08: qty 100, 25d supply, fill #0
  Filled 2020-09-29 (×3): qty 100, 25d supply, fill #1

## 2020-08-07 MED ORDER — MELOXICAM 15 MG PO TABS: 15.0000 mg | ORAL_TABLET | Freq: Every day | ORAL | 1 refills | Status: DC | PRN

## 2020-08-07 MED ORDER — PANTOPRAZOLE SODIUM 40 MG PO TBEC
40.0000 mg | DELAYED_RELEASE_TABLET | Freq: Every day | ORAL | 5 refills | Status: DC
  Filled 2020-08-07: qty 30, 30d supply, fill #0

## 2020-08-07 MED ORDER — METFORMIN HCL 1000 MG PO TABS
1000.0000 mg | ORAL_TABLET | Freq: Two times a day (BID) | ORAL | 5 refills | Status: AC
  Filled 2020-08-07: qty 60, 30d supply, fill #0

## 2020-08-07 MED ORDER — TICAGRELOR 90 MG PO TABS
90.0000 mg | ORAL_TABLET | Freq: Two times a day (BID) | ORAL | 5 refills | Status: DC
  Filled 2020-08-07: qty 60, 30d supply, fill #0

## 2020-08-07 MED ORDER — VICTOZA 18 MG/3ML ~~LOC~~ SOPN
PEN_INJECTOR | SUBCUTANEOUS | 5 refills | Status: DC
  Filled 2020-08-07: qty 6, 33d supply, fill #0

## 2020-08-07 MED ORDER — MELOXICAM 15 MG PO TABS: 15.0000 mg | ORAL_TABLET | Freq: Every day | ORAL | 1 refills | Status: AC | PRN

## 2020-08-07 MED ORDER — FERROUS SULFATE 325 (65 FE) MG PO TABS: 325.0000 mg | ORAL_TABLET | Freq: Every day | ORAL | 5 refills | Status: AC

## 2020-08-07 MED ORDER — LANTUS SOLOSTAR 100 UNIT/ML ~~LOC~~ SOPN
76.0000 [IU] | PEN_INJECTOR | Freq: Every day | SUBCUTANEOUS | 5 refills | Status: AC
  Filled 2020-08-07: qty 24, 31d supply, fill #0

## 2020-08-07 MED ORDER — TRESIBA FLEXTOUCH 200 UNIT/ML ~~LOC~~ SOPN
76.0000 [IU] | PEN_INJECTOR | Freq: Every day | SUBCUTANEOUS | 5 refills | Status: DC
  Filled 2020-08-07: qty 12, 31d supply, fill #0

## 2020-08-07 MED ORDER — DORZOLAMIDE HCL-TIMOLOL MAL 2-0.5 % OP SOLN
1.0000 [drp] | Freq: Two times a day (BID) | OPHTHALMIC | 5 refills | Status: DC
  Filled 2020-08-07: qty 10, 100d supply, fill #0

## 2020-08-07 MED ORDER — FERROUS SULFATE 325 (65 FE) MG PO TABS: 325.0000 mg | ORAL_TABLET | Freq: Every day | ORAL | 5 refills | Status: DC

## 2020-08-07 MED ORDER — LATANOPROST 0.005 % OP SOLN
1.0000 [drp] | Freq: Every day | OPHTHALMIC | 5 refills | Status: DC
  Filled 2020-08-07: qty 2.5, 50d supply, fill #0

## 2020-08-07 MED ORDER — GABAPENTIN 100 MG PO CAPS
100.0000 mg | ORAL_CAPSULE | Freq: Three times a day (TID) | ORAL | 5 refills | Status: AC | PRN
  Filled 2020-08-07: qty 90, 30d supply, fill #0

## 2020-08-07 MED ORDER — GABAPENTIN 100 MG PO CAPS
100.0000 mg | ORAL_CAPSULE | Freq: Three times a day (TID) | ORAL | 5 refills | Status: DC | PRN
  Filled 2020-08-07: qty 90, 30d supply, fill #0

## 2020-08-07 MED ORDER — PANTOPRAZOLE SODIUM 40 MG PO TBEC
40.0000 mg | DELAYED_RELEASE_TABLET | Freq: Every day | ORAL | 5 refills | Status: AC
  Filled 2020-08-07: qty 30, 30d supply, fill #0

## 2020-08-07 MED ORDER — LORATADINE 10 MG PO TABS
10.0000 mg | ORAL_TABLET | Freq: Every day | ORAL | 5 refills | Status: AC | PRN
  Filled 2020-08-07 – 2020-09-12 (×2): qty 30, 30d supply, fill #0

## 2020-08-07 MED ORDER — MONTELUKAST SODIUM 10 MG PO TABS: ORAL_TABLET | Freq: Every day | ORAL | 5 refills | Status: DC

## 2020-08-07 MED ORDER — METFORMIN HCL 1000 MG PO TABS
1000.0000 mg | ORAL_TABLET | Freq: Two times a day (BID) | ORAL | 5 refills | Status: DC
  Filled 2020-08-07: qty 60, 30d supply, fill #0

## 2020-08-07 MED ORDER — BRIMONIDINE TARTRATE 0.2 % OP SOLN
1.0000 [drp] | Freq: Three times a day (TID) | OPHTHALMIC | 4 refills | Status: DC
  Filled 2020-08-07: qty 5, 17d supply, fill #0
  Filled 2020-10-07: qty 5, 17d supply, fill #1
  Filled 2020-10-22: qty 5, 17d supply, fill #2
  Filled 2020-11-04: qty 5, 12d supply, fill #3
  Filled 2020-11-21: qty 5, 12d supply, fill #4

## 2020-08-07 MED ORDER — INSULIN PEN NEEDLE 32G X 4 MM MISC
1.0000 | Freq: Every day | 11 refills | Status: AC
  Filled 2020-08-07: qty 200, fill #0
  Filled 2020-08-08: qty 200, 40d supply, fill #0

## 2020-08-07 NOTE — Assessment & Plan Note (Addendum)
Patient admitted for stroke from 4/20 - 4/27. History of previous stroke in 03/2020 as well. Discharged with Lipitor 40mg , Brillinta 90mg  BID, and aspirin. S/p stent of left MCA. To follow up with IR, Dr. Estanislado Pandy.  We will work on tight A1c and blood pressure control.  On exam, she has subtle right lower extremity weakness and mild right upper extremity weakness.  Fine motor skills are still lacking, but strength is fairly good.  - Home PT/OT ordered -Continue aspirin and Brilinta - Continue Lipitor 40 mg - Continue medications for hypertension with goal of normotensive and diabetes with A1c goal less than 7 - Gave patient phone number to make follow appointment with interventional radiology - Patient has concerns about medication affordability, referral to pharmacist made to assist with this and medication sent to RN care medicine program for assistance

## 2020-08-07 NOTE — Patient Instructions (Signed)
Thank you for allowing [place] to be a part of your care today, it was a pleasure seeing you. We discussed your stroke, hypertension, diabetes  We will hold off on labs until after you get the [facility]  I have sent her medications to our outpatient pharmacy, please pick these up as needed.  I have not changed any dosages today.  Please call interventional radiology at [phone] to make an appointment to follow-up with Dr. Estanislado Pandy.  Please bring your glucometer at your next visit, we will look at your glucoses over the past few weeks.  I have sent the referral for home health.  Please call us back early next week if you have not heard from them.  Please continue to try to be active and exercise your right arm and leg is much as possible.  Otherwise, continue current medications.  I will call with results of your labs.  Please follow-up with me on May 24 at 3:15 PM.   Thank you, and please call the Internal Medicine Clinic at [phone] if you have any questions.  Best, Dr. Bridgett Larsson

## 2020-08-07 NOTE — Telephone Encounter (Signed)
Call from [facility] with [place] - state pt has an appt this afternoon and pt will need a new referral for [facility] PT/OT. She will be seeing Dr Bridgett Larsson.

## 2020-08-07 NOTE — Patient Outreach (Signed)
[place] [facility]) Care Management  08/07/2020  Sharon Blankenship Mar 14, 1966 [id number]   RED ON EMMI ALERT - Stroke Day # 1 Date: 4/30 Red Alert Reason: Not scheduled follow up appointment and Not filled new prescriptions   Outreach attempt #2, successful.  Identity verified.  This care manager introduced self and stated purpose of call.  [facility] care management services explained.    Member report she lives alone but her sister has been helping her with ADL's and meals.  She was to have home health for PT/OT but report no one has been out to see her yet.  Call placed to [facility] as they were to enroll her in their charity program.  Spoke to [place], state they made 4 attempts over the last week without success and has now closed out the referral, will need new referral.  Member has follow up appointment today with PCP, call placed to office to request new referral be placed.  Member still does not have appointment with neurologist, state they will call to schedule 6 week follow up.  Member report she is concerned with the cost of her medications as she does not have insurance.  State she was told there was a $3 program where she could get her meds, has not heard anything else since discharge.  Agrees to follow up from [place] team.    Denies any urgent concerns, encouraged to contact this care manager with questions.    Plan: RN CM will send education regarding stroke recovery and managing blood pressure.  Will place referral to [place] team.  Will notify chronic case management team within PCP office of member's case.  Will follow up with member within the next 2 weeks.  Sharon Blankenship, [place], MSN [place] [phone]

## 2020-08-07 NOTE — Assessment & Plan Note (Signed)
BP: 114/75  Denies chest pain, palpitations, dizziness, headaches, LOC.    -Continue losartan 100 mg

## 2020-08-07 NOTE — Assessment & Plan Note (Addendum)
Patient has history of iron deficiency anemia and has been taking ferrous sulfate.  On recent admission, she was noted to have a mild normocytic anemia.  - Repeat CBC today - Obtain studies - Continue ferrous sulfate - Plan to refer for colonoscopy at future visit, she not had one yet  Addendum: Iron 39, ferritin 171.  Suggestive of anemia of chronic disease.  No known reason for chronic inflammation, except sinus tachycardia noted for which they plan to refer to cardiology, deferred due to lack of insurance.  At this time she has the orange card.  At next visit, plan to refer to cardiology and order age-appropriate cancer screenings.

## 2020-08-07 NOTE — Assessment & Plan Note (Addendum)
Patient reports that she has all her medications, states that her home blood sugars have been 80s to 120s over the past week.  She demonstrates good understanding of all her medications and how they need to be used. Lab Results  Component Value Date   HGBA1C 7.7 (H) 07/24/2020   HGBA1C 7.0 (A) 06/05/2020   HGBA1C 6.3 (H) 03/22/2020   - Continue metformin 1000 mg twice daily -Stable no changes were made for medication affordability through our IM program with [facility] outpatient pharmacy     - Change Ozempic 1 mg weekly to Victoza, 0.6mg  daily for 1 week then increase to 1.2mg  daily     - Change Tresiba 76 units daily to Lantus 76 units at bedtime     - Change Fiasp to NovoLog, she is giving herself a sliding scale - Follow-up in 2 weeks to ensure all medications are obtainable

## 2020-08-07 NOTE — Telephone Encounter (Signed)
I will address this when I see her.

## 2020-08-07 NOTE — Addendum Note (Signed)
Addended by: Andrew Au on: 08/07/2020 06:16 PM   Modules accepted: Orders

## 2020-08-07 NOTE — Telephone Encounter (Signed)
Pharmacy Transitions of Care Follow-up Telephone Call  Date of discharge: 07/30/20  Discharge Diagnosis:Left MCA Stroke   How have you been since you were released from the hospital? Patient doing well  Medication changes made at discharge:  - START:Brilinta 90mg  twice daily  - STOPPED:   - CHANGED:Increase Atorvastatin 40mg    Medication changes verified by the patient?  Yes      Medication Accessibility:  Home Pharmacy: [facility]  Was the patient provided with refills on discharged medications? no   Have all prescriptions been transferred from [facility] to home pharmacy? N/A  Is the patient able to afford medications? No, she currently uses the health department . Eligible patient assistance: Yes, she is planning to ask about Brilinta app at her appointment with internal med   Medication review TICAGRELOR (BRILINTA) Ticagrelor 90 mg BID initiated on 4/27.  - Educated patient on expected duration of therapy of aspirin 81mg  with ticagrelor. Aspirin will be continued indefinitely. - Discussed importance of taking medication around the same time every day, - Reviewed potential DDIs with patient - Advised patient of medications to avoid (NSAIDs, aspirin maintenance doses>100 mg daily) - Educated that Tylenol (acetaminophen) will be the preferred analgesic to prevent risk of bleeding  - Emphasized importance of monitoring for signs and symptoms of bleeding (abnormal bruising, prolonged bleeding, nose bleeds, bleeding from gums, discolored urine, black tarry stools)  - Educated patient to notify doctor if shortness of breath or abnormal heartbeat occur - Advised patient to alert all providers of antiplatelet therapy prior to starting a new medication or having a procedure   Follow-up Appointments:  PCP Hospital f/u appt confirmed? May .24th with Edison Simon    If their condition worsens, is the pt aware to call PCP or go to the Emergency Dept.? yes  Final Patient  Assessment: Patient doing well. We discussed her Brilinta and talked about Patient Assistance and what to expect with that process. Patient also considering switching her medications to the [place] [facility] [place]

## 2020-08-08 ENCOUNTER — Other Ambulatory Visit (HOSPITAL_COMMUNITY): Payer: Self-pay

## 2020-08-08 LAB — CBC WITH DIFFERENTIAL/PLATELET
Basophils Absolute: 0 10*3/uL (ref 0.0–0.2)
Basos: 0 %
EOS (ABSOLUTE): 0.1 10*3/uL (ref 0.0–0.4)
Eos: 1 %
Hematocrit: 31.1 % — ABNORMAL LOW (ref 34.0–46.6)
Hemoglobin: 9.7 g/dL — ABNORMAL LOW (ref 11.1–15.9)
Immature Grans (Abs): 0.1 10*3/uL (ref 0.0–0.1)
Immature Granulocytes: 1 %
Lymphocytes Absolute: 1.7 10*3/uL (ref 0.7–3.1)
Lymphs: 18 %
MCH: 26 pg — ABNORMAL LOW (ref 26.6–33.0)
MCHC: 31.2 g/dL — ABNORMAL LOW (ref 31.5–35.7)
MCV: 83 fL (ref 79–97)
Monocytes Absolute: 0.5 10*3/uL (ref 0.1–0.9)
Monocytes: 6 %
Neutrophils Absolute: 6.7 10*3/uL (ref 1.4–7.0)
Neutrophils: 74 %
Platelets: 391 10*3/uL (ref 150–450)
RBC: 3.73 x10E6/uL — ABNORMAL LOW (ref 3.77–5.28)
RDW: 14.8 % (ref 11.7–15.4)
WBC: 9.1 10*3/uL (ref 3.4–10.8)

## 2020-08-08 LAB — IRON AND TIBC
Iron Saturation: 16 % (ref 15–55)
Iron: 39 ug/dL (ref 27–159)
Total Iron Binding Capacity: 243 ug/dL — ABNORMAL LOW (ref 250–450)
UIBC: 204 ug/dL (ref 131–425)

## 2020-08-08 LAB — FERRITIN: Ferritin: 171 ng/mL — ABNORMAL HIGH (ref 15–150)

## 2020-08-08 MED ORDER — MICROLET LANCETS MISC
0 refills | Status: AC
  Filled 2020-08-08: qty 100, 25d supply, fill #0

## 2020-08-08 MED ORDER — CONTOUR NEXT MONITOR W/DEVICE KIT
PACK | 0 refills | Status: DC
  Filled 2020-08-08: qty 1, 30d supply, fill #0

## 2020-08-11 ENCOUNTER — Telehealth (HOSPITAL_COMMUNITY): Payer: Self-pay

## 2020-08-11 NOTE — Progress Notes (Signed)
Internal Medicine Clinic Attending  Case discussed with Dr. Chen  At the time of the visit.  We reviewed the resident's history and exam and pertinent patient test results.  I agree with the assessment, diagnosis, and plan of care documented in the resident's note. 

## 2020-08-11 NOTE — Telephone Encounter (Signed)
Called to schedule f/u, no answer, left vm. AW 

## 2020-08-12 ENCOUNTER — Telehealth: Payer: Self-pay

## 2020-08-12 ENCOUNTER — Other Ambulatory Visit: Payer: Self-pay | Admitting: *Deleted

## 2020-08-12 DIAGNOSIS — I63512 Cerebral infarction due to unspecified occlusion or stenosis of left middle cerebral artery: Secondary | ICD-10-CM

## 2020-08-12 NOTE — Telephone Encounter (Signed)
Order for Ambulatory referral to home health placed. Thank you!

## 2020-08-12 NOTE — Patient Outreach (Addendum)
[facility] [facility]) Care Management  08/12/2020  Sharon Blankenship [birth year] [id number]   Outgoing call placed to member to follow up on start of home health services.  No answer, HIPAA compliant voice message left.  Will follow up within the next 3-4 business days.    Update:   Incoming call received back from member.  State she still has not received call from [facility].  Call placed to [facility], they have not received new referral yet.  Outgoing call placed to PCP office to inquire about referral, awaiting call back.  Will follow up with member and PCP office within the next week.    Update @ 1525:    Call received back from [place] at PCP office, notified that member does not have insurance. This care manager is aware, advised Lauren that Alvis Lemmings was going to work with member through Doctor, general practice.  She will discuss with PCP and place new referral.  Valente David, RN, MSN [place] Manager [phone]

## 2020-08-12 NOTE — Telephone Encounter (Signed)
Returned call to [place] with [facility]. Explained patient has no insurance. Brayton Layman states patient has been accepted for the [facility] with [facility]. Please place an Ambulatory Referral for [facility] PT/OT/Skilled nursing 2/2 CVA.

## 2020-08-12 NOTE — Telephone Encounter (Signed)
[place] with [facility] requesting to speak with a nurse about getting home health orders for patient. Requesting new order to be placed to [facility]. Please call back.

## 2020-08-14 NOTE — Telephone Encounter (Signed)
Sharon Blankenship with [facility] has been notified that [facility] to [facility]. She will contact [facility] to make them aware.

## 2020-08-15 ENCOUNTER — Other Ambulatory Visit: Payer: Self-pay | Admitting: *Deleted

## 2020-08-15 NOTE — Patient Outreach (Signed)
[facility]) Care Management  08/15/2020  Sharon Blankenship [birth year] [id number]   Incoming call received from member, state she is still in need of help in the home.  She has not received call from [facility] regarding PT/OT and nursing orders.  Call placed to [facility], state they still have not received the referral and unfortunately they are unable to take on any more charity cases.  Referral placed to CSW for assistance with community resources and help in the home.  She does not have insurance/Medicaid.  Call placed to [facility], spoke with April.  She will look to see if they are able to provide services to member, request call back on Monday to confirm if referral will be accepted.  Will follow up with member pending response from [place] [facility].  Valente David, [place], MSN [place] [phone]

## 2020-08-18 ENCOUNTER — Other Ambulatory Visit: Payer: Self-pay | Admitting: *Deleted

## 2020-08-18 NOTE — Patient Outreach (Signed)
[place] [facility]) Care Management  08/18/2020  Sharon Blankenship Dec 21, 1965 [id number]   Call placed to [facility], per April they are able to take member's case.  Will complete referral.  Member notified that [facility] and CSW with [facility] will be contacting her for assessment of needs.  Advised that this program is not long term, she verbalizes understanding.  No questions at this time, will follow up within the next 2 weeks.  Valente David, [place], MSN [place] [phone]

## 2020-08-19 ENCOUNTER — Telehealth: Payer: Self-pay | Admitting: *Deleted

## 2020-08-19 ENCOUNTER — Other Ambulatory Visit (HOSPITAL_COMMUNITY): Payer: Self-pay

## 2020-08-19 NOTE — Telephone Encounter (Signed)
Attempts to do PA for Ozempic.  Patient no longer has [employer].  Expired 08/02/2020.  Patient took prescriptions  to [facility] .  Ozempic is not covered under the IM Program.  Ozempic was discontinued and patient was given prescriptions for Lantus and Victoza.  Patient stated that her sister picked up the medications for her.  She sated that she does not know the dosing of the new medications.  Spoke with Pharmacist who said that patient could come by and would be shown how to use.  Spoke with D. [facility] Diabetes Educator for the Clinics.  Butch Penny to call patient about medication dosing and instructions if needed on how to take.  Sander Nephew, RN 08/19/2020 10:52 AM.

## 2020-08-19 NOTE — Telephone Encounter (Signed)
Asked by nurse to call patient to answer her questions about her diabetes medicines. Left a message for a return call

## 2020-08-19 NOTE — Telephone Encounter (Signed)
Called and spoke with patient about new prescriptions (Victoza, Lantus, and Novolog). Went over how to take each medication. Patient currently has a dose of Ozempic left as well as one pen of Fiasp. Instructed patient that she make take her last dose of Ozempic this week and then next week when she would take Ozempic she can switch over to Victoza 0.6mg . Also instructed patient that she may finish her Fiasp pen before picking up the new Novolog prescription. Patient verbalized understanding of plan. Patient had no follow-up questions.

## 2020-08-20 ENCOUNTER — Ambulatory Visit: Payer: Medicaid Other | Admitting: *Deleted

## 2020-08-21 ENCOUNTER — Other Ambulatory Visit: Payer: Self-pay | Admitting: *Deleted

## 2020-08-21 NOTE — Patient Outreach (Signed)
[place] [facility]) Care Management  08/21/2020  Sharon Blankenship 12-06-1965 [id number]   Voice message received from member inquiring about in home assistance.  Call placed to member, state she received call from [facility] [facility] since leaving the message. They will start services on Monday, no additional questions.  Will follow up with member within the next 2 weeks.  Valente David, [place], MSN [place] [phone]

## 2020-08-25 ENCOUNTER — Other Ambulatory Visit: Payer: Self-pay | Admitting: *Deleted

## 2020-08-25 DIAGNOSIS — I639 Cerebral infarction, unspecified: Secondary | ICD-10-CM

## 2020-08-25 NOTE — Patient Outreach (Signed)
[place] [facility]) Care Management  08/25/2020  Deneene Tarver 1965-05-06 [id number]   Incoming call received from [facility] with [facility], state they will enroll member and make 17 total visits to the home.  Will plan to be in the home on Mondays and Thursdays.  Member expressed concern about ability to pay rent as she has not been able to work since having her stroke.  Will place referral to Care Guide for community resources.  Will follow up with member within the next week as planned.  Valente David, [place], MSN [place] [phone]

## 2020-08-25 NOTE — Progress Notes (Signed)
   CC: stroke, diabetes, anemia, medication management  HPI:  [name] is a 55 y.o. female with history as below presenting for follow up on the above. Please refer to problem based charting for further details of assessment and plan of current problem and chronic medical conditions.   Past Medical History:  Diagnosis Date  . CVA (cerebral vascular accident) ([place])   . Diabetes mellitus without complication ([place])   . Hypertension   . Open-angle glaucoma    Review of Systems:   Review of Systems  Constitutional: Negative for chills and fever.  Eyes: Positive for blurred vision and redness.  Neurological: Positive for focal weakness. Negative for speech change.  All other systems reviewed and are negative.    Physical Exam: Vitals:   08/26/20 1455  BP: 130/85  Pulse: 99  SpO2: 100%   Constitutional: no acute distress Head: atraumatic ENT: external ears normal Cardiovascular: regular rate and rhythm, normal heart sounds Pulmonary: effort normal, normal breath sounds bilaterally Abdominal: flat Skin: warm and dry Neurological: alert, no focal deficit, minimal right upper extremity weakness, decreased fine motor acuity, right lower extremity no longer has detectable weakness compared to the left Psychiatric: normal mood and affect  Assessment & Plan:   See Encounters Tab for problem based charting.  Patient discussed with Dr. Heber Bennet

## 2020-08-26 ENCOUNTER — Ambulatory Visit: Payer: Self-pay | Admitting: Pharmacist

## 2020-08-26 ENCOUNTER — Ambulatory Visit (INDEPENDENT_AMBULATORY_CARE_PROVIDER_SITE_OTHER): Payer: Self-pay | Admitting: Student

## 2020-08-26 ENCOUNTER — Encounter: Payer: Self-pay | Admitting: Student

## 2020-08-26 ENCOUNTER — Other Ambulatory Visit (HOSPITAL_COMMUNITY): Payer: Self-pay

## 2020-08-26 ENCOUNTER — Telehealth: Payer: Self-pay | Admitting: Student

## 2020-08-26 ENCOUNTER — Other Ambulatory Visit: Payer: Self-pay

## 2020-08-26 VITALS — BP 130/85 | HR 99

## 2020-08-26 DIAGNOSIS — I63512 Cerebral infarction due to unspecified occlusion or stenosis of left middle cerebral artery: Secondary | ICD-10-CM

## 2020-08-26 DIAGNOSIS — D509 Iron deficiency anemia, unspecified: Secondary | ICD-10-CM

## 2020-08-26 DIAGNOSIS — IMO0002 Reserved for concepts with insufficient information to code with codable children: Secondary | ICD-10-CM

## 2020-08-26 DIAGNOSIS — E1165 Type 2 diabetes mellitus with hyperglycemia: Secondary | ICD-10-CM

## 2020-08-26 DIAGNOSIS — Z79899 Other long term (current) drug therapy: Secondary | ICD-10-CM

## 2020-08-26 DIAGNOSIS — H4053X3 Glaucoma secondary to other eye disorders, bilateral, severe stage: Secondary | ICD-10-CM

## 2020-08-26 DIAGNOSIS — E118 Type 2 diabetes mellitus with unspecified complications: Secondary | ICD-10-CM

## 2020-08-26 DIAGNOSIS — Z0000 Encounter for general adult medical examination without abnormal findings: Secondary | ICD-10-CM

## 2020-08-26 DIAGNOSIS — Z23 Encounter for immunization: Secondary | ICD-10-CM

## 2020-08-26 MED ORDER — TRESIBA FLEXTOUCH 100 UNIT/ML ~~LOC~~ SOPN: 70.0000 [IU] | PEN_INJECTOR | Freq: Every day | SUBCUTANEOUS | 5 refills | Status: AC

## 2020-08-26 MED ORDER — DORZOLAMIDE HCL-TIMOLOL MAL 2-0.5 % OP SOLN
1.0000 [drp] | Freq: Two times a day (BID) | OPHTHALMIC | 5 refills | Status: AC
  Filled 2020-08-26 – 2020-10-07 (×2): qty 10, 100d supply, fill #0
  Filled 2020-11-21: qty 10, 100d supply, fill #1
  Filled 2021-01-05: qty 10, 80d supply, fill #2

## 2020-08-26 MED ORDER — PREDNISOLONE ACETATE 1 % OP SUSP
1.0000 [drp] | Freq: Four times a day (QID) | OPHTHALMIC | 0 refills | Status: DC
  Filled 2020-08-26: qty 5, 25d supply, fill #0
  Filled 2020-08-26: qty 10, 50d supply, fill #0

## 2020-08-26 MED ORDER — TICAGRELOR 90 MG PO TABS
90.0000 mg | ORAL_TABLET | Freq: Two times a day (BID) | ORAL | 5 refills | Status: AC
  Filled 2020-08-26: qty 60, 30d supply, fill #0
  Filled 2020-09-29: qty 60, 30d supply, fill #1
  Filled 2020-10-23: qty 60, 30d supply, fill #2

## 2020-08-26 MED ORDER — VICTOZA 18 MG/3ML ~~LOC~~ SOPN: 1.8000 mg | PEN_INJECTOR | Freq: Every day | SUBCUTANEOUS | 5 refills | Status: DC

## 2020-08-26 MED ORDER — PREDNISOLONE ACETATE 1 % OP SUSP
1.0000 [drp] | Freq: Four times a day (QID) | OPHTHALMIC | 0 refills | Status: AC
  Filled 2020-08-26: qty 10, 37d supply, fill #0

## 2020-08-26 MED ORDER — OZEMPIC (1 MG/DOSE) 4 MG/3ML ~~LOC~~ SOPN: 1.0000 mg | PEN_INJECTOR | SUBCUTANEOUS | 5 refills | Status: DC

## 2020-08-26 NOTE — Patient Instructions (Signed)
Thank you for allowing [place] to be a part of your care today, it was a pleasure seeing you. We discussed your stroke, diabetes, anemia, and medication management  I have ordered a mammogram for breast cancer screening I have ordered a FIT stool test for colon cancer screening. Please bring the kit back. If it is negative, this takes the place of a colonoscopy and you will need a repeat in 1 year. If positive, I will order a colonoscopy which will be covered by the orange card.  Please decrease your Tresiba/Lantus to 70 units at night time.  Please continue your Fiasp/Novolog. If your blood sugar is above 120 and you expect to eat a full meal, take the full dose of 18 units. We will plan to change this to a sliding scale dosing later.   I have sent your Brillinta and eye drops to the [facility]. Please call our office if you have further questions or need other refills.   Please follow up in 3 months   Thank you, and please call the Internal Medicine Clinic at [phone] if you have any questions.  Best, Dr. Bridgett Larsson

## 2020-08-26 NOTE — Assessment & Plan Note (Signed)
Patient has received 2 surgeries since her last visit, she has good follow-up with [facility] ophthalmology.  She reports that they have taken her off the acetazolamide at this time.  She is continuing a few other eyedrops.  - Follow-up with [facility] ophthalmology - Continue eyedrops, prescription sent to [facility] health [facility] wellness which has them affordably - Reportedly discontinued acetazolamide by her ophthalmologist

## 2020-08-26 NOTE — Progress Notes (Signed)
   Subjective/Objective:    Patient ID: Sharon Blankenship, female    DOB: 28-Jun-1965, 55 y.o.   MRN: [id number]  HPI Patient is a 55 y.o. female who presents for medication review and management.  She is in good spirits and presents with assistance in wheelchair. Patient's sister was present at appointment. Patient was referred on 08/07/20 and last seen by provider, Dr. Bridgett Larsson, before appt today.  Medication Adherence Questionnaire (A score of 2 or more points indicates risk for nonadherence)  Patient has used pill organizer in the past to help her remember to take her medications. She states at the moment she is not using a pill organizer and she lays all of the bottles out to help her remember. She states this is working well despite her having some confusion since her stroke.   Do you know what each of your medicines is for? 0 (1 point if no)  Do you ever have trouble remembering to take your medicine? 0 (2 points if yes)  Do you ever not take a medicine because you feel you do not need it?  0 (1 point if yes)  Do you think that any of your medicines is not helping you? 0 (1 point if yes)  Do you have any physical problems such as vision loss that keep you from taking your medicines as prescribed?  2; cost (2 points if yes)  Do you think any of your medicine is causing a side effect? 1; Diamox was dropping blood pressure low (1 point if yes)  Do you know the names of ALL of your medicines? 1 (1 point if no)  Do you think that you need ALL of your medicines? 0 (1 point if no)  In the past 6 months, have you missed getting a refill or a new prescription filled on time?  0(1 point if yes)  How often do you miss taking a dose of medicine?  0 Never (0 points), 1 or 2 times a month (1 points), 1 time a week (2 points), 2 or more times a week (3 points).   TOTAL SCORE 4/14     Assessment/Plan:   Understanding of regimen: excellent  Understanding of indications: excellent  Potential of compliance:  excellent  Patient has known adherence challenges based on score of 4 for questionnaire. Barriers include: physical barrier of cost. Despite patient reporting some confusion since her stroke, she is able to appropriately explain why she is taking all of her medicine. We initiated application for Novo nordisk patient assistance to try to obtain Ozempic, Tresiba, and Novolog for patient at no cost. Informed patient that this application takes time to process and we cannot guarantee she will be approved. Patient verbalized understanding. Medication list reviewed and updated.  Patient was provided with a printed medication list.   Follow-up appointment via telephone in one week to review blood sugars. Written patient instructions provided.  This appointment required 50 minutes of patient care (this includes precharting, chart review, review of results, and face-to-face care).  Thank you for involving pharmacy to assist in providing this patient's care.

## 2020-08-26 NOTE — Addendum Note (Signed)
Addended by: Marcelino Duster on: 08/26/2020 04:31 PM   Modules accepted: Orders

## 2020-08-26 NOTE — Assessment & Plan Note (Signed)
We are currently changing medications for affordability and applying for medication assistance.  She is starting Victoza this week, can remain at 1.8 mg dose as she was on high-dose Ozempic previously.  Applying for Ozempic patient assistance.  She is also currently taking Tyler Aas, will switch to Lantus which is on the $4 list at our outpatient pharmacy.  Also applying for patient assistance for Tyler Aas, so may switch back to this in the future.  She reports some low blood sugars of 60 to 70s in the mornings.  Then she has some high sugars in the 200s in the afternoon and evening.  - Decrease Tresiba/Lantus to 70 units - Discussed holding her short acting with meals for low blood sugars.  Would like to switch to a sliding scale in the future, but she is getting used to a complex medication regimen.  We will readdress in the future. - Continue Victoza 1.8 mg daily, switch to Ozempic on patient assistance is obtained - Continue metformin 1000 mg twice daily - Greatly appreciate pharmacy assistance with education and medication assistance

## 2020-08-26 NOTE — Telephone Encounter (Signed)
   Telephone encounter was:  Unsuccessful.  08/26/2020 Name: Sharon Blankenship MRN: [id number] DOB: 10-31-1965  Unsuccessful outbound call made today to assist with:  Financial Difficulties related to paying her rent and being evicted  Outreach Attempt:  1st Attempt  A HIPAA compliant voice message was left requesting a return call.  Instructed patient to call back at [phone].  [place], Care Management Phone: [phone] Email: [email or web address]

## 2020-08-26 NOTE — Assessment & Plan Note (Addendum)
Patient had her second stroke, admitted from 4/24 -4/27.  Status post stent of left MCA.  At last visit, she has subtle right lower extremity weakness and mild right upper extremity weakness with difficulty with fine motor skills.  Unfortunately, she has not been able to receive home physical therapy due to lack of insurance.  We initially were told she could receive charity care, but the company has since changed her mind.  Today, her symptoms are improved compared to last time.  There is minimal weakness of the right upper extremity, and no appreciable weakness of the right lower extremity.  She still has some impairment of fine motor function.  -We discussed some exercises to continue doing at home - Dual antiplatelet therapy with Brilinta and aspirin.  She will follow-up with interventional radiology tomorrow and discuss DAPT regimen - Brilinta sent to [facility] pharmacy which has it affordably - Diabetes control as elsewhere

## 2020-08-26 NOTE — Assessment & Plan Note (Signed)
Iron of 39 and ferritin of 171.  Consistent with some degree of iron deficiency anemia possibly also some degree of anemia of chronic disease.  Had a mammogram greater than 1 year ago which she states was negative, no colonoscopy previously.  - Follow-up FIT as she has the orange card, refer to colonoscopy if abnormal - Follow-up mammogram - Continue ferrous sulfate

## 2020-08-26 NOTE — Patient Instructions (Signed)
Sharon Blankenship it was a pleasure seeing you today.   Today we reviewed all of the medications you are currently taking. Included is an updated medication list. Please continue taking all medications as prescribed on this list.  We will work on patient assistance for three of your medications (Ozempic, Tyler Aas, and [place]). In the meantime keep taking your other medications as prescribed.  If you have any questions please call the clinic and ask to speak with me.  Follow-up with me via telephone in one week

## 2020-08-27 ENCOUNTER — Ambulatory Visit (HOSPITAL_COMMUNITY)
Admission: RE | Admit: 2020-08-27 | Discharge: 2020-08-27 | Disposition: A | Discharge status: Home / Self Care | Payer: MEDICAID | Source: Ambulatory Visit | Attending: Student | Admitting: Student

## 2020-08-27 ENCOUNTER — Other Ambulatory Visit: Payer: Self-pay

## 2020-08-27 DIAGNOSIS — I6602 Occlusion and stenosis of left middle cerebral artery: Secondary | ICD-10-CM

## 2020-08-27 NOTE — Progress Notes (Signed)
Submitted application for TRESIBA, NOVOLOG FLEXPEN, AND OZEMPIC to [place] for patient assistance.   Phone: [phone]

## 2020-08-28 HISTORY — PX: IR RADIOLOGIST EVAL & MGMT: IMG5224

## 2020-08-28 NOTE — Progress Notes (Signed)
Internal Medicine Clinic Attending  Case discussed with Dr. Chen  At the time of the visit.  We reviewed the resident's history and exam and pertinent patient test results.  I agree with the assessment, diagnosis, and plan of care documented in the resident's note. 

## 2020-09-03 ENCOUNTER — Telehealth: Payer: Self-pay | Admitting: Pharmacist

## 2020-09-03 ENCOUNTER — Telehealth: Payer: Self-pay | Admitting: Student

## 2020-09-03 NOTE — Telephone Encounter (Signed)
   Telephone encounter was:  Successful.  09/03/2020 Name: Sharon Blankenship MRN: [id number] DOB: [birth year]  [name] is a 55 y.o. year old female who is a primary care patient of [name], MD . The community resource team was consulted for assistance with Transportation Needs , Food Insecurity and Financial Difficulties related to housing reasons  Care guide performed the following interventions: Patient provided with information about care guide support team and interviewed to confirm resource needs Discussed resources to assist with SCAT Application part A and B, TAMS application, Word doc. For food pantries in her area 2 pages,Word doc. For rental assistance programs 3 pages in both letter and email form. Also placed her on MOW for [employer] consent to place patient referral to [place] referral to [place] via telephone.  Follow Up Plan:  No further follow up planned at this time. The patient has been provided with needed resources.  [place], Care Management Phone: [phone] Email: [email or web address]

## 2020-09-03 NOTE — Telephone Encounter (Signed)
Called patient to follow on blood glucose readings and let her know her patient assistance was approved.  Patient's current diabetes regimen:  Metformin 1000mg  BID Switching to Victoza 1.8mg  starting tomorrow (last dose of Ozempic last Thursday) Lantus 70 units once daily Fiasp not currently taking due to blood glucose readings  Patient reports her fasting blood glucose readings have been in the 70's-90's. She reports the highest reading she saw was 132 which was in the evening after her meals. This has been without taking any Fiasp.   Recommended patient continue to hold Fiasp dose. Decreased Lantus to 63 units (10%). Discussed with patient that patient assistance will be replacing her current medications Tyler Aas replacing Lantus, Ozempic replacing Victoza, and Novolog replacing Fiasp). Patient verbalized understanding of plan and proper hypoglycemia treatment.   Will follow-up via telephone next Thursday to review patient's blood glucose readings.

## 2020-09-03 NOTE — Progress Notes (Signed)
Received notification from [place] regarding approval for TRESIBA 100U, [place], & OZEMPIC 3ML PEN. Patient assistance approved from 09/01/20 to 08/24/21.  A 4 month supply of each medication will [facility] in 10-14 business days from approval date.  Phone: [phone]

## 2020-09-05 ENCOUNTER — Other Ambulatory Visit: Payer: Self-pay | Admitting: *Deleted

## 2020-09-05 NOTE — Patient Outreach (Signed)
[facility]) Care Management  09/05/2020  Sharon Blankenship Dec 27, 1965 [id number]   Outgoing call placed to member to follow up on status.  State she is doing well, working with clinic pharmacist for medication management.  Also receiving visits from [place] [facility] during her recovery period.  Was seen by PCP on 5/24 and has had visits/procedures with ophthalmology over the last few weeks to help with eyesight, increasing independence.  Denies any urgent concerns, encouraged to contact this care manager with questions.  Advised that chronic care manager with PCP office would follow up doing forward, will close to [facility].  Valente David, [place], MSN [place] [phone]

## 2020-09-08 ENCOUNTER — Telehealth: Payer: Self-pay | Admitting: *Deleted

## 2020-09-08 NOTE — Chronic Care Management (AMB) (Signed)
  Care Management   Note  09/08/2020 Name: Sharon Blankenship MRN: [id number] DOB: 08/26/1965  Sharon Blankenship is a 55 y.o. year old female who is a primary care patient of [name], MD. I reached out to [employer] by phone today in response to a referral sent by Sharon Blankenship health plan.    [name] was given information about care management services today including:  1. Care management services include personalized support from designated clinical staff supervised by her physician, including individualized plan of care and coordination with other care providers 2. 24/7 contact phone numbers for assistance for urgent and routine care needs. 3. The patient may stop care management services at any time by phone call to the office staff.  Patient agreed to services and verbal consent obtained.   Follow up plan: Telephone appointment with care management team member scheduled for:09/11/2020  [place] Management

## 2020-09-11 ENCOUNTER — Telehealth: Payer: Self-pay | Admitting: Pharmacist

## 2020-09-11 ENCOUNTER — Ambulatory Visit: Payer: Medicaid Other

## 2020-09-11 DIAGNOSIS — IMO0002 Reserved for concepts with insufficient information to code with codable children: Secondary | ICD-10-CM

## 2020-09-11 DIAGNOSIS — H4010X3 Unspecified open-angle glaucoma, severe stage: Secondary | ICD-10-CM

## 2020-09-11 DIAGNOSIS — I63512 Cerebral infarction due to unspecified occlusion or stenosis of left middle cerebral artery: Secondary | ICD-10-CM

## 2020-09-11 DIAGNOSIS — D509 Iron deficiency anemia, unspecified: Secondary | ICD-10-CM

## 2020-09-11 NOTE — Patient Instructions (Addendum)
Visit Information  Ms. Sakai - it was very nice speaking with you today. I hope you find the information below helpful. In addition to what is here, I thought I'd share a few evening snack ideas with you:  2 or 3 peanut butter crackers Cheese and crackers Yogurt with 6 or 8 nuts (walnuts and pecans are great in yogurt) 2 hard boiled eggs and a small piece of toast  Hypoglycemia Hypoglycemia occurs when the level of sugar (glucose) in the blood is too low. Hypoglycemia can happen in people who have or do not have diabetes. It can develop quickly, and it can be a medical emergency. For most people, a blood glucose level below 70 mg/dL (3.9 mmol/L) is considered hypoglycemia. Glucose is a type of sugar that provides the body's main source of energy. Certain hormones (insulin and glucagon) control the level of glucose in the blood. Insulin lowers blood glucose, and glucagon raises blood glucose. Hypoglycemia can result from having too much insulin in the bloodstream, or from not eating enough food that contains glucose. You may also have reactive hypoglycemia, which happens within 4 hours after eating a meal. What are the causes? Hypoglycemia occurs most often in people who have diabetes and may be caused by: Diabetes medicine. Not eating enough, or not eating often enough. Increased physical activity. Drinking alcohol on an empty stomach. If you do not have diabetes, hypoglycemia may be caused by: A tumor in the pancreas. Not eating enough, or not eating for long periods at a time (fasting). A severe infection or illness. Problems after having bariatric surgery. Organ failure, such as kidney or liver failure. Certain medicines. What increases the risk? Hypoglycemia is more likely to develop in people who: Have diabetes and take medicines to lower blood glucose. Abuse alcohol. Have a severe illness. What are the signs or symptoms? Symptoms vary depending on whether the condition is mild,  moderate, or severe. Mild hypoglycemia Hunger. Anxiety. Sweating and feeling clammy. Dizziness or feeling light-headed. Sleepiness or restless sleep. Nausea. Increased heart rate. Headache. Blurry vision. Irritability. Tingling or numbness around the mouth, lips, or tongue. A change in coordination. Moderate hypoglycemia Confusion and poor judgment. Behavior changes. Weakness. Irregular heartbeat. Severe hypoglycemia Severe hypoglycemia is a medical emergency. It can cause: Fainting. Seizures. Loss of consciousness (coma). Death. How is this diagnosed? Hypoglycemia is diagnosed with a blood test to measure your blood glucose level. This blood test is done while you are having symptoms. Your health care provider may also do a physical exam and review your medical history. How is this treated? This condition can be treated by immediately eating or drinking something that contains sugar with 15 grams of rapid-acting carbohydrate, such as: 4 oz (120 mL) of fruit juice. 4-6 oz (120-150 mL) of regular soda (not diet soda). 8 oz (240 mL) of low-fat milk. Several pieces of hard candy. Check food labels to find out how many to eat for 15 grams. 1 Tbsp (15 mL) of sugar or honey. Treating hypoglycemia if you have diabetes If you are alert and able to swallow safely, follow the 15:15 rule: Take 15 grams of a rapid-acting carbohydrate. Talk with your health care provider about how much you should take. Options for getting 15 grams of rapid-acting carbohydrate include: Glucose tablets (take 4 tablets). Several pieces of hard candy. Check food labels to find out how many pieces to eat for 15 grams. 4 oz (120 mL) of fruit juice. 4-6 oz (120-150 mL) of regular (not diet)  soda. 1 Tbsp (15 mL) of honey or sugar. Check your blood glucose 15 minutes after you take the carbohydrate. If the repeat blood glucose level is still at or below 70 mg/dL (3.9 mmol/L), take 15 grams of a carbohydrate  again. If your blood glucose level does not increase above 70 mg/dL (3.9 mmol/L) after 3 tries, seek emergency medical care. After your blood glucose level returns to normal, eat a meal or a snack within 1 hour.   Treating severe hypoglycemia Severe hypoglycemia is when your blood glucose level is at or below 54 mg/dL (3 mmol/L). Severe hypoglycemia is a medical emergency. Get medical help right away. If you have severe hypoglycemia and you cannot eat or drink, you will need to be given glucagon. A family member or close friend should learn how to check your blood glucose and how to give you glucagon. Ask your health care provider if you need to have an emergency glucagon kit available. Severe hypoglycemia may need to be treated in a hospital. The treatment may include getting glucose through an IV. You may also need treatment for the cause of your hypoglycemia. Follow these instructions at home: General instructions Take over-the-counter and prescription medicines only as told by your health care provider. Monitor your blood glucose as told by your health care provider. If you drink alcohol: Limit how much you use to: 0-1 drink a day for nonpregnant women. 0-2 drinks a day for men. Be aware of how much alcohol is in your drink. In the [place] one drink equals one 12 oz bottle of beer (355 mL), one 5 oz glass of wine (148 mL), or one 1 oz glass of hard liquor (44 mL). Keep all follow-up visits as told by your health care provider. This is important. If you have diabetes: Always have a rapid-acting carbohydrate (15 grams) option with you to treat low blood glucose. Follow your diabetes management plan as directed. Make sure you: Know the symptoms of hypoglycemia. It is important to treat it right away to prevent it from becoming severe. Check your blood glucose as often as told. Always check before and after exercise. Always check your blood glucose before you drive a motorized vehicle. Take  your medicines as told. Follow your meal plan. Eat on time, and do not skip meals. Share your diabetes management plan with people in your workplace, school, and household. Carry a medical alert card or wear medical alert jewelry.   Contact a health care provider if: You have problems keeping your blood glucose in your target range. You have frequent episodes of hypoglycemia. Get help right away if: You continue to have hypoglycemia symptoms after eating or drinking something that contains 15 grams of fast-acting carbohydrate and you cannot get your blood glucose above 70 mg/dL (3.9 mmol/L) while following the 15:15 rule. Your blood glucose is at or below 54 mg/dL (3 mmol/L). You have a seizure. You faint. These symptoms may represent a serious problem that is an emergency. Do not wait to see if the symptoms will go away. Get medical help right away. Call your local emergency services (911 in the [place] Do not drive yourself to the hospital. Summary Hypoglycemia occurs when the level of sugar (glucose) in the blood is too low. Hypoglycemia can happen in people who have or do not have diabetes. It can develop quickly, and it can be a medical emergency. Make sure you know the symptoms of hypoglycemia and how to treat it. Always have a rapid-acting carbohydrate  snack with you to treat low blood sugar. This information is not intended to replace advice given to you by your health care provider. Make sure you discuss any questions you have with your health care provider. Document Revised: 02/14/2019 Document Reviewed: 02/14/2019 Elsevier Patient Education  2021 [place].   PATIENT GOALS:   Goals Addressed             This Visit's Progress    Adhere to Stroke care recommendations       Timeframe:  Long-Range Goal Priority:  High Start Date:        09/11/20                     Expected End Date:     12/13/20                  Follow Up Date 10/01/20    - eat healthy to increase  strength - start a journal or diary to track progress - keep appointments with my doctor  - notify my doctor of any new or worsening symptoms          Find Help in My Community       Timeframe:  Short-Term Goal Priority:  High Start Date:       09/03/20                      Expected End Date:    10/02/20                   Follow Up Date 06 10/01/2020    - follow-up on any referrals for help I am given by my community resources care guide          Monitor and Manage My Blood Sugar-Diabetes Type 2       Timeframe:  Long-Range Goal Priority:  High Start Date:      09/11/20                       Expected End Date:         12/13/20              Follow Up Date 10/01/20    - check blood sugar at prescribed times - check blood sugar if I feel it is too high or too low - take the blood sugar log to all doctor visits - take the blood sugar meter to all doctor visits             Ms. Starzyk was given information about Care Management services by the embedded care coordination team including:  Care Management services include personalized support from designated clinical staff supervised by her physician, including individualized plan of care and coordination with other care providers 24/7 contact phone numbers for assistance for urgent and routine care needs. The patient may stop CCM services at any time (effective at the end of the month) by phone call to the office staff.  Patient agreed to services and verbal consent obtained.   The patient verbalized understanding of instructions, educational materials, and care plan provided today and agreed to receive a mailed copy of patient instructions, educational materials, and care plan.   Telephone follow up appointment with care management team member scheduled for:10/01/20 2pm  [place] Management Coordinator Direct Dial:  [phone]  Fax: [phone]

## 2020-09-11 NOTE — Chronic Care Management (AMB) (Signed)
Care Management    RN Visit Note  09/11/2020 Name: Sharon Blankenship MRN: [id number] DOB: October 27, 1965  Subjective: Sharon Blankenship is a 55 y.o. year old female who is a primary care patient of Sharon Beard, MD. The care management team was consulted for assistance with disease management and care coordination needs.    Engaged with patient by telephone for initial visit in response to provider referral for case management and/or care coordination services.   Consent to Services:   [name] was given information about Care Management services today including:  Care Management services includes personalized support from designated clinical staff supervised by her physician, including individualized plan of care and coordination with other care providers 24/7 contact phone numbers for assistance for urgent and routine care needs. The patient may stop case management services at any time by phone call to the office staff.  Patient agreed to services and consent obtained.   Assessment: Review of patient past medical history, allergies, medications, health status, including review of consultants reports, laboratory and other test data, was performed as part of comprehensive evaluation and provision of chronic care management services.   SDOH (Social Determinants of Health) assessments and interventions performed:    Care Plan  Allergies  Allergen Reactions   Aloe Shortness Of Breath and Rash    SOB, Swelling, Rash, Itching   Penicillins Hives, Itching and Swelling    Did it involve swelling of the face/tongue/throat, SOB, or low BP? Y Did it involve sudden or severe rash/hives, skin peeling, or any reaction on the inside of your mouth or nose? N Did you need to seek medical attention at a hospital or doctor's office? Y When did it last happen? Several Years Ago       If all above answers are "NO", may proceed with cephalosporin use.     Outpatient Encounter Medications as of 09/11/2020   Medication Sig Note   aspirin 81 MG EC tablet Take 1 tablet (81 mg total) by mouth daily. Swallow whole. (Patient taking differently: Take 81 mg by mouth daily.)    atorvastatin (LIPITOR) 40 MG tablet Take 1 tablet (40 mg total) by mouth daily. IM program    atropine 1 % ophthalmic solution Place 1 drop into the right eye daily.    ferrous sulfate 325 (65 FE) MG tablet Take 1 tablet (325 mg total) by mouth daily. 09/11/2020: Taking differently; not taking every day secondary to constipation   gabapentin (NEURONTIN) 100 MG capsule Take 1 capsule (100 mg total) by mouth 3 (three) times daily as needed (nerve pain).    glucose blood test strip Use as instructed    Insulin Pen Needle 32G X 4 MM MISC USE AS DIRECTED TO INJECT DIABETES MEDICATION 5 TIMES DAILY.    loratadine (CLARITIN) 10 MG tablet Take 1 tablet (10 mg total) by mouth daily as needed for allergies.    losartan (COZAAR) 100 MG tablet Take 1 tablet (100 mg total) by mouth daily.    montelukast (SINGULAIR) 10 MG tablet TAKE 1 TABLET (10 MG TOTAL) BY MOUTH DAILY.    Multiple Vitamin (MULTIVITAMIN PO) Take 1 tablet by mouth daily.    pantoprazole (PROTONIX) 40 MG tablet Take 1 tablet (40 mg total) by mouth daily. 09/11/2020: Taking differently; does not take daily   prednisoLONE acetate (PRED FORTE) 1 % ophthalmic suspension Place 1 drop into the right eye 4 (four) times daily.    ticagrelor (BRILINTA) 90 MG TABS tablet Take 1 tablet (90 mg  total) by mouth 2 (two) times daily.    acetaZOLAMIDE (DIAMOX) 125 MG tablet Take 2 tablets (250 mg total) by mouth in the morning, at noon, in the evening, and at bedtime. (Patient not taking: No sig reported)    azelastine (ASTELIN) 0.1 % nasal spray Place 1 spray into both nostrils 2 (two) times daily as needed for allergies.    Blood Glucose Monitoring Suppl (CONTOUR NEXT MONITOR) w/Device KIT Use as directed    brimonidine (ALPHAGAN) 0.2 % ophthalmic solution Place 1 drop into both eyes 3 (three) times  daily. (Patient not taking: Reported on 08/26/2020)    dorzolamide-timolol (COSOPT) 22.3-6.8 MG/ML ophthalmic solution Place 1 drop into the right eye 2 (two) times daily.    insulin aspart (NOVOLOG FLEXPEN) 100 UNIT/ML FlexPen Inject 18 Units into the skin 3 (three) times daily with meals. (Patient not taking: No sig reported)    insulin degludec (TRESIBA FLEXTOUCH) 100 UNIT/ML FlexTouch Pen Inject 70 Units into the skin daily. This is a patient assistance medication, patient may not have, please ask when performing med rec (Patient not taking: No sig reported)    insulin glargine (LANTUS SOLOSTAR) 100 UNIT/ML Solostar Pen Inject 76 Units into the skin daily.    latanoprost (XALATAN) 0.005 % ophthalmic solution Place 1 drop into both eyes at bedtime. (Patient not taking: No sig reported)    liraglutide (VICTOZA) 18 MG/3ML SOPN Inject 1.8 mg into the skin daily for 7 days.    meloxicam (MOBIC) 15 MG tablet Take 1 tablet (15 mg total) by mouth daily as needed for pain. (Patient not taking: No sig reported)    metFORMIN (GLUCOPHAGE) 1000 MG tablet Take 1 tablet (1,000 mg total) by mouth 2 (two) times daily with a meal.    methocarbamol (ROBAXIN) 500 MG tablet Take 500 mg by mouth 3 (three) times daily as needed for muscle spasms. (Patient not taking: No sig reported)    Microlet Lancets MISC Use as directed    moxifloxacin (VIGAMOX) 0.5 % ophthalmic solution Place 1 drop into the right eye 4 times daily.    neomycin-polymyxin-dexameth (MAXITROL) 0.1 % OINT Place 1 application into the right eye nightly. (Patient not taking: Reported on 09/11/2020)    polyethylene glycol (MIRALAX / GLYCOLAX) packet Take 17 g by mouth daily as needed for mild constipation. (Patient not taking: No sig reported) 09/11/2020: Has on hand   Semaglutide, 1 MG/DOSE, (OZEMPIC, 1 MG/DOSE,) 4 MG/3ML SOPN Inject 1 mg into the skin once a week. This is a patient assistance medication, patient may not have, please ask when performing med  rec (Patient not taking: No sig reported)    No facility-administered encounter medications on file as of 09/11/2020.    Patient Active Problem List   Diagnosis Date Noted   Iron deficiency anemia 08/07/2020   Middle cerebral artery stenosis, left 07/28/2020   Mild concentric left ventricular hypertrophy (LVH) 07/24/2020   Stroke ([place]) 07/23/2020   Constipation 07/17/2020   GERD (gastroesophageal reflux disease) 06/06/2020   Healthcare maintenance 06/06/2020   High anion gap metabolic acidosis 62/22/9798   Sinus tachycardia 05/10/2020   Secondary open-angle glaucoma of both eyes, severe stage 04/25/2020   Hypertension 04/25/2020   DM (diabetes mellitus), type 2, uncontrolled with complications ([place]) 92/02/9416   Hyperlipidemia 04/25/2020   CVA (cerebral vascular accident) ([place]) 03/21/2020    Conditions to be addressed/monitored: DMII and CVA  Care Plan : Diabetes Type 2 (Adult)  Updates made by Clerance Lav, RN since 09/11/2020  12:00 AM     Problem: Glycemic Management (Diabetes, Type 2)      Long-Range Goal: Glycemic Management Optimized   Start Date: 09/11/2020  Expected End Date: 12/10/2020  This Visit's Progress: On track  Priority: High  Note:   Objective:  Lab Results  Component Value Date   HGBA1C 7.7 (H) 07/24/2020   Lab Results  Component Value Date   CREATININE 1.33 (H) 07/30/2020   CREATININE 1.45 (H) 07/29/2020   CREATININE 1.03 (H) 07/28/2020   Lab Results  Component Value Date   EGFR 52 (L) 07/15/2020   Current Barriers:  Knowledge Deficits related to medications used for management of diabetes - patient working closely with pharmacist for adjustment of Lantus insulin (now taking 63u daily - down from 80u daily) and RNCM for ongoing monitoring, symptom management Unable to independently manage needed changes to diabetes regimen without assistance from clinical team Case Manager Clinical Goal(s):  patient will demonstrate improved adherence to  prescribed treatment plan for diabetes self care/management as evidenced by:  daily monitoring and recording of CBG  adherence to ADA/ carb modified diet adherence to prescribed medication regimen contacting provider for new or worsened symptoms or questions Interventions:  Collaboration with Sharon Beard, MD regarding development and update of comprehensive plan of care as evidenced by provider attestation and co-signature Inter-disciplinary care team collaboration (see longitudinal plan of care) Provided education to patient about basic DM disease process Reviewed medications with patient and discussed importance of medication adherence Discussed plans with patient for ongoing care management follow up and provided patient with direct contact information for care management team Provided patient with written educational materials related to hypo and hyperglycemia and importance of correct treatment Reviewed scheduled/upcoming provider appointments including: upcoming call from pharmacist Advised patient, providing education and rationale, to check cbg BID and prn and record, calling PCP, Pharmacist, or RNCM for findings outside established parameters.   Referral made to community resources care guide team for assistance with transportation to appointments Review of patient status, including review of consultants reports, relevant laboratory and other test results, and medications completed. Talked with patient at length about the importance of balance in diet, difference between quality of carbohydrates, portion sizes, balanced night time snack Self-Care Activities - UNABLE to independently drive to appointments - sister and church friends are helping Patient Goals: - check blood sugar at prescribed times - check blood sugar if I feel it is too high or too low - enter blood sugar readings and medication or insulin into daily log - take the blood sugar log to all doctor visits - manage  portion size  - keep appointment with eye doctor  Follow Up Plan: Telephone follow up appointment with care management team member scheduled for: June 29 2pm     Care Plan : Stroke (Adult)  Updates made by Clerance Lav, RN since 09/11/2020 12:00 AM     Problem: Recurrence (CVA x 2 - December/April)   Priority: High     Long-Range Goal: Stroke Recurrence Prevented or Minimized   Start Date: 09/11/2020  Expected End Date: 11/10/2020  This Visit's Progress: On track  Priority: Medium  Note:   Current Barriers:  Self Health Maintenance related to recent CVA - patient has had 2 strokes in the last 6 months; she is seeing her primary care provider routinely; being [facility]mology [facility] although for Glaucoma rather than vision changes resulting from Stroke; taking medications as prescribed; checking bp twice weekly Unable to independently  monitor as a high recurrent stroke risk patient; needs ongoing close monitoring at primary care level Clinical Goal(s):  Collaboration with Sharon Beard, MD regarding development and update of comprehensive plan of care as evidenced by provider attestation and co-signature Inter-disciplinary care team collaboration (see longitudinal plan of care) patient will work with care management team to address care coordination and chronic disease management needs related to Disease Management Educational Needs - provided high level assessment and overview of stroke symptoms and management; advised to maintain provider appointments and call for new/worsening symptoms Care Coordination - community resources care guide actively assisting patient with transportation resources and Meals on Wheels application Medication Assistance - [facility] [employer] assisting with medication needs Interventions:  Evaluation of current treatment plan related to  CVA , Financial constraints related to Medicaid Status, food and Transportation self-management and patient's  adherence to plan as established by provider. Provided education re: close monitoring of symptoms and reporting as new symptoms or concerns are noted Collaboration with Sharon Beard, MD regarding development and update of comprehensive plan of care as evidenced by provider attestation       and co-signaturePatient verbalizes understanding of plan to work with care management team Inter-disciplinary care team collaboration (see longitudinal plan of care) Discussed plans with patient for ongoing care management follow up and provided patient with direct contact information for care management team Self Care Activities:  Patient verbalizes understanding of plan to work with care management team in collaboration with PCP for chronic health needs Self administers medications as prescribed Attends all scheduled provider appointments Calls provider office for new concerns or questions Patient Goals: - eat healthy to increase strength - start a journal or diary to track progress Follow Up Plan: Telephone follow up appointment with care management team member scheduled for:10/01/20 2pm      Plan: Telephone follow up appointment with care management team member scheduled for:  10/01/20 2pm  Janalyn Shy [place] Management Coordinator Direct Dial:  [phone]  Fax: [phone]

## 2020-09-11 NOTE — Chronic Care Management (AMB) (Deleted)
Care Management   Pharmacy Note  09/11/2020 Name: Sharon Blankenship MRN: [id number] DOB: Dec 22, 1965  Subjective: Sharon Blankenship is a 55 y.o. year old female who is a primary care patient of [name], MD. The Care Management team was consulted for assistance with care management and care coordination needs.    Engaged with patient by telephone for initial visit in response to provider referral for pharmacy case management and/or care coordination services.   The patient was given information about Care Management services today including:  Care Management services includes personalized support from designated clinical staff supervised by the patient's primary care provider, including individualized plan of care and coordination with other care providers. 24/7 contact phone numbers for assistance for urgent and routine care needs. The patient may stop case management services at any time by phone call to the office staff.  Patient agreed to services and consent obtained.  Assessment:  Review of patient status, including review of consultants reports, laboratory and other test data, was performed as part of comprehensive evaluation and provision of chronic care management services.   SDOH (Social Determinants of Health) assessments and interventions performed:    Objective:  Lab Results  Component Value Date   CREATININE 1.33 (H) 07/30/2020   CREATININE 1.45 (H) 07/29/2020   CREATININE 1.03 (H) 07/28/2020    Lab Results  Component Value Date   HGBA1C 7.7 (H) 07/24/2020       Component Value Date/Time   CHOL 108 07/24/2020 0155   CHOL 133 04/24/2020 1501   TRIG 113 07/24/2020 0155   HDL 24 (L) 07/24/2020 0155   HDL 39 (L) 04/24/2020 1501   CHOLHDL 4.5 07/24/2020 0155   VLDL 23 07/24/2020 0155   LDLCALC 61 07/24/2020 0155   LDLCALC 74 04/24/2020 1501    Other: (TSH, CBC, Vit D, etc.)  Clinical ASCVD: {YES/NO:21197} The ASCVD Risk score Mikey Bussing DC Jr., et al., 2013) failed  to calculate for the following reasons:   The patient has a prior MI or stroke diagnosis    Other: (CHADS2VASc if Afib, PHQ9 if depression, MMRC or CAT for COPD, ACT, DEXA)  BP Readings from Last 3 Encounters:  08/26/20 130/85  08/07/20 114/75  07/30/20 (!) 155/84    Care Plan  Allergies  Allergen Reactions   Aloe Shortness Of Breath and Rash    SOB, Swelling, Rash, Itching   Penicillins Hives, Itching and Swelling    Did it involve swelling of the face/tongue/throat, SOB, or low BP? Y Did it involve sudden or severe rash/hives, skin peeling, or any reaction on the inside of your mouth or nose? N Did you need to seek medical attention at a hospital or doctor's office? Y When did it last happen? Several Years Ago       If all above answers are "NO", may proceed with cephalosporin use.     Medications Reviewed Today     Reviewed by Clerance Lav, RN (Registered Nurse) on 09/11/20 at 92  Med List Status: <None>   Medication Order Taking? Sig Documenting Provider Last Dose Status Informant  acetaZOLAMIDE (DIAMOX) 125 MG tablet [id number] No Take 2 tablets (250 mg total) by mouth in the morning, at noon, in the evening, and at bedtime.  Patient not taking: No sig reported   Mitzi Hansen, MD Not Taking Active   aspirin 81 MG EC tablet [id number] Yes Take 1 tablet (81 mg total) by mouth daily. Swallow whole.  Patient taking differently: Take 81 mg by mouth daily.  Mitzi Hansen, MD Taking Active   atorvastatin (LIPITOR) 40 MG tablet [id number] Yes Take 1 tablet (40 mg total) by mouth daily. IM program Andrew Au, MD Taking Active   atropine 1 % ophthalmic solution [id number] Yes Place 1 drop into the right eye daily. [provider] Taking Active   azelastine (ASTELIN) 0.1 % nasal spray [id number]  Place 1 spray into both nostrils 2 (two) times daily as needed for allergies. [provider]  Active Self  Blood Glucose Monitoring Suppl (CONTOUR NEXT  MONITOR) w/Device KIT [id number]  Use as directed Andrew Au, MD  Active   brimonidine [facility]) 0.2 % ophthalmic solution [id number]  Place 1 drop into both eyes 3 (three) times daily.  Patient not taking: Reported on 08/26/2020   Andrew Au, MD  Active   dorzolamide-timolol (COSOPT) 22.3-6.8 MG/ML ophthalmic solution [id number]  Place 1 drop into the right eye 2 (two) times daily. Andrew Au, MD  Active   ferrous sulfate 325 (65 FE) MG tablet [id number] Yes Take 1 tablet (325 mg total) by mouth daily. Andrew Au, MD Taking Active            Med Note Lourena Simmonds Sep 11, 2020  2:44 PM) Taking differently; not taking every day secondary to constipation  gabapentin (NEURONTIN) 100 MG capsule [id number] Yes Take 1 capsule (100 mg total) by mouth 3 (three) times daily as needed (nerve pain). Andrew Au, MD Taking Active   glucose blood test strip [id number] Yes Use as instructed Andrew Au, MD Taking Active   insulin aspart (NOVOLOG FLEXPEN) 100 UNIT/ML FlexPen [id number]  Inject 18 Units into the skin 3 (three) times daily with meals.  Patient not taking: No sig reported   Andrew Au, MD  Expired 09/06/20 2359   insulin degludec (TRESIBA FLEXTOUCH) 100 UNIT/ML FlexTouch Pen [id number] No Inject 70 Units into the skin daily. This is a patient assistance medication, patient may not have, please ask when performing med rec  Patient not taking: No sig reported   Andrew Au, MD Not Taking Active   insulin glargine (LANTUS SOLOSTAR) 100 UNIT/ML Solostar Pen [id number]  Inject 76 Units into the skin daily. Andrew Au, MD  Expired 09/08/20 2359   Insulin Pen Needle 32G X 4 MM MISC [id number] Yes USE AS DIRECTED TO INJECT DIABETES MEDICATION 5 TIMES DAILY. Andrew Au, MD Taking Active   latanoprost (XALATAN) 0.005 % ophthalmic solution [id number] No Place 1 drop into both eyes at bedtime.  Patient not taking: No sig reported   Andrew Au, MD Not Taking Active    liraglutide (VICTOZA) 18 MG/3ML SOPN [id number]  Inject 1.8 mg into the skin daily for 7 days. Andrew Au, MD  Expired 09/02/20 2359   loratadine (CLARITIN) 10 MG tablet [id number] Yes Take 1 tablet (10 mg total) by mouth daily as needed for allergies. Andrew Au, MD Taking Active   losartan (COZAAR) 100 MG tablet [id number] Yes Take 1 tablet (100 mg total) by mouth daily. Andrew Au, MD Taking Active   meloxicam [facility]) 15 MG tablet [id number] No Take 1 tablet (15 mg total) by mouth daily as needed for pain.  Patient not taking: No sig reported   Andrew Au, MD Not Taking Active   metFORMIN (GLUCOPHAGE) 1000 MG tablet [id number]  Take 1 tablet (1,000 mg total) by mouth 2 (two) times daily with a meal. Bridgett Larsson Mauri Reading, MD  Active   methocarbamol (ROBAXIN) 500 MG tablet [id number] No Take 500 mg by mouth 3 (three) times daily as needed for muscle spasms.  Patient not taking: No sig reported   [provider] Not Taking Active            Med Note Thayer Ohm, Gershon Cull Jul 23, 2020 11:35 AM)    Microlet Lancets MISC [id number]  Use as directed Andrew Au, MD  Active   montelukast (SINGULAIR) 10 MG tablet [id number] Yes TAKE 1 TABLET (10 MG TOTAL) BY MOUTH DAILY. Andrew Au, MD Taking Active   moxifloxacin (VIGAMOX) 0.5 % ophthalmic solution [id number]  Place 1 drop into the right eye 4 times daily. [provider]  Active   Multiple Vitamin (MULTIVITAMIN PO) [id number] Yes Take 1 tablet by mouth daily. [provider] Taking Active Self  neomycin-polymyxin-dexameth (MAXITROL) 0.1 % OINT [id number] No Place 1 application into the right eye nightly.  Patient not taking: Reported on 09/11/2020   [provider] Not Taking Active   pantoprazole (PROTONIX) 40 MG tablet [id number] Yes Take 1 tablet (40 mg total) by mouth daily. Andrew Au, MD Taking Active            Med Note Lourena Simmonds Sep 11, 2020  2:46 PM) Taking differently; does not take  daily  polyethylene glycol (MIRALAX / GLYCOLAX) packet [id number] No Take 17 g by mouth daily as needed for mild constipation.  Patient not taking: No sig reported   [provider] Not Taking Active            Med Note Lourena Simmonds Sep 11, 2020  2:47 PM) Has on hand  prednisoLONE acetate (PRED FORTE) 1 % ophthalmic suspension [id number] Yes Place 1 drop into the right eye 4 (four) times daily. Andrew Au, MD Taking Active   Semaglutide, 1 MG/DOSE, (OZEMPIC, 1 MG/DOSE,) 4 MG/3ML SOPN [id number] No Inject 1 mg into the skin once a week. This is a patient assistance medication, patient may not have, please ask when performing med rec  Patient not taking: No sig reported   Andrew Au, MD Not Taking Active   ticagrelor (BRILINTA) 90 MG TABS tablet [id number] Yes Take 1 tablet (90 mg total) by mouth 2 (two) times daily. Andrew Au, MD Taking Active   Med List Note Megan Salon 05/08/20 1445):                 Patient Active Problem List   Diagnosis Date Noted   Iron deficiency anemia 08/07/2020   Middle cerebral artery stenosis, left 07/28/2020   Mild concentric left ventricular hypertrophy (LVH) 07/24/2020   Stroke ([place]) 07/23/2020   Constipation 07/17/2020   GERD (gastroesophageal reflux disease) 06/06/2020   Healthcare maintenance 06/06/2020   High anion gap metabolic acidosis 00/37/0488   Sinus tachycardia 05/10/2020   Secondary open-angle glaucoma of both eyes, severe stage 04/25/2020   Hypertension 04/25/2020   DM (diabetes mellitus), type 2, uncontrolled with complications ([place]) 89/16/9450   Hyperlipidemia 04/25/2020   CVA (cerebral vascular accident) ([place]) 03/21/2020    Conditions to be addressed/monitored: DMII and CVA  Care Plan : Diabetes Type 2 (Adult)  Updates made by Clerance Lav, RN since 09/11/2020 12:00 AM     Problem: Glycemic Management (Diabetes, Type 2)      Long-Range Goal: Glycemic Management Optimized   Start Date:  09/11/2020  Expected End Date:  12/10/2020  This Visit's Progress: On track  Priority: High  Note:   Objective:  Lab Results  Component Value Date   HGBA1C 7.7 (H) 07/24/2020   Lab Results  Component Value Date   CREATININE 1.33 (H) 07/30/2020   CREATININE 1.45 (H) 07/29/2020   CREATININE 1.03 (H) 07/28/2020   Lab Results  Component Value Date   EGFR 52 (L) 07/15/2020   Current Barriers:  Knowledge Deficits related to medications used for management of diabetes - patient working closely with pharmacist for adjustment of Lantus insulin (now taking 63u daily - down from 80u daily) and RNCM for ongoing monitoring, symptom management Unable to independently manage needed changes to diabetes regimen without assistance from clinical team Case Manager Clinical Goal(s):  patient will demonstrate improved adherence to prescribed treatment plan for diabetes self care/management as evidenced by:  daily monitoring and recording of CBG  adherence to ADA/ carb modified diet adherence to prescribed medication regimen contacting provider for new or worsened symptoms or questions Interventions:  Collaboration with [name], MD regarding development and update of comprehensive plan of care as evidenced by provider attestation and co-signature Inter-disciplinary care team collaboration (see longitudinal plan of care) Provided education to patient about basic DM disease process Reviewed medications with patient and discussed importance of medication adherence Discussed plans with patient for ongoing care management follow up and provided patient with direct contact information for care management team Provided patient with written educational materials related to hypo and hyperglycemia and importance of correct treatment Reviewed scheduled/upcoming provider appointments including: upcoming call from pharmacist Advised patient, providing education and rationale, to check cbg BID and prn and record,  calling PCP, Pharmacist, or RNCM for findings outside established parameters.   Referral made to community resources care guide team for assistance with transportation to appointments Review of patient status, including review of consultants reports, relevant laboratory and other test results, and medications completed. Talked with patient at length about the importance of balance in diet, difference between quality of carbohydrates, portion sizes, balanced night time snack Self-Care Activities - UNABLE to independently drive to appointments - sister and church friends are helping Patient Goals: - check blood sugar at prescribed times - check blood sugar if I feel it is too high or too low - enter blood sugar readings and medication or insulin into daily log - take the blood sugar log to all doctor visits - manage portion size  - keep appointment with eye doctor  Follow Up Plan: Telephone follow up appointment with care management team member scheduled for: June 29 2pm     Care Plan : Stroke (Adult)  Updates made by Clerance Lav, RN since 09/11/2020 12:00 AM     Problem: Recurrence (CVA x 2 - December/April)   Priority: High     Long-Range Goal: Stroke Recurrence Prevented or Minimized   Start Date: 09/11/2020  Expected End Date: 11/10/2020  This Visit's Progress: On track  Priority: Medium  Note:   Current Barriers:  Self Health Maintenance related to recent CVA - patient has had 2 strokes in the last 6 months; she is seeing her primary care provider routinely; being [facility] although for Glaucoma rather than vision changes resulting from Stroke; taking medications as prescribed; checking bp twice weekly Unable to independently monitor as a high recurrent stroke risk patient; needs ongoing close monitoring at primary care level Clinical Goal(s):  Collaboration with [name], MD regarding development and update of comprehensive plan of  care as  evidenced by provider attestation and co-signature Inter-disciplinary care team collaboration (see longitudinal plan of care) patient will work with care management team to address care coordination and chronic disease management needs related to Disease Management Educational Needs - provided high level assessment and overview of stroke symptoms and management; advised to maintain provider appointments and call for new/worsening symptoms Care Coordination - community resources care guide actively assisting patient with transportation resources and Meals on Wheels application Medication Assistance - [facility] [employer] assisting with medication needs Interventions:  Evaluation of current treatment plan related to  CVA , Financial constraints related to Medicaid Status, food and Transportation self-management and patient's adherence to plan as established by provider. Provided education re: close monitoring of symptoms and reporting as new symptoms or concerns are noted Collaboration with [name], MD regarding development and update of comprehensive plan of care as evidenced by provider attestation       and co-signaturePatient verbalizes understanding of plan to work with care management team Inter-disciplinary care team collaboration (see longitudinal plan of care) Discussed plans with patient for ongoing care management follow up and provided patient with direct contact information for care management team Self Care Activities:  Patient verbalizes understanding of plan to work with care management team in collaboration with PCP for chronic health needs Self administers medications as prescribed Attends all scheduled provider appointments Calls provider office for new concerns or questions Patient Goals: - eat healthy to increase strength - start a journal or diary to track progress Follow Up Plan: Telephone follow up appointment with care management team member scheduled for:10/01/20  2pm      Medication Assistance:  Application for medication assistance program in process (being addressed by [facility] [employer]).  See plan of care for additional detail.  Follow Up:  Patient agrees to Care Plan and Follow-up.  Plan: Telephone follow up appointment with care management team member scheduled for:  10/01/20 2pm  [place] Management Coordinator Direct Dial:  [phone]  Fax: [phone]

## 2020-09-11 NOTE — Telephone Encounter (Signed)
Unable to reach patient to discuss blood glucose readings. Left HIPAA compliant voicemail requesting call back

## 2020-09-12 ENCOUNTER — Other Ambulatory Visit: Payer: Self-pay

## 2020-09-12 ENCOUNTER — Telehealth: Payer: Self-pay | Admitting: Pharmacist

## 2020-09-12 ENCOUNTER — Telehealth: Payer: Self-pay | Admitting: Student

## 2020-09-12 MED ORDER — OZEMPIC (1 MG/DOSE) 4 MG/3ML ~~LOC~~ SOPN
1.0000 mg | PEN_INJECTOR | SUBCUTANEOUS | 0 refills | Status: AC
  Filled 2020-09-12: qty 3, 28d supply, fill #0

## 2020-09-12 NOTE — Telephone Encounter (Signed)
Follow-up to review blood glucose readings.  Patient reports that her blood glucose has been low so she has been holding off on her Fiasp and moved up her Lantus to the morning to hopefully help.  She reports fasting blood glucose is typically in the 90's and the lowest was 72. Her highest was this morning at 170 but she states "that is because I ate something last night before bed to hopefully help keep it up." She reports waking up around 3AM about 3x over past two weeks due to feeling "funny" and dizzy which she reports is when she saw the readings in the 70's.  She reports her postprandial blood glucose is typically in the 110-120's. She had one reading of 200 but reports this was due to eating a carb heavy meal and ice cream when visiting her sister.   Current regimen:  Lantus 63 units once daily Victoza 1.8mg  once daily  Patient mentions she just ran out of Victoza. She is approved through PAP for Ozempic but shipment has not yet arrived. Patient previously tolerating Ozempic 1mg  before being switched to Victoza 1.8mg  due to coverage issue (self-pay). Will switch patient back to Ozempic ahead of shipment that is expected to arrive likely next week. [facility]  Instructed patient to decrease her Lantus dose to 55 units (12.6% decrease) once daily. Patient verbalized understanding of plan and proper hypoglycemia management.

## 2020-09-12 NOTE — Telephone Encounter (Signed)
.    Telephone encounter was:  Successful.  09/12/2020 Name: Sharon Blankenship MRN: [id number] DOB: 1965/07/17  Sharon Blankenship is a 55 y.o. year old female who is a primary care patient of [name], MD . The community resource team was consulted for assistance with Transportation Needs  and information on the [facility] guide performed the following interventions: Discussed resources to assist with the patient bringing the paperwork for ACCESS transportation, she also asked about the [facility] card and how it works, waiting on [employer] and Disability. Answered her questions and she knows where to go to turn in the paperwork .  Follow Up Plan:  No further follow up planned at this time. The patient has been provided with needed resources.  [place], Care Management Phone: [phone] Email: [email or web address]

## 2020-09-15 ENCOUNTER — Other Ambulatory Visit: Payer: Self-pay

## 2020-09-18 NOTE — Progress Notes (Signed)
Submitted application for BRILINTA to [employer] for patient assistance.   Phone: [phone]

## 2020-09-26 ENCOUNTER — Other Ambulatory Visit: Payer: Self-pay

## 2020-09-26 ENCOUNTER — Telehealth (INDEPENDENT_AMBULATORY_CARE_PROVIDER_SITE_OTHER): Payer: Medicaid Other | Admitting: Pharmacist

## 2020-09-26 DIAGNOSIS — E118 Type 2 diabetes mellitus with unspecified complications: Secondary | ICD-10-CM

## 2020-09-26 DIAGNOSIS — IMO0002 Reserved for concepts with insufficient information to code with codable children: Secondary | ICD-10-CM

## 2020-09-26 DIAGNOSIS — E1165 Type 2 diabetes mellitus with hyperglycemia: Secondary | ICD-10-CM

## 2020-09-26 MED ORDER — ROSUVASTATIN CALCIUM 20 MG PO TABS
20.0000 mg | ORAL_TABLET | Freq: Every day | ORAL | 0 refills | Status: AC
  Filled 2020-09-26: qty 30, 30d supply, fill #0
  Filled 2020-10-22: qty 30, 30d supply, fill #1
  Filled 2020-12-02: qty 30, 30d supply, fill #2

## 2020-09-26 NOTE — Progress Notes (Signed)
Subjective:    Patient ID: Sharon Blankenship, female    DOB: Apr 01, 1966, 55 y.o.   MRN: [id number]  HPI Patient is a 55 y.o. female who presents for diabetes management via MyChart Video Visit. Patient states she did not receive link despite sending it several times while on the telephone with patient therefore visit conducted via telephone. Patient was referred on 08/07/20 and last seen by Primary Care Provider on 08/26/20.  Patient reports that her blood sugars have been "pretty okay now." She moved Lantus to noon time administration. She reports not having to take her [employer] unless you eat out at her sisters or goes out to eat. Re-started Ozempic 1mg  on June 13th and has completed two doses with no reported side effects.  Insurance coverage/medication affordability: self-pay  Current diabetes medications include: insulin glargine (Lantus) 56 units once daily, insulin aspart (Fiasp) 18 units if she takes it, semaglutide (Ozempic) 1mg  Current hypertension medications include: losartan 100mg  Current hyperlipidemia medications include: atorvastatin 40mg ; Patient reports that since increasing dose of atorvastatin from 20 to 40mg  she has been experiencing muscle pain.  Patient states that She is taking her diabetes medications as prescribed. Patient reports adherence with medications.   Patient denies hypoglycemic events. Patient denies polyuria (increased urination).  Patient denies polyphagia (increased appetite).  Patient denies polydipsia (increased thirst).  Patient denies neuropathy (nerve pain). Patient reports visual changes but she is being followed by eye doctor for this post-stroke. Patient reports self foot exams.   Home fasting blood sugars: 113, 124, 73, 143, 94, 143 2 hour post-meal/random blood sugars: 192  Objective:   Labs:    Lab Results  Component Value Date   HGBA1C 7.7 (H) 07/24/2020   HGBA1C 7.0 (A) 06/05/2020   HGBA1C 6.3 (H) 03/22/2020    Lipid Panel      Component Value Date/Time   CHOL 108 07/24/2020 0155   CHOL 133 04/24/2020 1501   TRIG 113 07/24/2020 0155   HDL 24 (L) 07/24/2020 0155   HDL 39 (L) 04/24/2020 1501   CHOLHDL 4.5 07/24/2020 0155   VLDL 23 07/24/2020 0155   LDLCALC 61 07/24/2020 0155   LDLCALC 74 04/24/2020 1501    Clinical Atherosclerotic Cardiovascular Disease (ASCVD): Yes  The ASCVD Risk score Mikey Bussing DC Jr., et al., 2013) failed to calculate for the following reasons:   The patient has a prior MI or stroke diagnosis   Assessment/Plan:   T2DM is controlled. Medication adherence appears optimal. Additional pharmacotherapy is not warranted at this time but can consider increasing Ozempic at follow-up visit to back down off of more insulin. Following instruction patient verbalized understanding of treatment plan.    Continued basal insulin glargine (Lantus) 56 units once daily. Continued  rapid insulin apart (Fiasp) 18 units with large meals.  Continued GLP-1 semaglutide (Ozempic) 1mg  once weekly on Mondays.  Extensively discussed pathophysiology of diabetes, dietary effects on blood sugar control, and recommended lifestyle interventions.  Counseled on s/sx of and management of hypoglycemia Next A1C anticipated July 2022.   ASCVD risk - secondary prevention in patient with diabetes. Last LDL is controlled. Aspirin is indicated. Will switch high intensity atorvastatin 40mg  to high intensity rosuvastatin 20mg  due to complaints of muscle aches.  Continued aspirin 81 mg  Discontinued atorvastatin 40 mg.  Initiated rosuvastatin 20mg   Follow-up appointment four weeks to review sugar readings and check A1C. Written patient instructions provided.  This appointment required 30 minutes of patient care (this includes precharting, chart review, review of results,  and face-to-face care).  Thank you for involving pharmacy to assist in providing this patient's care.

## 2020-09-29 ENCOUNTER — Other Ambulatory Visit: Payer: Self-pay

## 2020-09-29 ENCOUNTER — Other Ambulatory Visit (HOSPITAL_COMMUNITY): Payer: Self-pay

## 2020-09-29 ENCOUNTER — Other Ambulatory Visit (HOSPITAL_BASED_OUTPATIENT_CLINIC_OR_DEPARTMENT_OTHER): Payer: Self-pay

## 2020-10-01 ENCOUNTER — Ambulatory Visit: Payer: Medicaid Other

## 2020-10-01 DIAGNOSIS — E113413 Type 2 diabetes mellitus with severe nonproliferative diabetic retinopathy with macular edema, bilateral: Secondary | ICD-10-CM

## 2020-10-01 DIAGNOSIS — I639 Cerebral infarction, unspecified: Secondary | ICD-10-CM

## 2020-10-01 NOTE — Chronic Care Management (AMB) (Signed)
Care Management    RN Visit Note  10/01/2020 Name: Sharon Blankenship MRN: [id number] DOB: 10-12-1965  Subjective: Sharon Blankenship is a 55 y.o. year old female who is a primary care patient of [name], MD. The care management team was consulted for assistance with disease management and care coordination needs.    Engaged with patient by telephone for follow up visit in response to provider referral for case management and/or care coordination services.   Consent to Services:   Sharon Blankenship was given information about Care Management services today including:  Care Management services includes personalized support from designated clinical staff supervised by her physician, including individualized plan of care and coordination with other care providers 24/7 contact phone numbers for assistance for urgent and routine care needs. The patient may stop case management services at any time by phone call to the office staff.  Patient agreed to services and consent obtained.    Assessment: Patient is currently experiencing difficulty with stomach issues without fever, vomiting.. See Care Plan below for interventions and patient self-care actives. Follow up Plan: Patient would like continued follow-up.  CCM RNCM will outreach the patient within the next week  Patient will call office if needed prior to next encounter  Review of patient past medical history, allergies, medications, health status, including review of consultants reports, laboratory and other test data, was performed as part of comprehensive evaluation and provision of chronic care management services.   SDOH (Social Determinants of Health) assessments and interventions performed:    Care Plan  Allergies  Allergen Reactions   Aloe Shortness Of Breath and Rash    SOB, Swelling, Rash, Itching   Penicillins Hives, Itching and Swelling    Did it involve swelling of the face/tongue/throat, SOB, or low BP? Y Did it involve sudden or  severe rash/hives, skin peeling, or any reaction on the inside of your mouth or nose? N Did you need to seek medical attention at a hospital or doctor's office? Y When did it last happen? Several Years Ago       If all above answers are "NO", may proceed with cephalosporin use.     Outpatient Encounter Medications as of 10/01/2020  Medication Sig Note   acetaZOLAMIDE (DIAMOX) 125 MG tablet Take 2 tablets (250 mg total) by mouth in the morning, at noon, in the evening, and at bedtime. (Patient not taking: No sig reported)    aspirin 81 MG EC tablet Take 1 tablet (81 mg total) by mouth daily. Swallow whole. (Patient taking differently: Take 81 mg by mouth daily.)    atropine 1 % ophthalmic solution Place 1 drop into the right eye daily.    azelastine (ASTELIN) 0.1 % nasal spray Place 1 spray into both nostrils 2 (two) times daily as needed for allergies.    b complex vitamins capsule Take 1 capsule by mouth daily.    Blood Glucose Monitoring Suppl (CONTOUR NEXT MONITOR) w/Device KIT Use as directed    brimonidine (ALPHAGAN) 0.2 % ophthalmic solution Place 1 drop into both eyes 3 (three) times daily. (Patient not taking: Reported on 08/26/2020)    dorzolamide-timolol (COSOPT) 22.3-6.8 MG/ML ophthalmic solution Place 1 drop into the right eye 2 (two) times daily.    ferrous sulfate 325 (65 FE) MG tablet Take 1 tablet (325 mg total) by mouth daily. 09/11/2020: Taking differently; not taking every day secondary to constipation   gabapentin (NEURONTIN) 100 MG capsule Take 1 capsule (100 mg total) by mouth 3 (three)  times daily as needed (nerve pain).    glucose blood test strip Use as instructed    insulin aspart (NOVOLOG FLEXPEN) 100 UNIT/ML FlexPen Inject 18 Units into the skin 3 (three) times daily with meals. (Patient not taking: No sig reported)    insulin glargine (LANTUS SOLOSTAR) 100 UNIT/ML Solostar Pen Inject 76 Units into the skin daily.    Insulin Pen Needle 32G X 4 MM MISC USE AS DIRECTED TO  INJECT DIABETES MEDICATION 5 TIMES DAILY.    latanoprost (XALATAN) 0.005 % ophthalmic solution Place 1 drop into both eyes at bedtime. (Patient not taking: No sig reported)    loratadine (CLARITIN) 10 MG tablet Take 1 tablet (10 mg total) by mouth daily as needed for allergies.    losartan (COZAAR) 100 MG tablet Take 1 tablet (100 mg total) by mouth daily.    meloxicam (MOBIC) 15 MG tablet Take 1 tablet (15 mg total) by mouth daily as needed for pain. (Patient not taking: No sig reported)    metFORMIN (GLUCOPHAGE) 1000 MG tablet Take 1 tablet (1,000 mg total) by mouth 2 (two) times daily with a meal.    methocarbamol (ROBAXIN) 500 MG tablet Take 500 mg by mouth 3 (three) times daily as needed for muscle spasms. (Patient not taking: No sig reported)    Microlet Lancets MISC Use as directed    montelukast (SINGULAIR) 10 MG tablet TAKE 1 TABLET (10 MG TOTAL) BY MOUTH DAILY.    moxifloxacin (VIGAMOX) 0.5 % ophthalmic solution Place 1 drop into the right eye 4 times daily.    Multiple Vitamin (MULTIVITAMIN PO) Take 1 tablet by mouth daily.    neomycin-polymyxin-dexameth (MAXITROL) 0.1 % OINT Place 1 application into the right eye nightly. (Patient not taking: Reported on 09/11/2020)    Omega-3 Fatty Acids (FISH OIL) 1000 MG CAPS Take by mouth.    pantoprazole (PROTONIX) 40 MG tablet Take 1 tablet (40 mg total) by mouth daily. 09/11/2020: Taking differently; does not take daily   polyethylene glycol (MIRALAX / GLYCOLAX) packet Take 17 g by mouth daily as needed for mild constipation. (Patient not taking: No sig reported) 09/11/2020: Has on hand   prednisoLONE acetate (PRED FORTE) 1 % ophthalmic suspension Place 1 drop into the right eye 4 (four) times daily.    rosuvastatin (CRESTOR) 20 MG tablet Take 1 tablet (20 mg total) by mouth daily.    Semaglutide, 1 MG/DOSE, (OZEMPIC, 1 MG/DOSE,) 4 MG/3ML SOPN Inject 1 mg into the skin once a week.    ticagrelor (BRILINTA) 90 MG TABS tablet Take 1 tablet (90 mg total)  by mouth 2 (two) times daily.    No facility-administered encounter medications on file as of 10/01/2020.    Patient Active Problem List   Diagnosis Date Noted   Iron deficiency anemia 08/07/2020   Middle cerebral artery stenosis, left 07/28/2020   Mild concentric left ventricular hypertrophy (LVH) 07/24/2020   Stroke ([place]) 07/23/2020   Constipation 07/17/2020   GERD (gastroesophageal reflux disease) 06/06/2020   Healthcare maintenance 06/06/2020   High anion gap metabolic acidosis 86/16/8372   Sinus tachycardia 05/10/2020   Secondary open-angle glaucoma of both eyes, severe stage 04/25/2020   Hypertension 04/25/2020   DM (diabetes mellitus), type 2, uncontrolled with complications ([place]) 90/21/1155   Hyperlipidemia 04/25/2020   CVA (cerebral vascular accident) ([place]) 03/21/2020    Conditions to be addressed/monitored: DMII and Stroke  Care Plan : Diabetes Type 2 (Adult)  Updates made by Johnney Killian, RN since 10/01/2020 12:00 AM  Problem: Glycemic Management (Diabetes, Type 2)      Long-Range Goal: Glycemic Management Optimized   Start Date: 09/11/2020  Expected End Date: 12/10/2020  Recent Progress: On track  Priority: High  Note:   Objective:  Lab Results  Component Value Date   HGBA1C 7.7 (H) 07/24/2020   Lab Results  Component Value Date   CREATININE 1.33 (H) 07/30/2020   CREATININE 1.45 (H) 07/29/2020   CREATININE 1.03 (H) 07/28/2020   Lab Results  Component Value Date   EGFR 52 (L) 07/15/2020   Current Barriers:  Knowledge Deficits related to medications used for management of diabetes - patient working closely with pharmacist for adjustment and she had an appointment last week.  Patient complained of muscle pain after her Atorvostatin was increased from 43m to 417m  Patient was started on Rosuvastatin 2011m She did note she has had "stomach issues" for the past few days, unclear on etiology, but she thinks it may be related to food.  Morning blood  sugars range from 73-143 with her evening snack helping with hypoglycemia. Unable to independently manage needed changes to diabetes regimen without assistance from clinical team Case Manager Clinical Goal(s):  patient will demonstrate improved adherence to prescribed treatment plan for diabetes self care/management as evidenced by:  daily monitoring and recording of CBG  adherence to ADA/ carb modified diet adherence to prescribed medication regimen contacting provider for new or worsened symptoms or questions Interventions:  Collaboration with LiaIona BeardD regarding development and update of comprehensive plan of care as evidenced by provider attestation and co-signature Inter-disciplinary care team collaboration (see longitudinal plan of care) Discussed plans with patient for ongoing care management follow up and provided patient with direct contact information for care management team Reviewed scheduled/upcoming provider appointments including: upcoming call from pharmacist Advised patient, providing education and rationale, to check cbg BID and prn and record, calling PCP, Pharmacist, or RNCM for findings outside established parameters.   Referral made to community resources care guide team for assistance with transportation to appointments Review of patient status, including review of consultants reports, relevant laboratory and other test results, and medications completed. Self-Care Activities - UNABLE to independently drive to appointments - sister and friends are helping  but she needs community resources for transportation- [facility] Patient Goals: - check blood sugar at prescribed times - check blood sugar if I feel it is too high or too low - enter blood sugar readings and medication or insulin into daily log - take the blood sugar log to all doctor visits - manage portion size  - keep appointment with eye doctor  Follow Up Plan: Telephone follow up  appointment with care management team member scheduled for: October 08, 2020      Plan: Telephone follow up appointment with care management team member scheduled for:  October 08, 2020  DebJohnney KillianN, BSN, CCM Care Management Coordinator [facility] Phone: [phone]: [phone]

## 2020-10-01 NOTE — Patient Instructions (Signed)
Visit Information   Goals Addressed             This Visit's Progress    Find Help in My Community       Timeframe:  Short-Term Goal Priority:  High Start Date:       09/03/20                      Expected End Date:    11/01/20                   Follow Up Date: 10/08/20   - follow-up on any referrals for help I am given by my community resources care guide          Monitor and Manage My Blood Sugar-Diabetes Type 2       Timeframe:  Long-Range Goal Priority:  High Start Date:      09/11/20                       Expected End Date:         12/13/20              Follow Up Date 10/08/20    - check blood sugar at prescribed times - check blood sugar if I feel it is too high or too low - take the blood sugar log to all doctor visits - take the blood sugar meter to all doctor visits             The patient verbalized understanding of instructions, educational materials, and care plan provided today and declined offer to receive copy of patient instructions, educational materials, and care plan.   Telephone follow up appointment with care management team member scheduled for: 10/08/20  Johnney Killian, RN, BSN, CCM Care Management Coordinator [facility] Internal Medicine Phone: [phone] / Fax: [phone]

## 2020-10-03 ENCOUNTER — Telehealth: Payer: Self-pay

## 2020-10-03 NOTE — Telephone Encounter (Signed)
   Telephone encounter was:  Unsuccessful.  10/03/2020 Name: Sharon Blankenship MRN: [id number] DOB: [birth year]  Unsuccessful outbound call made today to assist with:  Transportation Needs   Outreach Attempt:  1st Attempt  A HIPAA compliant voice message was left requesting a return call.  Instructed patient to call back at [phone].  Damarea Merkel, AAS Paralegal, [place] [facility]  [address], [place] [address] ??[email or web address]  ?? [phone]   [email or web address]

## 2020-10-03 NOTE — Telephone Encounter (Signed)
   Telephone encounter was:  Successful.  10/03/2020 Name: Sharon Blankenship MRN: [id number] DOB: [birth year]  Sharon Blankenship is a 55 y.o. year old female who is a primary care patient of [name], MD . The community resource team was consulted for assistance with Transportation Needs   Care guide performed the following interventions: Spoke with patient gave her the contact information for [place].   .  Follow Up Plan:  Care guide will follow up with patient by phone over the next 7 days  Sharon Blankenship, AAS Paralegal, [place]  [address], [place] [address] ??[email or web address]  ?? [phone]   [email or web address]

## 2020-10-07 ENCOUNTER — Encounter: Payer: Self-pay | Admitting: *Deleted

## 2020-10-07 ENCOUNTER — Other Ambulatory Visit: Payer: Self-pay

## 2020-10-08 ENCOUNTER — Telehealth: Payer: Medicaid Other

## 2020-10-08 ENCOUNTER — Telehealth: Payer: Self-pay

## 2020-10-08 NOTE — Telephone Encounter (Signed)
  Care Management   Outreach Note  10/08/2020 Name: Sharon Blankenship MRN: [id number] DOB: 1965/06/05  Referred by: [name], MD Reason for referral : No chief complaint on file.   An unsuccessful telephone outreach was attempted today. The patient was referred to the case management team for assistance with care management and care coordination.  Attempted to call patient to see if her complaints of stomach issues have resolved.  This RNCM was also following up on if she was able to get transportation to appointments after Care Guide called her with information on Insurance risk surveyor.  Follow Up Plan: The care management team will reach out to the patient again over the next 7 days.   Johnney Killian, RN, BSN, CCM Care Management Coordinator [facility] Medicine Phone: [phone] / Fax: [phone]

## 2020-10-09 ENCOUNTER — Ambulatory Visit: Payer: Medicaid Other

## 2020-10-09 NOTE — Chronic Care Management (AMB) (Signed)
 Care Management    RN Visit Note  10/09/2020 Name: Sharon Blankenship MRN: [id number] DOB: 08/01/1965  Subjective: Sharon Blankenship is a 55 y.o. year old female who is a primary care patient of Liang, Jessica, MD. The care management team was consulted for assistance with disease management and care coordination needs.    Engaged with patient by telephone for follow up visit in response to provider referral for case management and/or care coordination services.   Consent to Services:   [name] was given information about Care Management services today including:  Care Management services includes personalized support from designated clinical staff supervised by her physician, including individualized plan of care and coordination with other care providers 24/7 contact phone numbers for assistance for urgent and routine care needs. The patient may stop case management services at any time by phone call to the office staff.  Patient agreed to services and consent obtained.   Assessment: Review of patient past medical history, allergies, medications, health status, including review of consultants reports, laboratory and other test data, was performed as part of comprehensive evaluation and provision of chronic care management services.   SDOH (Social Determinants of Health) assessments and interventions performed:    Care Plan  Allergies  Allergen Reactions   Aloe Shortness Of Breath and Rash    SOB, Swelling, Rash, Itching   Penicillins Hives, Itching and Swelling    Did it involve swelling of the face/tongue/throat, SOB, or low BP? Y Did it involve sudden or severe rash/hives, skin peeling, or any reaction on the inside of your mouth or nose? N Did you need to seek medical attention at a hospital or doctor's office? Y When did it last happen? Several Years Ago       If all above answers are "NO", may proceed with cephalosporin use.     Outpatient Encounter Medications as of 10/09/2020   Medication Sig Note   acetaZOLAMIDE (DIAMOX) 125 MG tablet Take 2 tablets (250 mg total) by mouth in the morning, at noon, in the evening, and at bedtime. (Patient not taking: No sig reported)    aspirin 81 MG EC tablet Take 1 tablet (81 mg total) by mouth daily. Swallow whole. (Patient taking differently: Take 81 mg by mouth daily.)    atropine 1 % ophthalmic solution Place 1 drop into the right eye daily.    azelastine (ASTELIN) 0.1 % nasal spray Place 1 spray into both nostrils 2 (two) times daily as needed for allergies.    b complex vitamins capsule Take 1 capsule by mouth daily.    Blood Glucose Monitoring Suppl (CONTOUR NEXT MONITOR) w/Device KIT Use as directed    brimonidine (ALPHAGAN) 0.2 % ophthalmic solution Place 1 drop into both eyes 3 (three) times daily. (Patient not taking: Reported on 08/26/2020)    dorzolamide-timolol (COSOPT) 22.3-6.8 MG/ML ophthalmic solution Place 1 drop into the right eye 2 (two) times daily.    ferrous sulfate 325 (65 FE) MG tablet Take 1 tablet (325 mg total) by mouth daily. 09/11/2020: Taking differently; not taking every day secondary to constipation   gabapentin (NEURONTIN) 100 MG capsule Take 1 capsule (100 mg total) by mouth 3 (three) times daily as needed (nerve pain).    glucose blood test strip Use as instructed    insulin aspart (NOVOLOG FLEXPEN) 100 UNIT/ML FlexPen Inject 18 Units into the skin 3 (three) times daily with meals. (Patient not taking: No sig reported)    insulin glargine (LANTUS SOLOSTAR) 100 UNIT/ML   Solostar Pen Inject 76 Units into the skin daily.    Insulin Pen Needle 32G X 4 MM MISC USE AS DIRECTED TO INJECT DIABETES MEDICATION 5 TIMES DAILY.    latanoprost (XALATAN) 0.005 % ophthalmic solution Place 1 drop into both eyes at bedtime. (Patient not taking: No sig reported)    loratadine (CLARITIN) 10 MG tablet Take 1 tablet (10 mg total) by mouth daily as needed for allergies.    losartan (COZAAR) 100 MG tablet Take 1 tablet (100 mg  total) by mouth daily.    meloxicam (MOBIC) 15 MG tablet Take 1 tablet (15 mg total) by mouth daily as needed for pain. (Patient not taking: No sig reported)    metFORMIN (GLUCOPHAGE) 1000 MG tablet Take 1 tablet (1,000 mg total) by mouth 2 (two) times daily with a meal.    methocarbamol (ROBAXIN) 500 MG tablet Take 500 mg by mouth 3 (three) times daily as needed for muscle spasms. (Patient not taking: No sig reported)    Microlet Lancets MISC Use as directed    montelukast (SINGULAIR) 10 MG tablet TAKE 1 TABLET (10 MG TOTAL) BY MOUTH DAILY.    moxifloxacin (VIGAMOX) 0.5 % ophthalmic solution Place 1 drop into the right eye 4 times daily.    Multiple Vitamin (MULTIVITAMIN PO) Take 1 tablet by mouth daily.    neomycin-polymyxin-dexameth (MAXITROL) 0.1 % OINT Place 1 application into the right eye nightly. (Patient not taking: Reported on 09/11/2020)    Omega-3 Fatty Acids (FISH OIL) 1000 MG CAPS Take by mouth.    pantoprazole (PROTONIX) 40 MG tablet Take 1 tablet (40 mg total) by mouth daily. 09/11/2020: Taking differently; does not take daily   polyethylene glycol (MIRALAX / GLYCOLAX) packet Take 17 g by mouth daily as needed for mild constipation. (Patient not taking: No sig reported) 09/11/2020: Has on hand   prednisoLONE acetate (PRED FORTE) 1 % ophthalmic suspension Place 1 drop into the right eye 4 (four) times daily.    rosuvastatin (CRESTOR) 20 MG tablet Take 1 tablet (20 mg total) by mouth daily.    Semaglutide, 1 MG/DOSE, (OZEMPIC, 1 MG/DOSE,) 4 MG/3ML SOPN Inject 1 mg into the skin once a week.    ticagrelor (BRILINTA) 90 MG TABS tablet Take 1 tablet (90 mg total) by mouth 2 (two) times daily.    No facility-administered encounter medications on file as of 10/09/2020.    Patient Active Problem List   Diagnosis Date Noted   Iron deficiency anemia 08/07/2020   Middle cerebral artery stenosis, left 07/28/2020   Mild concentric left ventricular hypertrophy (LVH) 07/24/2020   Stroke ([place])  07/23/2020   Constipation 07/17/2020   GERD (gastroesophageal reflux disease) 06/06/2020   Healthcare maintenance 06/06/2020   High anion gap metabolic acidosis 31/54/0086   Sinus tachycardia 05/10/2020   Secondary open-angle glaucoma of both eyes, severe stage 04/25/2020   Hypertension 04/25/2020   DM (diabetes mellitus), type 2, uncontrolled with complications ([place]) 76/19/5093   Hyperlipidemia 04/25/2020   CVA (cerebral vascular accident) ([place]) 03/21/2020    Conditions to be addressed/monitored: DMII and Stroke  Care Plan : Diabetes Type 2 (Adult)  Updates made by Johnney Killian, RN since 10/09/2020 12:00 AM     Problem: Glycemic Management (Diabetes, Type 2)      Long-Range Goal: Glycemic Management Optimized   Start Date: 09/11/2020  Expected End Date: 12/10/2020  Recent Progress: On track  Priority: High  Note:   Objective:  Lab Results  Component Value Date   HGBA1C  7.7 (H) 07/24/2020   Lab Results  Component Value Date   CREATININE 1.33 (H) 07/30/2020   CREATININE 1.45 (H) 07/29/2020   CREATININE 1.03 (H) 07/28/2020   Lab Results  Component Value Date   EGFR 52 (L) 07/15/2020   Current Barriers:  Knowledge Deficits related to medications used for management of diabetes - Patient notes her stomach issues have resolved and she does not feel the problems were related to her medications.  Patient is feeling well and will be coming into the clinic for follow up visit within the next 2 weeks.  Patient stated she is having difficulty with the transportation services which were provided by Care Guide because she is too young and her Medicaid is pending. Unable to independently manage needed changes to diabetes regimen without assistance from clinical team Case Manager Clinical Goal(s):  patient will demonstrate improved adherence to prescribed treatment plan for diabetes self care/management as evidenced by:  daily monitoring and recording of CBG  adherence to ADA/ carb  modified diet adherence to prescribed medication regimen contacting provider for new or worsened symptoms or questions Interventions:  Collaboration with [name], MD regarding development and update of comprehensive plan of care as evidenced by provider attestation and co-signature Inter-disciplinary care team collaboration (see longitudinal plan of care) Discussed plans with patient for ongoing care management follow up and provided patient with direct contact information for care management team Reviewed scheduled/upcoming provider appointments including: upcoming call from pharmacist Advised patient, providing education and rationale, to check cbg BID and prn and record, calling PCP, Pharmacist, or RNCM for findings outside established parameters.   Referral made to community resources care guide team for assistance with transportation to appointments Review of patient status, including review of consultants reports, relevant laboratory and other test results, and medications completed. Self-Care Activities - UNABLE to independently drive to appointments - sister and friends are helping  but she needs community resources for transportation- Referred to [facility] Guides Patient Goals: - check blood sugar at prescribed times - check blood sugar if I feel it is too high or too low - enter blood sugar readings and medication or insulin into daily log - take the blood sugar log to all doctor visits - manage portion size  - keep appointment with eye doctor  Follow Up Plan: Telephone follow up appointment with care management team member scheduled for: 11/06/2020_0       Plan: The care management team will reach out to the patient again over the next 30 days.  Johnney Killian, RN, BSN, CCM Care Management Coordinator [facility] Internal Medicine Phone: [phone] / Fax: [phone]

## 2020-10-09 NOTE — Patient Instructions (Signed)
Visit Information   Goals Addressed             This Visit's Progress    Adhere to Stroke care recommendations       Timeframe:  Long-Range Goal Priority:  High Start Date:        09/11/20                     Expected End Date:     12/13/20                  Follow Up Date 11/06/20    - eat healthy to increase strength - start a journal or diary to track progress - keep appointments with my doctor  - notify my doctor of any new or worsening symptoms          Find Help in My Community       Timeframe:  Short-Term Goal Priority:  High Start Date:       09/03/20                      Expected End Date:    01/02/21                   Follow Up Date: 12/09/20   - follow-up on any referrals for help I am given by my community resources care guide          Monitor and Manage My Blood Sugar-Diabetes Type 2       Timeframe:  Long-Range Goal Priority:  High Start Date:      09/11/20                       Expected End Date:         12/13/20              Follow Up Date 12/09/20    - check blood sugar at prescribed times - check blood sugar if I feel it is too high or too low - take the blood sugar log to all doctor visits - take the blood sugar meter to all doctor visits             The patient verbalized understanding of instructions, educational materials, and care plan provided today and declined offer to receive copy of patient instructions, educational materials, and care plan.   The care management team will reach out to the patient again over the next 30 days.   Johnney Killian, RN, BSN, CCM Care Management Coordinator [facility] Internal Medicine Phone: [phone] / Fax: [phone]

## 2020-10-14 ENCOUNTER — Telehealth: Payer: Self-pay

## 2020-10-14 ENCOUNTER — Other Ambulatory Visit: Payer: Self-pay

## 2020-10-14 NOTE — Telephone Encounter (Signed)
Let pt know her novo nordisk patient assistance meds are ready for pickup. She will try to send her sister to get them.   Meds are labeled in fridge with pen needles on counter:  Tresiba - ozempic - novolog

## 2020-10-14 NOTE — Progress Notes (Signed)
error 

## 2020-10-15 ENCOUNTER — Other Ambulatory Visit: Payer: Self-pay

## 2020-10-15 ENCOUNTER — Telehealth: Payer: Self-pay

## 2020-10-15 NOTE — Telephone Encounter (Signed)
   Telephone encounter was:  Successful.  10/15/2020 Name: Sharon Blankenship MRN: [id number] DOB: 09-07-1965  Sharon Blankenship is a 56 y.o. year old female who is a primary care patient of [name], MD . The community resource team was consulted for assistance with Spoke with patient verified number for [employer] and [employer].  She stated she would call them this week, she transposed the number  Care guide performed the following interventions: Follow up call placed to the patient to discuss status of referral.  Follow Up Plan:  No further follow up planned at this time. The patient has been provided with needed resources.  Zykerria Tanton, AAS Paralegal, [place] [facility]  [address], [place] [address] ??[email or web address]  ?? [phone]   [email or web address]

## 2020-10-15 NOTE — Telephone Encounter (Signed)
Pt's sister, Christal, picked up tresiba, ozempic, novolog and pen needles.

## 2020-10-22 ENCOUNTER — Other Ambulatory Visit (HOSPITAL_COMMUNITY): Payer: Self-pay

## 2020-10-22 ENCOUNTER — Other Ambulatory Visit: Payer: Self-pay

## 2020-10-23 ENCOUNTER — Other Ambulatory Visit: Payer: Self-pay

## 2020-10-24 ENCOUNTER — Other Ambulatory Visit: Payer: Self-pay

## 2020-10-31 ENCOUNTER — Other Ambulatory Visit: Payer: Self-pay

## 2020-11-03 ENCOUNTER — Other Ambulatory Visit: Payer: Self-pay

## 2020-11-04 ENCOUNTER — Other Ambulatory Visit: Payer: Self-pay

## 2020-11-06 ENCOUNTER — Telehealth: Payer: Self-pay

## 2020-11-06 ENCOUNTER — Telehealth: Payer: Medicaid Other

## 2020-11-06 NOTE — Telephone Encounter (Signed)
  Care Management   Outreach Note  11/06/2020 Name: Sharon Blankenship MRN: [id number] DOB: [birth year]  Referred by: [name], MD Reason for referral : No chief complaint on file.   An unsuccessful telephone outreach was attempted today. The patient was referred to the case management team for assistance with care management and care coordination.   Follow Up Plan: If patient returns call to provider office, please advise to call [place] at [phone].  Johnney Killian, RN, BSN, CCM Care Management Coordinator [facility] Internal Medicine Phone: [phone] / Fax: [phone]

## 2020-11-21 ENCOUNTER — Other Ambulatory Visit: Payer: Self-pay

## 2020-12-02 ENCOUNTER — Other Ambulatory Visit: Payer: Self-pay

## 2020-12-03 ENCOUNTER — Other Ambulatory Visit: Payer: Self-pay

## 2020-12-04 ENCOUNTER — Ambulatory Visit: Payer: Medicaid Other

## 2020-12-04 ENCOUNTER — Other Ambulatory Visit: Payer: Self-pay

## 2020-12-04 ENCOUNTER — Other Ambulatory Visit: Payer: Self-pay | Admitting: Student

## 2020-12-04 MED ORDER — BRIMONIDINE TARTRATE 0.2 % OP SOLN
1.0000 [drp] | Freq: Three times a day (TID) | OPHTHALMIC | 4 refills | Status: DC
  Filled 2020-12-04: qty 5, 13d supply, fill #0
  Filled 2020-12-22: qty 5, 13d supply, fill #1

## 2020-12-04 NOTE — Chronic Care Management (AMB) (Signed)
Care Management    RN Visit Note  12/04/2020 Name: Sharon Blankenship MRN: [id number] DOB: [birth year]  Subjective: Sharon Blankenship is a 55 y.o. year old female who is a primary care patient of Sharon Beard, MD. The care management team was consulted for assistance with disease management and care coordination needs.    Engaged with patient by telephone for follow up visit in response to provider referral for case management and/or care coordination services.   Consent to Services:   Sharon Blankenship was given information about Care Management services today including:  Care Management services includes personalized support from designated clinical staff supervised by her physician, including individualized plan of care and coordination with other care providers 24/7 contact phone numbers for assistance for urgent and routine care needs. The patient may stop case management services at any time by phone call to the office staff.  Patient agreed to services and consent obtained.   Assessment: Review of patient past medical history, allergies, medications, health status, including review of consultants reports, laboratory and other test data, was performed as part of comprehensive evaluation and provision of chronic care management services.   SDOH (Social Determinants of Health) assessments and interventions performed:    Care Plan  Allergies  Allergen Reactions   Aloe Shortness Of Breath and Rash    SOB, Swelling, Rash, Itching   Penicillins Hives, Itching and Swelling    Did it involve swelling of the face/tongue/throat, SOB, or low BP? Y Did it involve sudden or severe rash/hives, skin peeling, or any reaction on the inside of your mouth or nose? N Did you need to seek medical attention at a hospital or doctor's office? Y When did it last happen? Several Years Ago       If all above answers are "NO", may proceed with cephalosporin use.     Outpatient Encounter Medications as of 12/04/2020   Medication Sig Note   acetaZOLAMIDE (DIAMOX) 125 MG tablet Take 2 tablets (250 mg total) by mouth in the morning, at noon, in the evening, and at bedtime. (Patient not taking: No sig reported)    aspirin 81 MG EC tablet Take 1 tablet (81 mg total) by mouth daily. Swallow whole. (Patient taking differently: Take 81 mg by mouth daily.)    atropine 1 % ophthalmic solution Place 1 drop into the right eye daily.    azelastine (ASTELIN) 0.1 % nasal spray Place 1 spray into both nostrils 2 (two) times daily as needed for allergies.    b complex vitamins capsule Take 1 capsule by mouth daily.    Blood Glucose Monitoring Suppl (CONTOUR NEXT MONITOR) w/Device KIT Use as directed    brimonidine (ALPHAGAN) 0.2 % ophthalmic solution Place 1 drop into both eyes 3 (three) times daily.    dorzolamide-timolol (COSOPT) 22.3-6.8 MG/ML ophthalmic solution Place 1 drop into the right eye 2 (two) times daily.    ferrous sulfate 325 (65 FE) MG tablet Take 1 tablet (325 mg total) by mouth daily.    gabapentin (NEURONTIN) 100 MG capsule Take 1 capsule (100 mg total) by mouth 3 (three) times daily as needed (nerve pain).    glucose blood test strip Use as instructed    insulin aspart (NOVOLOG FLEXPEN) 100 UNIT/ML FlexPen Inject 18 Units into the skin 3 (three) times daily with meals. (Patient not taking: No sig reported)    insulin glargine (LANTUS SOLOSTAR) 100 UNIT/ML Solostar Pen Inject 76 Units into the skin daily.    Insulin Pen Needle  32G X 4 MM MISC USE AS DIRECTED TO INJECT DIABETES MEDICATION 5 TIMES DAILY.    latanoprost (XALATAN) 0.005 % ophthalmic solution Place 1 drop into both eyes at bedtime. (Patient not taking: No sig reported)    loratadine (CLARITIN) 10 MG tablet Take 1 tablet (10 mg total) by mouth daily as needed for allergies.    losartan (COZAAR) 100 MG tablet Take 1 tablet (100 mg total) by mouth daily.    meloxicam (MOBIC) 15 MG tablet Take 1 tablet (15 mg total) by mouth daily as needed for pain.  (Patient not taking: No sig reported)    metFORMIN (GLUCOPHAGE) 1000 MG tablet Take 1 tablet (1,000 mg total) by mouth 2 (two) times daily with a meal.    methocarbamol (ROBAXIN) 500 MG tablet Take 500 mg by mouth 3 (three) times daily as needed for muscle spasms. (Patient not taking: No sig reported)    Microlet Lancets MISC Use as directed    montelukast (SINGULAIR) 10 MG tablet TAKE 1 TABLET (10 MG TOTAL) BY MOUTH DAILY.    moxifloxacin (VIGAMOX) 0.5 % ophthalmic solution Place 1 drop into the right eye 4 times daily.    Multiple Vitamin (MULTIVITAMIN PO) Take 1 tablet by mouth daily.    neomycin-polymyxin-dexameth (MAXITROL) 0.1 % OINT Place 1 application into the right eye nightly. (Patient not taking: Reported on 09/11/2020)    Omega-3 Fatty Acids (FISH OIL) 1000 MG CAPS Take by mouth.    pantoprazole (PROTONIX) 40 MG tablet Take 1 tablet (40 mg total) by mouth daily. 09/11/2020: Taking differently; does not take daily   polyethylene glycol (MIRALAX / GLYCOLAX) packet Take 17 g by mouth daily as needed for mild constipation. (Patient not taking: No sig reported) 09/11/2020: Has on hand   prednisoLONE acetate (PRED FORTE) 1 % ophthalmic suspension Place 1 drop into the right eye 4 (four) times daily.    rosuvastatin (CRESTOR) 20 MG tablet Take 1 tablet (20 mg total) by mouth daily.    ticagrelor (BRILINTA) 90 MG TABS tablet Take 1 tablet (90 mg total) by mouth 2 (two) times daily.    No facility-administered encounter medications on file as of 12/04/2020.    Patient Active Problem List   Diagnosis Date Noted   Iron deficiency anemia 08/07/2020   Middle cerebral artery stenosis, left 07/28/2020   Mild concentric left ventricular hypertrophy (LVH) 07/24/2020   Stroke ([place]) 07/23/2020   Constipation 07/17/2020   GERD (gastroesophageal reflux disease) 06/06/2020   Healthcare maintenance 06/06/2020   High anion gap metabolic acidosis 15/08/6977   Sinus tachycardia 05/10/2020   Secondary  open-angle glaucoma of both eyes, severe stage 04/25/2020   Hypertension 04/25/2020   DM (diabetes mellitus), type 2, uncontrolled with complications ([place]) 48/04/6551   Hyperlipidemia 04/25/2020   CVA (cerebral vascular accident) ([place]) 03/21/2020    Conditions to be addressed/monitored: HTN and DMII  Care Plan : Diabetes Type 2 (Adult)  Updates made by Johnney Killian, RN since 12/04/2020 12:00 AM     Problem: Glycemic Management (Diabetes, Type 2)      Long-Range Goal: Glycemic Management Optimized   Start Date: 09/11/2020  Expected End Date: 12/10/2020  Recent Progress: On track  Priority: High  Note:   Objective: Successful outreach to patient this afternoon.  Patient was concerned she needed a refill on her eyedrops, Brimonidine (ALPHAGAN 2%) one drop 3x day and she had to renew her orange card, so she is worried she would have to pay full price for the drops.  This RNCM sent a message to Dr. Lisabeth Devoid to see if she could approve refill on the drops.  I called [employer] and was unable to reach anyone to discuss.  Collaborated with Hughes Better, PharmD to discuss patients concerns.  Dr. Georgina Peer noted that the orange card is not considered insurance, so the pharmacy would consider the patient an uninsured patient and the maximum she would pay for the drops is $10.00.  Placed return call to patient and explained the maximum cost of the drops and that her refill was pending MD approval.  Informed patient that she is due for a follow up appointment in [facility] and she will call for appointment. Lab Results  Component Value Date   HGBA1C 7.7 (H) 07/24/2020   Lab Results  Component Value Date   CREATININE 1.33 (H) 07/30/2020   CREATININE 1.45 (H) 07/29/2020   CREATININE 1.03 (H) 07/28/2020   Lab Results  Component Value Date   EGFR 52 (L) 07/15/2020   Current Barriers:  Knowledge Deficits related to medications used for management of diabetes -  Unable to independently  manage needed changes to diabetes regimen without assistance from clinical team Case Manager Clinical Goal(s):  patient will demonstrate improved adherence to prescribed treatment plan for diabetes self care/management as evidenced by:  daily monitoring and recording of CBG  adherence to ADA/ carb modified diet adherence to prescribed medication regimen contacting provider for new or worsened symptoms or questions Interventions:  Collaboration with Sharon Beard, MD regarding development and update of comprehensive plan of care as evidenced by provider attestation and co-signature Inter-disciplinary care team collaboration (see longitudinal plan of care) Discussed plans with patient for ongoing care management follow up and provided patient with direct contact information for care management team Reviewed scheduled/upcoming provider appointments including: upcoming call from pharmacist Advised patient, providing education and rationale, to check cbg BID and prn and record, calling PCP, Pharmacist, or RNCM for findings outside established parameters.   Referral made to community resources care guide team for assistance with transportation to appointments Review of patient status, including review of consultants reports, relevant laboratory and other test results, and medications completed. Self-Care Activities - UNABLE to independently drive to appointments - sister and friends are helping  but she needs community resources for transportation- Referred to [facility] Guides Patient Goals: - check blood sugar at prescribed times - check blood sugar if I feel it is too high or too low - enter blood sugar readings and medication or insulin into daily log - take the blood sugar log to all doctor visits - manage portion size  - keep appointment with eye doctor  Follow Up Plan: Telephone follow up appointment with care management team member scheduled for: 11/06/2020@0930      Care Plan : Stroke  (Adult)  Updates made by Johnney Killian, RN since 12/04/2020 12:00 AM     Problem: Recurrence (CVA x 2 - December/April)   Priority: High     Long-Range Goal: Stroke Recurrence Prevented or Minimized   Start Date: 09/11/2020  Expected End Date: 11/10/2020  Recent Progress: On track  Priority: Medium  Note:   Current Barriers:  Self Health Maintenance related to recent CVA - patient has had 2 strokes in the last 6 months; she is seeing her primary care provider routinely; being [facility] by ophthalmology at [facility] although for Glaucoma rather than vision changes resulting from Stroke; taking medications as prescribed; checking bp twice weekly Unable to independently monitor as a high recurrent  stroke risk patient; needs ongoing close monitoring at primary care level Clinical Goal(s):  Collaboration with Sharon Beard, MD regarding development and update of comprehensive plan of care as evidenced by provider attestation and co-signature Inter-disciplinary care team collaboration (see longitudinal plan of care) patient will work with care management team to address care coordination and chronic disease management needs related to Disease Management Educational Needs - provided high level assessment and overview of stroke symptoms and management; advised to maintain provider appointments and call for new/worsening symptoms Care Coordination - community resources care guide actively assisting patient with transportation resources and Meals on Wheels application Medication Assistance - Hughes Better PharmD assisting with medication needs Interventions:  Evaluation of current treatment plan related to  CVA , Financial constraints related to Medicaid Status, food and Transportation self-management and patient's adherence to plan as established by provider. Provided education re: close monitoring of symptoms and reporting as new symptoms or concerns are noted Collaboration with Sharon Beard, MD  regarding development and update of comprehensive plan of care as evidenced by provider attestation       and co-signaturePatient verbalizes understanding of plan to work with care management team Inter-disciplinary care team collaboration (see longitudinal plan of care) Discussed plans with patient for ongoing care management follow up and provided patient with direct contact information for care management team Self Care Activities:  Patient verbalizes understanding of plan to work with care management team in collaboration with PCP for chronic health needs Self administers medications as prescribed Attends all scheduled provider appointments Calls provider office for new concerns or questions Patient Goals: - eat healthy to increase strength - start a journal or diary to track progress Follow Up Plan: Telephone follow up appointment with care management team member scheduled for: 30 days.     Plan: Telephone follow up appointment with care management team member scheduled for:  24 days  Johnney Killian, RN, BSN, CCM Care Management Coordinator [facility] Internal Medicine Phone: [phone] / Fax: [phone]

## 2020-12-04 NOTE — Patient Instructions (Signed)
Visit Information   Goals Addressed             This Visit's Progress    Adhere to Stroke care recommendations       Timeframe:  Long-Range Goal Priority:  High Start Date:        09/11/20                     Expected End Date:                       Follow Up Date 01/02/21    - eat healthy to increase strength - start a journal or diary to track progress - keep appointments with my doctor  - notify my doctor of any new or worsening symptoms         Find Help in My Community       Timeframe:  Short-Term Goal Priority:  High Start Date:       09/03/20                      Expected End Date:                    Follow Up Date: 01/02/21   - follow-up on any referrals for help I am given by my community resources care guide         Monitor and Manage My Blood Sugar-Diabetes Type 2       Timeframe:  Long-Range Goal Priority:  High Start Date:      09/11/20                       Expected End Date:                       Follow Up Date 01/02/21    - check blood sugar at prescribed times - check blood sugar if I feel it is too high or too low - take the blood sugar log to all doctor visits - take the blood sugar meter to all doctor visits            The patient verbalized understanding of instructions, educational materials, and care plan provided today and declined offer to receive copy of patient instructions, educational materials, and care plan.   Telephone follow up appointment with care management team member scheduled for: 01/01/21'@2'$ :00 PM  Johnney Killian, RN, BSN, CCM Care Management Coordinator [facility] Internal Medicine Phone: [phone] / Fax: [phone]

## 2020-12-22 ENCOUNTER — Other Ambulatory Visit: Payer: Self-pay | Admitting: Student

## 2020-12-22 ENCOUNTER — Other Ambulatory Visit: Payer: Self-pay

## 2020-12-22 NOTE — Telephone Encounter (Signed)
Pharmacy comment: Patient doesnt have ins anymore could this be changed to the meter, strips and lancets we have which are [employer] brand. She will need nre rx's for each supply

## 2020-12-23 MED ORDER — TRUE METRIX METER W/DEVICE KIT: 1.0000 [IU] | PACK | Freq: Three times a day (TID) | 0 refills | Status: AC

## 2020-12-23 MED ORDER — LANCETS 33G MISC: 1.0000 [IU] | Freq: Three times a day (TID) | 3 refills | Status: AC

## 2020-12-23 MED ORDER — TRUE METRIX BLOOD GLUCOSE TEST VI STRP: ORAL_STRIP | 12 refills | Status: AC

## 2020-12-31 ENCOUNTER — Other Ambulatory Visit: Payer: Self-pay

## 2021-01-01 ENCOUNTER — Telehealth: Payer: Self-pay

## 2021-01-01 ENCOUNTER — Ambulatory Visit: Payer: Medicaid Other

## 2021-01-01 NOTE — Telephone Encounter (Addendum)
  Care Management   Outreach Note  01/01/2021 Name: Sharon Blankenship MRN: [id number] DOB: 29-Jun-1965  Referred by: [name], MD Reason for referral : No chief complaint on file.   An unsuccessful telephone outreach was attempted today. The patient was referred to the case management team for assistance with care management and care coordination.   Follow Up Plan: If patient returns call to provider office, please advise to call [place] at [phone] to reschedule appointment.  Johnney Killian, RN, BSN, CCM Care Management Coordinator [facility] Internal Medicine Phone: [phone] / Fax: [phone]

## 2021-01-02 ENCOUNTER — Telehealth: Payer: Self-pay | Admitting: *Deleted

## 2021-01-02 NOTE — Chronic Care Management (AMB) (Addendum)
  Care Management   Outreach Note  01/09/2021 Name: Sharon Blankenship MRN: [id number] DOB: 1965/06/22  Referred by: [name], MD Reason for referral : No chief complaint on file.   An unsuccessful telephone outreach was attempted today. The patient was referred to the case management team for assistance with care management and care coordination.   Follow Up Plan: If patient returns call to provider office, please advise to call [place] at [phone].  Johnney Killian, RN, BSN, CCM Care Management Coordinator [facility] Internal Medicine Phone: [phone] / Fax: [phone]

## 2021-01-02 NOTE — Chronic Care Management (AMB) (Signed)
  Care Management   Note  01/02/2021 Name: Marvia Troost MRN: [id number] DOB: [birth year]  Sharon Blankenship is a 55 y.o. year old female who is a primary care patient of [name], MD and is actively engaged with the care management team. I reached out to Mellissa Kohut by phone today to assist with re-scheduling a follow up visit with the RN Case Manager  Follow up plan: Unsuccessful telephone outreach attempt made. A HIPAA compliant phone message was left for the patient providing contact information and requesting a return call. The care management team will reach out to the patient again over the next 7-12 days. If patient returns call to provider office, please advise to call [place] at [phone].  [place] [facility]  Direct Dial: [phone]

## 2021-01-05 ENCOUNTER — Other Ambulatory Visit: Payer: Self-pay

## 2021-01-06 ENCOUNTER — Telehealth: Payer: Self-pay

## 2021-01-06 NOTE — Telephone Encounter (Signed)
Attempted to contact pt twice regarding medication pickup (no voicemail, phone line picks up but no one answers).  PAP medications tresiba & novolog are ready & labeled in med room fridge.

## 2021-01-08 DIAGNOSIS — Z0271 Encounter for disability determination: Secondary | ICD-10-CM

## 2021-01-13 NOTE — Chronic Care Management (AMB) (Signed)
  Care Management   Note  01/13/2021 Name: Sharon Blankenship MRN: [id number] DOB: 1965-11-28  Sharon Blankenship is a 55 y.o. year old female who is a primary care patient of [name], MD and is actively engaged with the care management team. I reached out to Mellissa Kohut by phone today to assist with re-scheduling a follow up visit with the RN Case Manager  Follow up plan: A second unsuccessful telephone outreach attempt made. The care management team will reach out to the patient again over the next 7 days. If patient returns call to provider office, please advise to call [place]  at [phone].  [place] [facility]  Direct Dial: [phone]

## 2021-01-20 ENCOUNTER — Ambulatory Visit: Payer: Medicaid Other

## 2021-01-20 ENCOUNTER — Ambulatory Visit: Payer: Self-pay | Admitting: Neurology

## 2021-01-20 NOTE — Patient Instructions (Signed)
Visit Information   Goals Addressed             This Visit's Progress    COMPLETED: Adhere to Stroke care recommendations       Timeframe:  Long-Range Goal Priority:  High Start Date:        09/11/20                     Expected End Date:     12/13/20                  Follow Up Date 01/02/21    - eat healthy to increase strength - start a journal or diary to track progress - keep appointments with my doctor  - notify my doctor of any new or worsening symptoms         COMPLETED: Find Help in My Community       Timeframe:  Short-Term Goal Priority:  High Start Date:       09/03/20                      Expected End Date:    01/02/21                   Follow Up Date: 01/02/21   - follow-up on any referrals for help I am given by my community resources care guide         COMPLETED: Monitor and Manage My Blood Sugar-Diabetes Type 2       Timeframe:  Long-Range Goal Priority:  High Start Date:      09/11/20                       Expected End Date:         12/13/20              Follow Up Date 01/02/21    - check blood sugar at prescribed times - check blood sugar if I feel it is too high or too low - take the blood sugar log to all doctor visits - take the blood sugar meter to all doctor visits            The patient verbalized understanding of instructions, educational materials, and care plan provided today and declined offer to receive copy of patient instructions, educational materials, and care plan.   The patient has been provided with contact information for the care management team and has been advised to call with any health related questions or concerns.   Johnney Killian, RN, BSN, CCM Care Management Coordinator [facility] Internal Medicine Phone: [phone] / Fax: [phone]

## 2021-01-20 NOTE — Chronic Care Management (AMB) (Signed)
  Care Management   Note  01/20/2021 Name: Kalin Amrhein MRN: [id number] DOB: [birth year]  Amory Zbikowski is a 55 y.o. year old female who is a primary care patient of [name], MD and is actively engaged with the care management team. I reached out to Mellissa Kohut by phone today to assist with re-scheduling a follow up visit with the RN Case Manager  Follow up plan: Telephone appointment with care management team member scheduled for:01/20/21  [place] Management  Direct Dial: [phone]

## 2021-01-20 NOTE — Chronic Care Management (AMB) (Addendum)
Care Management    RN Visit Note  01/20/2021 Name: Sharon Blankenship MRN: [id number] DOB: 1965-12-08  Subjective: Sharon Blankenship is a 55 y.o. year old female who is a primary care patient of Sharon Simmonds, MD. The care management team was consulted for assistance with disease management and care coordination needs.    Engaged with patient by telephone for follow up visit in response to provider referral for case management and/or care coordination services.   Consent to Services:   Ms. Crumbley was given information about Care Management services today including:  Care Management services includes personalized support from designated clinical staff supervised by her physician, including individualized plan of care and coordination with other care providers 24/7 contact phone numbers for assistance for urgent and routine care needs. The patient may stop case management services at any time by phone call to the office staff.  Patient agreed to services and consent obtained.   Assessment: Review of patient past medical history, allergies, medications, health status, including review of consultants reports, laboratory and other test data, was performed as part of comprehensive evaluation and provision of chronic care management services.   SDOH (Social Determinants of Health) assessments and interventions performed:    Care Plan  Allergies  Allergen Reactions   Aloe Shortness Of Breath and Rash    SOB, Swelling, Rash, Itching   Penicillins Hives, Itching and Swelling    Did it involve swelling of the face/tongue/throat, SOB, or low BP? Y Did it involve sudden or severe rash/hives, skin peeling, or any reaction on the inside of your mouth or nose? N Did you need to seek medical attention at a hospital or doctor's office? Y When did it last happen? Several Years Ago       If all above answers are "NO", may proceed with cephalosporin use.     Outpatient Encounter Medications as of  01/20/2021  Medication Sig Note   acetaZOLAMIDE (DIAMOX) 125 MG tablet Take 2 tablets (250 mg total) by mouth in the morning, at noon, in the evening, and at bedtime. (Patient not taking: No sig reported)    aspirin 81 MG EC tablet Take 1 tablet (81 mg total) by mouth daily. Swallow whole. (Patient taking differently: Take 81 mg by mouth daily.)    atropine 1 % ophthalmic solution Place 1 drop into the right eye daily.    azelastine (ASTELIN) 0.1 % nasal spray Place 1 spray into both nostrils 2 (two) times daily as needed for allergies.    b complex vitamins capsule Take 1 capsule by mouth daily.    Blood Glucose Monitoring Suppl (TRUE METRIX METER) w/Device KIT 1 Units by Does not apply route 4 (four) times daily -  with meals and at bedtime.    brimonidine (ALPHAGAN) 0.2 % ophthalmic solution Place 1 drop into both eyes 3 (three) times daily.    dorzolamide-timolol (COSOPT) 22.3-6.8 MG/ML ophthalmic solution Place 1 drop into the right eye 2 (two) times daily.    ferrous sulfate 325 (65 FE) MG tablet Take 1 tablet (325 mg total) by mouth daily. 09/11/2020: Taking differently; not taking every day secondary to constipation   gabapentin (NEURONTIN) 100 MG capsule Take 1 capsule (100 mg total) by mouth 3 (three) times daily as needed (nerve pain).    glucose blood (TRUE METRIX BLOOD GLUCOSE TEST) test strip Use as instructed    insulin aspart (NOVOLOG FLEXPEN) 100 UNIT/ML FlexPen Inject 18 Units into the skin 3 (three) times daily with meals. (Patient  not taking: No sig reported)    insulin glargine (LANTUS SOLOSTAR) 100 UNIT/ML Solostar Pen Inject 76 Units into the skin daily.    Insulin Pen Needle 32G X 4 MM MISC USE AS DIRECTED TO INJECT DIABETES MEDICATION 5 TIMES DAILY.    Lancets 33G MISC 1 Units by Does not apply route 4 (four) times daily -  with meals and at bedtime.    latanoprost (XALATAN) 0.005 % ophthalmic solution Place 1 drop into both eyes at bedtime. (Patient not taking: No sig  reported)    loratadine (CLARITIN) 10 MG tablet Take 1 tablet (10 mg total) by mouth daily as needed for allergies.    losartan (COZAAR) 100 MG tablet Take 1 tablet (100 mg total) by mouth daily.    meloxicam (MOBIC) 15 MG tablet Take 1 tablet (15 mg total) by mouth daily as needed for pain. (Patient not taking: No sig reported)    metFORMIN (GLUCOPHAGE) 1000 MG tablet Take 1 tablet (1,000 mg total) by mouth 2 (two) times daily with a meal.    methocarbamol (ROBAXIN) 500 MG tablet Take 500 mg by mouth 3 (three) times daily as needed for muscle spasms. (Patient not taking: No sig reported)    Microlet Lancets MISC Use as directed    montelukast (SINGULAIR) 10 MG tablet TAKE 1 TABLET (10 MG TOTAL) BY MOUTH DAILY.    moxifloxacin (VIGAMOX) 0.5 % ophthalmic solution Place 1 drop into the right eye 4 times daily.    Multiple Vitamin (MULTIVITAMIN PO) Take 1 tablet by mouth daily.    neomycin-polymyxin-dexameth (MAXITROL) 0.1 % OINT Place 1 application into the right eye nightly. (Patient not taking: Reported on 09/11/2020)    Omega-3 Fatty Acids (FISH OIL) 1000 MG CAPS Take by mouth.    pantoprazole (PROTONIX) 40 MG tablet Take 1 tablet (40 mg total) by mouth daily. 09/11/2020: Taking differently; does not take daily   polyethylene glycol (MIRALAX / GLYCOLAX) packet Take 17 g by mouth daily as needed for mild constipation. (Patient not taking: No sig reported) 09/11/2020: Has on hand   prednisoLONE acetate (PRED FORTE) 1 % ophthalmic suspension Place 1 drop into the right eye 4 (four) times daily.    rosuvastatin (CRESTOR) 20 MG tablet Take 1 tablet (20 mg total) by mouth daily.    ticagrelor (BRILINTA) 90 MG TABS tablet Take 1 tablet (90 mg total) by mouth 2 (two) times daily.    No facility-administered encounter medications on file as of 01/20/2021.    Patient Active Problem List   Diagnosis Date Noted   Iron deficiency anemia 08/07/2020   Middle cerebral artery stenosis, left 07/28/2020   Mild  concentric left ventricular hypertrophy (LVH) 07/24/2020   Stroke (HCC) 07/23/2020   Constipation 07/17/2020   GERD (gastroesophageal reflux disease) 06/06/2020   Healthcare maintenance 06/06/2020   High anion gap metabolic acidosis 06/06/2020   Sinus tachycardia 05/10/2020   Secondary open-angle glaucoma of both eyes, severe stage 04/25/2020   Hypertension 04/25/2020   DM (diabetes mellitus), type 2, uncontrolled with complications 04/25/2020   Hyperlipidemia 04/25/2020   CVA (cerebral vascular accident) (HCC) 03/21/2020    Conditions to be addressed/monitored: HTN, DMII, and History of CVA  Care Plan : Diabetes Type 2 (Adult)  Updates made by Jodelle Gross, RN since 01/20/2021 12:00 AM     Problem: Glycemic Management (Diabetes, Type 2)      Long-Range Goal: Glycemic Management Optimized   Start Date: 09/11/2020  Expected End Date: 12/10/2020  Recent Progress: On  track  Priority: High  Note:   Objective: Successful outreach to patient this afternoon.  Patient is concerned that she has had episodes of lightheadedness and coldness when she takes her Brilinta 90 mg in the morning and evening. She often has to lay down for an hour when she has an episode. Patient denies any bleeding episodes. This RNCM inquired if she checks her CBG readings when she is having an episode.  Patient stated her CBG this morning was 79 but the other day when she had the episode it was 106.  Patient shared that she has moved to [place], [place] and has no way to get back to [facility] for a visit.  She has an appointment with [facility] next week and she has to find a new neurologist.  Advised patient if she should contact her new practice to get in sooner or go to an immediate care/ED if she is concerned about the episodes.  Care coordination case closed as patient moved out of area. Lab Results  Component Value Date   HGBA1C 7.7 (H) 07/24/2020   Lab Results  Component Value Date   CREATININE  1.33 (H) 07/30/2020   CREATININE 1.45 (H) 07/29/2020   CREATININE 1.03 (H) 07/28/2020   Lab Results  Component Value Date   EGFR 52 (L) 07/15/2020   Current Barriers:  Knowledge Deficits related to medications used for management of diabetes -  Unable to independently manage needed changes to diabetes regimen without assistance from clinical team Case Manager Clinical Goal(s):  patient will demonstrate improved adherence to prescribed treatment plan for diabetes self care/management as evidenced by:  daily monitoring and recording of CBG  adherence to ADA/ carb modified diet adherence to prescribed medication regimen contacting provider for new or worsened symptoms or questions Interventions:  Collaboration with Sharon Simmonds, MD regarding development and update of comprehensive plan of care as evidenced by provider attestation and co-signature Inter-disciplinary care team collaboration (see longitudinal plan of care) Discussed plans with patient for ongoing care management follow up and provided patient with direct contact information for care management team Reviewed scheduled/upcoming provider appointments including: upcoming call from pharmacist Advised patient, providing education and rationale, to check cbg BID and prn and record, calling PCP, Pharmacist, or RNCM for findings outside established parameters.   Referral made to community resources care guide team for assistance with transportation to appointments Review of patient status, including review of consultants reports, relevant laboratory and other test results, and medications completed. Self-Care Activities - UNABLE to independently drive to appointments - sister and friends are helping  but she needs community resources for transportation- Referred to [facility] Guides Patient Goals: - check blood sugar at prescribed times - check blood sugar if I feel it is too high or too low - enter blood sugar readings and  medication or insulin into daily log - take the blood sugar log to all doctor visits - manage portion size  - keep appointment with eye doctor  Follow Up Plan: Telephone follow up appointment with care management team member scheduled for: Closing patient case as she has moved out of town.      Plan: The patient has been provided with contact information for the care management team and has been advised to call with any health related questions or concerns.   Jodelle Gross, RN, BSN, CCM Care Management Coordinator [facility] Phone: [phone] / Fax: [phone]

## 2021-01-27 ENCOUNTER — Other Ambulatory Visit: Payer: Self-pay

## 2021-01-27 ENCOUNTER — Other Ambulatory Visit (HOSPITAL_COMMUNITY): Payer: Self-pay

## 2021-02-02 NOTE — Telephone Encounter (Signed)
SPOKE W/ PT REGARDING MEDICATION PICKUP AT OFFICE & FOLLOWED UP ON SHIPMENT TO HER HOME.  PT RECENTLY MOVED ([address] [place] [address])  WILL CANCEL NOVO [employer] AUTOFILL (TRESIBA & NOVOLOG) THAT IS SHIPPED TO OFFICE. SISTER WILL P/U SHIPMENT THAT IS STILL READY. PT WOULD STILL LIKE BRILINTA SHIPPED TO HOME FROM [place].  SHE ALSO NOW HAS MEDICAID BUT NO INCOME AND WOULD LIKE TO REMAIN ON PATIENT ASSISTANCE FOR AS LONG AS POSSIBLE. HOWEVER SHE IS AWARE THAT IF SHE RE-ENROLLS AND CHANGES PCP, NEW OFFICE WILL BE RESPONSIBLE FOR NEW ENROLLMENT.

## 2021-03-24 NOTE — Progress Notes (Signed)
Meds never picked up.  Used last boxes of tresiba and novolog as samples for the clinic.   See previous note from 01/06/21.

## 2021-03-24 NOTE — Progress Notes (Signed)
Received notification from [place] regarding approval for [place]. Patient assistance approved from 09/17/20 to 09/17/21.  MEDICATION IS ON AUTOFILL & WILL SHIP TO PT'S HOME.  *LATE UPDATE: PT HAS MEDICAID BUT WOULD LIKE TO STAY ON ASSISTANCE AS LONG AS POSSIBLE. PT ALSO UPDATED ADDR FOR SHIPMENT TO [place] [id number]

## 2021-04-08 ENCOUNTER — Other Ambulatory Visit: Payer: Self-pay | Admitting: Student

## 2022-10-11 ENCOUNTER — Telehealth: Payer: Self-pay | Admitting: Neurology

## 2023-09-30 ENCOUNTER — Encounter (HOSPITAL_COMMUNITY): Payer: Self-pay | Admitting: Interventional Radiology
